# Patient Record
Sex: Male | Born: 1960 | Race: White | Hispanic: No | Marital: Single
Health system: Southern US, Academic
[De-identification: ages and names within clinical notes are randomized; demographics above are authoritative.]

## PROBLEM LIST (undated history)

## (undated) ENCOUNTER — Encounter
Attending: Student in an Organized Health Care Education/Training Program | Primary: Student in an Organized Health Care Education/Training Program

## (undated) ENCOUNTER — Ambulatory Visit: Payer: PRIVATE HEALTH INSURANCE | Attending: Adult Health | Primary: Adult Health

## (undated) ENCOUNTER — Ambulatory Visit

## (undated) ENCOUNTER — Encounter: Attending: Hematology & Oncology | Primary: Hematology & Oncology

## (undated) ENCOUNTER — Encounter

## (undated) ENCOUNTER — Encounter: Attending: Radiation Oncology | Primary: Radiation Oncology

## (undated) ENCOUNTER — Telehealth: Attending: Hematology & Oncology | Primary: Hematology & Oncology

## (undated) ENCOUNTER — Telehealth

## (undated) ENCOUNTER — Ambulatory Visit
Payer: Medicaid (Managed Care) | Attending: Student in an Organized Health Care Education/Training Program | Primary: Student in an Organized Health Care Education/Training Program

## (undated) ENCOUNTER — Encounter: Attending: Internal Medicine | Primary: Internal Medicine

## (undated) ENCOUNTER — Ambulatory Visit: Payer: PRIVATE HEALTH INSURANCE

## (undated) ENCOUNTER — Ambulatory Visit: Payer: Medicaid (Managed Care) | Attending: Hematology & Oncology | Primary: Hematology & Oncology

## (undated) ENCOUNTER — Telehealth
Attending: Student in an Organized Health Care Education/Training Program | Primary: Student in an Organized Health Care Education/Training Program

## (undated) ENCOUNTER — Encounter
Attending: Pharmacist Clinician (PhC)/ Clinical Pharmacy Specialist | Primary: Pharmacist Clinician (PhC)/ Clinical Pharmacy Specialist

## (undated) ENCOUNTER — Ambulatory Visit: Attending: Urology | Primary: Urology

## (undated) ENCOUNTER — Telehealth: Attending: Urology | Primary: Urology

## (undated) ENCOUNTER — Telehealth: Attending: Adult Health | Primary: Adult Health

## (undated) ENCOUNTER — Ambulatory Visit: Payer: PRIVATE HEALTH INSURANCE | Attending: Hematology & Oncology | Primary: Hematology & Oncology

## (undated) ENCOUNTER — Ambulatory Visit: Payer: MEDICAID

## (undated) ENCOUNTER — Ambulatory Visit: Payer: Medicaid (Managed Care)

## (undated) ENCOUNTER — Ambulatory Visit
Payer: PRIVATE HEALTH INSURANCE | Attending: Student in an Organized Health Care Education/Training Program | Primary: Student in an Organized Health Care Education/Training Program

## (undated) DIAGNOSIS — Z87442 Personal history of urinary calculi: Secondary | ICD-10-CM

## (undated) DIAGNOSIS — I10 Essential (primary) hypertension: Secondary | ICD-10-CM

## (undated) DIAGNOSIS — Z8673 Personal history of transient ischemic attack (TIA), and cerebral infarction without residual deficits: Secondary | ICD-10-CM

## (undated) DIAGNOSIS — Z85828 Personal history of other malignant neoplasm of skin: Secondary | ICD-10-CM

## (undated) DIAGNOSIS — G936 Cerebral edema: Secondary | ICD-10-CM

## (undated) DIAGNOSIS — I6521 Occlusion and stenosis of right carotid artery: Secondary | ICD-10-CM

---
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
## 2003-08-22 ENCOUNTER — Encounter: Payer: Self-pay | Admitting: Family Medicine

## 2003-08-22 ENCOUNTER — Encounter: Payer: Self-pay | Admitting: Emergency Medicine

## 2003-08-22 ENCOUNTER — Emergency Department (HOSPITAL_COMMUNITY): Admission: EM | Admit: 2003-08-22 | Discharge: 2003-08-22 | Payer: Self-pay | Admitting: Emergency Medicine

## 2009-04-07 ENCOUNTER — Emergency Department: Payer: Self-pay | Admitting: Emergency Medicine

## 2015-11-13 DIAGNOSIS — F17200 Nicotine dependence, unspecified, uncomplicated: Secondary | ICD-10-CM | POA: Insufficient documentation

## 2016-01-26 DIAGNOSIS — I6521 Occlusion and stenosis of right carotid artery: Secondary | ICD-10-CM | POA: Insufficient documentation

## 2016-01-27 DIAGNOSIS — Z720 Tobacco use: Secondary | ICD-10-CM | POA: Insufficient documentation

## 2016-01-27 DIAGNOSIS — Z85828 Personal history of other malignant neoplasm of skin: Secondary | ICD-10-CM | POA: Insufficient documentation

## 2016-01-27 DIAGNOSIS — G936 Cerebral edema: Secondary | ICD-10-CM | POA: Insufficient documentation

## 2016-01-27 DIAGNOSIS — Z8679 Personal history of other diseases of the circulatory system: Secondary | ICD-10-CM | POA: Insufficient documentation

## 2016-01-29 DIAGNOSIS — I6521 Occlusion and stenosis of right carotid artery: Secondary | ICD-10-CM

## 2016-01-29 DIAGNOSIS — Z8673 Personal history of transient ischemic attack (TIA), and cerebral infarction without residual deficits: Secondary | ICD-10-CM

## 2016-01-29 HISTORY — DX: Occlusion and stenosis of right carotid artery: I65.21

## 2016-01-29 HISTORY — DX: Personal history of transient ischemic attack (TIA), and cerebral infarction without residual deficits: Z86.73

## 2016-01-31 DIAGNOSIS — Z87442 Personal history of urinary calculi: Secondary | ICD-10-CM | POA: Insufficient documentation

## 2016-02-05 ENCOUNTER — Emergency Department
Admission: EM | Admit: 2016-02-05 | Discharge: 2016-02-06 | Disposition: A | Discharge status: Home / Self Care | Payer: Medicaid Other | Attending: Emergency Medicine | Admitting: Emergency Medicine

## 2016-02-05 DIAGNOSIS — F4324 Adjustment disorder with disturbance of conduct: Secondary | ICD-10-CM | POA: Diagnosis not present

## 2016-02-05 DIAGNOSIS — F1110 Opioid abuse, uncomplicated: Secondary | ICD-10-CM | POA: Diagnosis not present

## 2016-02-05 DIAGNOSIS — Z7982 Long term (current) use of aspirin: Secondary | ICD-10-CM | POA: Diagnosis not present

## 2016-02-05 DIAGNOSIS — Z79899 Other long term (current) drug therapy: Secondary | ICD-10-CM | POA: Insufficient documentation

## 2016-02-05 DIAGNOSIS — G8929 Other chronic pain: Secondary | ICD-10-CM | POA: Diagnosis not present

## 2016-02-05 DIAGNOSIS — R4689 Other symptoms and signs involving appearance and behavior: Secondary | ICD-10-CM

## 2016-02-05 DIAGNOSIS — I10 Essential (primary) hypertension: Secondary | ICD-10-CM | POA: Diagnosis not present

## 2016-02-05 DIAGNOSIS — R109 Unspecified abdominal pain: Secondary | ICD-10-CM

## 2016-02-05 DIAGNOSIS — Z7902 Long term (current) use of antithrombotics/antiplatelets: Secondary | ICD-10-CM | POA: Insufficient documentation

## 2016-02-05 DIAGNOSIS — R4182 Altered mental status, unspecified: Secondary | ICD-10-CM | POA: Diagnosis present

## 2016-02-05 DIAGNOSIS — R456 Violent behavior: Secondary | ICD-10-CM

## 2016-02-05 DIAGNOSIS — Z046 Encounter for general psychiatric examination, requested by authority: Secondary | ICD-10-CM

## 2016-02-05 DIAGNOSIS — Z87891 Personal history of nicotine dependence: Secondary | ICD-10-CM | POA: Insufficient documentation

## 2016-02-05 DIAGNOSIS — K5900 Constipation, unspecified: Secondary | ICD-10-CM | POA: Insufficient documentation

## 2016-02-05 DIAGNOSIS — F1210 Cannabis abuse, uncomplicated: Secondary | ICD-10-CM

## 2016-02-05 DIAGNOSIS — F911 Conduct disorder, childhood-onset type: Secondary | ICD-10-CM | POA: Insufficient documentation

## 2016-02-05 DIAGNOSIS — M109 Gout, unspecified: Secondary | ICD-10-CM

## 2016-02-05 HISTORY — DX: Occlusion and stenosis of right carotid artery: I65.21

## 2016-02-05 HISTORY — DX: Cerebral edema: G93.6

## 2016-02-05 HISTORY — DX: Personal history of urinary calculi: Z87.442

## 2016-02-05 HISTORY — DX: Essential (primary) hypertension: I10

## 2016-02-05 HISTORY — DX: Personal history of other malignant neoplasm of skin: Z85.828

## 2016-02-05 HISTORY — DX: Personal history of transient ischemic attack (TIA), and cerebral infarction without residual deficits: Z86.73

## 2016-02-05 NOTE — ED Notes (Signed)
Pt brought in under IVC for aggressive behavior denies any SI or HI pt also has a lot of medical complaints was discharged from [facility] 2 days ago due to CVA.

## 2016-02-06 ENCOUNTER — Emergency Department: Payer: Medicaid Other

## 2016-02-06 ENCOUNTER — Encounter: Payer: Self-pay | Admitting: Emergency Medicine

## 2016-02-06 DIAGNOSIS — C4490 Unspecified malignant neoplasm of skin, unspecified: Secondary | ICD-10-CM | POA: Insufficient documentation

## 2016-02-06 DIAGNOSIS — F419 Anxiety disorder, unspecified: Secondary | ICD-10-CM | POA: Insufficient documentation

## 2016-02-06 DIAGNOSIS — I63231 Cerebral infarction due to unspecified occlusion or stenosis of right carotid arteries: Secondary | ICD-10-CM | POA: Insufficient documentation

## 2016-02-06 DIAGNOSIS — R456 Violent behavior: Secondary | ICD-10-CM

## 2016-02-06 DIAGNOSIS — R109 Unspecified abdominal pain: Secondary | ICD-10-CM

## 2016-02-06 DIAGNOSIS — F1210 Cannabis abuse, uncomplicated: Secondary | ICD-10-CM

## 2016-02-06 DIAGNOSIS — F4324 Adjustment disorder with disturbance of conduct: Secondary | ICD-10-CM

## 2016-02-06 DIAGNOSIS — M109 Gout, unspecified: Secondary | ICD-10-CM

## 2016-02-06 LAB — URINE DRUG SCREEN, QUALITATIVE (ARMC ONLY)
Amphetamines, Ur Screen: NOT DETECTED
Barbiturates, Ur Screen: NOT DETECTED
Benzodiazepine, Ur Scrn: NOT DETECTED
COCAINE METABOLITE, UR ~~LOC~~: NOT DETECTED
Cannabinoid 50 Ng, Ur ~~LOC~~: POSITIVE — AB
MDMA (ECSTASY) UR SCREEN: NOT DETECTED
METHADONE SCREEN, URINE: NOT DETECTED
OPIATE, UR SCREEN: POSITIVE — AB
Phencyclidine (PCP) Ur S: NOT DETECTED
TRICYCLIC, UR SCREEN: NOT DETECTED

## 2016-02-06 LAB — CBC WITH DIFFERENTIAL/PLATELET
BASOS PCT: 0 %
Basophils Absolute: 0.1 10*3/uL (ref 0–0.1)
EOS ABS: 0.1 10*3/uL (ref 0–0.7)
EOS PCT: 1 %
HCT: 35.4 % (ref 35.0–47.0)
Hemoglobin: 12.2 g/dL (ref 12.0–16.0)
Lymphocytes Relative: 10 %
Lymphs Abs: 1.8 10*3/uL (ref 1.0–3.6)
MCH: 30.5 pg (ref 26.0–34.0)
MCHC: 34.5 g/dL (ref 32.0–36.0)
MCV: 88.4 fL (ref 80.0–100.0)
MONO ABS: 1.5 10*3/uL — AB (ref 0.2–0.9)
MONOS PCT: 9 %
NEUTROS PCT: 80 %
Neutro Abs: 14.2 10*3/uL — ABNORMAL HIGH (ref 1.4–6.5)
PLATELETS: 537 10*3/uL — AB (ref 150–440)
RBC: 4.01 MIL/uL (ref 3.80–5.20)
RDW: 13.3 % (ref 11.5–14.5)
WBC: 17.6 10*3/uL — ABNORMAL HIGH (ref 3.6–11.0)

## 2016-02-06 LAB — URINALYSIS COMPLETE WITH MICROSCOPIC (ARMC ONLY)
Bacteria, UA: NONE SEEN
Bilirubin Urine: NEGATIVE
GLUCOSE, UA: NEGATIVE mg/dL
Hgb urine dipstick: NEGATIVE
KETONES UR: NEGATIVE mg/dL
Leukocytes, UA: NEGATIVE
NITRITE: NEGATIVE
Protein, ur: NEGATIVE mg/dL
SPECIFIC GRAVITY, URINE: 1.01 (ref 1.005–1.030)
pH: 8 (ref 5.0–8.0)

## 2016-02-06 LAB — COMPREHENSIVE METABOLIC PANEL
ALK PHOS: 69 U/L (ref 38–126)
ALT: 38 U/L (ref 14–54)
AST: 30 U/L (ref 15–41)
Albumin: 3.5 g/dL (ref 3.5–5.0)
Anion gap: 10 (ref 5–15)
BUN: 16 mg/dL (ref 6–20)
CALCIUM: 9.7 mg/dL (ref 8.9–10.3)
CO2: 28 mmol/L (ref 22–32)
CREATININE: 0.65 mg/dL (ref 0.44–1.00)
Chloride: 93 mmol/L — ABNORMAL LOW (ref 101–111)
Glucose, Bld: 121 mg/dL — ABNORMAL HIGH (ref 65–99)
Potassium: 4.2 mmol/L (ref 3.5–5.1)
Sodium: 131 mmol/L — ABNORMAL LOW (ref 135–145)
Total Bilirubin: 0.6 mg/dL (ref 0.3–1.2)
Total Protein: 7.4 g/dL (ref 6.5–8.1)

## 2016-02-06 LAB — SALICYLATE LEVEL

## 2016-02-06 LAB — ACETAMINOPHEN LEVEL: Acetaminophen (Tylenol), Serum: <10 ug/mL — ABNORMAL LOW (ref 10–30)

## 2016-02-06 MED ORDER — COLCHICINE 0.6 MG PO TABS
0.6000 mg | ORAL_TABLET | Freq: Every day | ORAL | Status: DC | PRN
  Administered 2016-02-06: 0.6 mg via ORAL
  Filled 2016-02-06: qty 1

## 2016-02-06 MED ORDER — COLCHICINE 0.6 MG PO TABS
1.2000 mg | ORAL_TABLET | Freq: Every day | ORAL | Status: DC | PRN
  Administered 2016-02-06: 1.2 mg via ORAL
  Filled 2016-02-06: qty 2

## 2016-02-06 MED ORDER — TAMSULOSIN HCL 0.4 MG PO CAPS
0.4000 mg | ORAL_CAPSULE | Freq: Every day | ORAL | Status: DC
  Administered 2016-02-06: 0.4 mg via ORAL
  Filled 2016-02-06: qty 1

## 2016-02-06 MED ORDER — ACETAMINOPHEN 500 MG PO TABS
1000.0000 mg | ORAL_TABLET | Freq: Four times a day (QID) | ORAL | Status: DC | PRN
  Administered 2016-02-06: 1000 mg via ORAL
  Filled 2016-02-06: qty 2

## 2016-02-06 MED ORDER — CLOPIDOGREL BISULFATE 75 MG PO TABS
75.0000 mg | ORAL_TABLET | Freq: Every day | ORAL | Status: DC
  Administered 2016-02-06: 75 mg via ORAL
  Filled 2016-02-06: qty 1

## 2016-02-06 MED ORDER — COLCHICINE 0.6 MG PO TABS: 0.6000 mg | ORAL_TABLET | ORAL | Status: DC

## 2016-02-06 MED ORDER — DOCUSATE SODIUM 100 MG PO CAPS
100.0000 mg | ORAL_CAPSULE | Freq: Two times a day (BID) | ORAL | Status: DC
  Administered 2016-02-06: 100 mg via ORAL
  Filled 2016-02-06: qty 1

## 2016-02-06 MED ORDER — ATORVASTATIN CALCIUM 10 MG PO TABS
5.0000 mg | ORAL_TABLET | Freq: Every day | ORAL | Status: DC
  Administered 2016-02-06: 5 mg via ORAL
  Filled 2016-02-06: qty 0.5

## 2016-02-06 MED ORDER — ASPIRIN EC 81 MG PO TBEC
81.0000 mg | DELAYED_RELEASE_TABLET | Freq: Every day | ORAL | Status: DC
  Administered 2016-02-06: 81 mg via ORAL
  Filled 2016-02-06: qty 1
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
## 2016-02-06 NOTE — ED Notes (Signed)
Report received - I can hear him yelling while in report "I need something for pain - hurry up and get me something now."

## 2016-02-06 NOTE — ED Notes (Signed)
Pt to discharge to home  Discharge instructions reviewed with him and he verbalized agreement and understanding
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
## 2016-02-06 NOTE — ED Notes (Signed)
ED BHU [place] Is the patient under IVC or is there intent for IVC: Yes.   Is the patient medically cleared: Yes.   Is there vacancy in the ED BHU: Yes.   Is the population mix appropriate for patient: Yes.   Is the patient awaiting placement in inpatient or outpatient setting:  Has the patient had a psychiatric consult: Yes.  - consult complete - pt to be discharged   Survey of unit performed for contraband, proper placement and condition of furniture, tampering with fixtures in bathroom, shower, and each patient room: Yes.  ; Findings:  APPEARANCE/BEHAVIOR Calm and cooperative NEURO ASSESSMENT Orientation: oriented x3  Denies pain Hallucinations: No.None noted (Hallucinations) Speech: Normal Gait: normal RESPIRATORY ASSESSMENT Even  Unlabored respirations  CARDIOVASCULAR ASSESSMENT Pulses equal   regular rate  Skin warm and dry   GASTROINTESTINAL ASSESSMENT no GI complaint EXTREMITIES Full ROM  PLAN OF CARE Provide calm/safe environment. Vital signs assessed twice daily. ED BHU Assessment once each 12-hour shift. Collaborate with intake RN daily or as condition indicates. Assure the ED provider has rounded once each shift. Provide and encourage hygiene. Provide redirection as needed. Assess for escalating behavior; address immediately and inform ED provider.  Assess family dynamic and appropriateness for visitation as needed: Yes.  ; If necessary, describe findings:  Educate the patient/family about BHU procedures/visitation: Yes.  ; If necessary, describe findings:

## 2016-02-06 NOTE — ED Notes (Signed)
BEHAVIORAL HEALTH ROUNDING Patient sleeping: Yes.   Patient alert and oriented: eyes closed  Appears asleep Behavior appropriate: Yes.  ; If no, describe:  Nutrition and fluids offered: Yes  Toileting and hygiene offered: sleeping Sitter present: q 15 minute observations and security monitoring Law enforcement present: yes  ODS 
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
## 2016-02-06 NOTE — ED Notes (Signed)

## 2016-02-06 NOTE — ED Notes (Signed)
Am meds administered as ordered  Assessment completed   

## 2016-02-06 NOTE — ED Notes (Signed)
Lunch provided along with an extra drink  Pt observed with no unusual behavior  Appropriate to stimulation  No verbalized needs or concerns at this time  NAD assessed  Continue to monitor 

## 2016-02-06 NOTE — ED Notes (Signed)
meds administered as ordered  - pt states  "Give me another colchicine for my gout - that first one did not help shit."

## 2016-02-06 NOTE — Discharge Instructions (Signed)
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
## 2016-03-16 DIAGNOSIS — Z8673 Personal history of transient ischemic attack (TIA), and cerebral infarction without residual deficits: Secondary | ICD-10-CM | POA: Insufficient documentation

## 2016-03-16 DIAGNOSIS — E785 Hyperlipidemia, unspecified: Secondary | ICD-10-CM | POA: Insufficient documentation

## 2016-04-21 DIAGNOSIS — K6812 Psoas muscle abscess: Secondary | ICD-10-CM | POA: Insufficient documentation

## 2016-04-21 DIAGNOSIS — M4620 Osteomyelitis of vertebra, site unspecified: Secondary | ICD-10-CM | POA: Insufficient documentation

## 2016-05-09 DIAGNOSIS — M5441 Lumbago with sciatica, right side: Secondary | ICD-10-CM | POA: Insufficient documentation

## 2016-05-13 DIAGNOSIS — E43 Unspecified severe protein-calorie malnutrition: Secondary | ICD-10-CM | POA: Insufficient documentation

## 2017-02-26 ENCOUNTER — Ambulatory Visit
Admission: RE | Admit: 2017-02-26 | Discharge: 2017-02-26 | Disposition: A | Discharge status: Home / Self Care | Payer: Medicaid Other | Source: Ambulatory Visit | Attending: Family Medicine | Admitting: Family Medicine

## 2017-02-26 ENCOUNTER — Other Ambulatory Visit: Payer: Self-pay | Admitting: Family Medicine

## 2017-02-26 DIAGNOSIS — M25511 Pain in right shoulder: Principal | ICD-10-CM

## 2017-02-26 DIAGNOSIS — M85811 Other specified disorders of bone density and structure, right shoulder: Secondary | ICD-10-CM | POA: Insufficient documentation

## 2017-02-26 DIAGNOSIS — G8929 Other chronic pain: Secondary | ICD-10-CM | POA: Diagnosis present

## 2017-02-26 DIAGNOSIS — M19011 Primary osteoarthritis, right shoulder: Secondary | ICD-10-CM | POA: Diagnosis not present

## 2017-06-01 ENCOUNTER — Ambulatory Visit
Admission: RE | Admit: 2017-06-01 | Discharge: 2017-06-01 | Disposition: A | Discharge status: Home / Self Care | Payer: MEDICAID

## 2017-06-01 DIAGNOSIS — R1310 Dysphagia, unspecified: Principal | ICD-10-CM

## 2017-07-28 IMAGING — CT CT RENAL STONE PROTOCOL
1 of 2 series · 15 of 32 positions shown, 19 images · non-contrast
Comparison: None.

CLINICAL DATA: Bilateral flank pain for several days. History of
kidney stones.

EXAM:
CT ABDOMEN AND PELVIS WITHOUT CONTRAST
TECHNIQUE: Multidetector CT imaging of the abdomen and pelvis was performed
following the standard protocol without IV contrast.

[Series 2: stone standard full · axial · 0.62mm/px · z∈[-862,-452]mm · 15 of 90 slices shown, 19 images]
[im 4/90  soft-tissue]
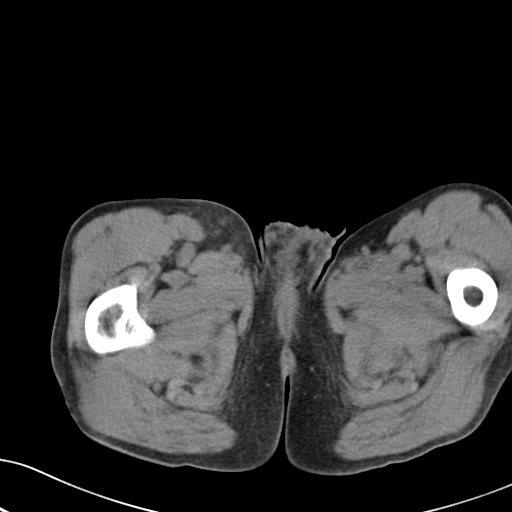
[im 4/90  bone]
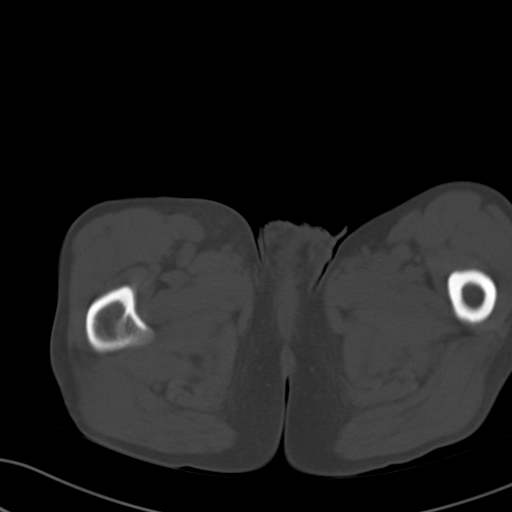
[im 11/90  soft-tissue]
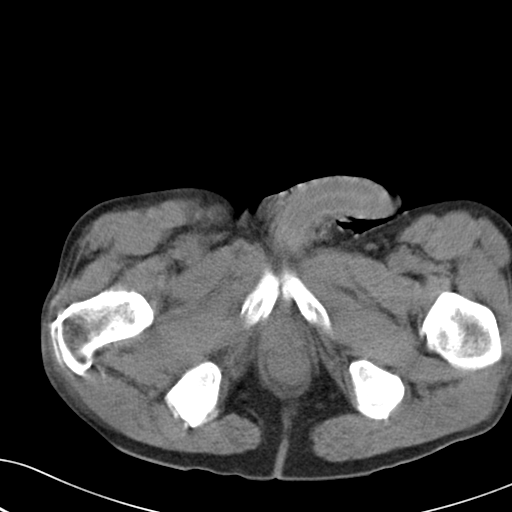
[im 18/90  soft-tissue]
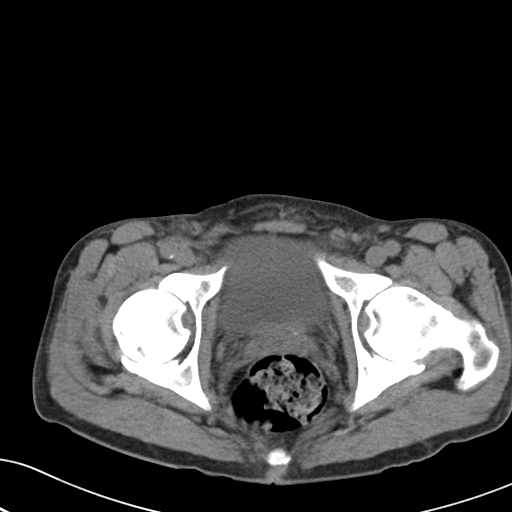
[im 25/90  soft-tissue]
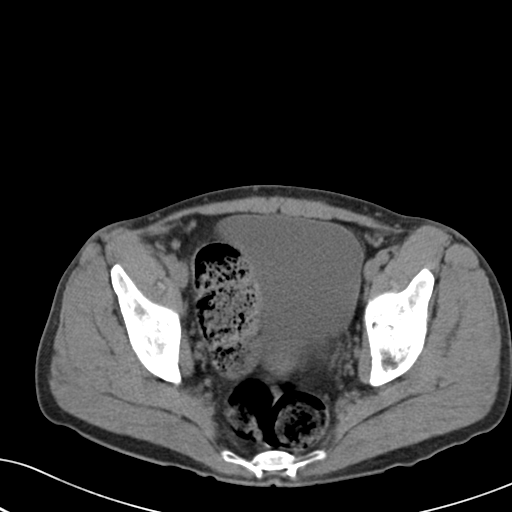
[im 33/90  soft-tissue]
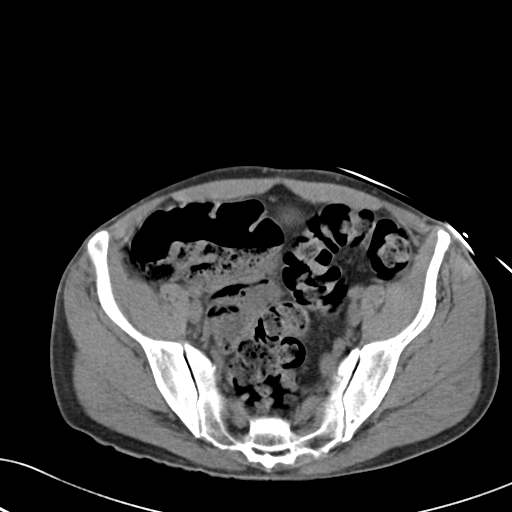
[im 40/90  soft-tissue]
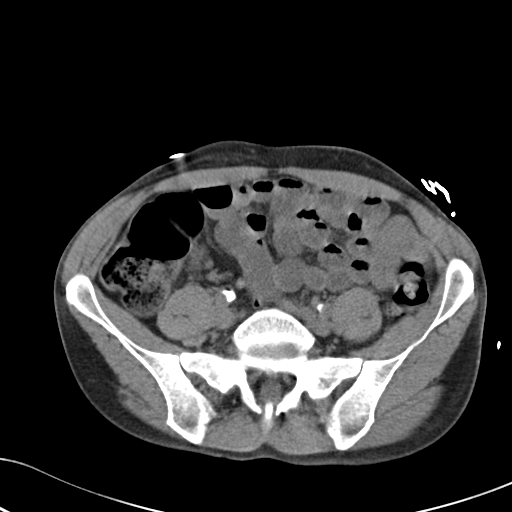
[im 47/90  soft-tissue]
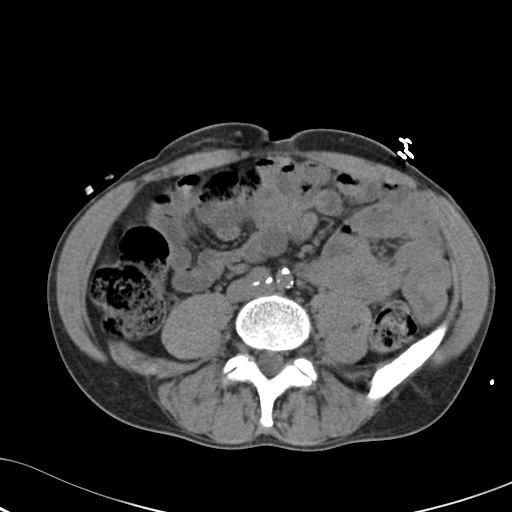
[im 50/90  soft-tissue]
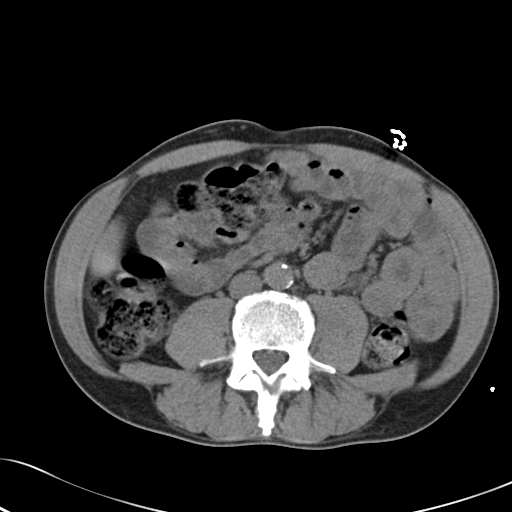
[im 57/90  soft-tissue]
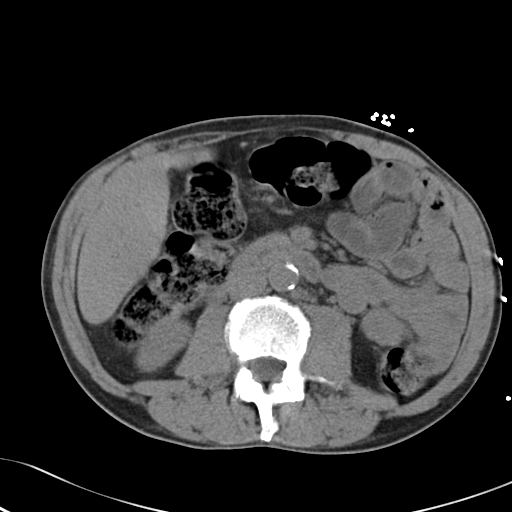
[im 57/90  bone]
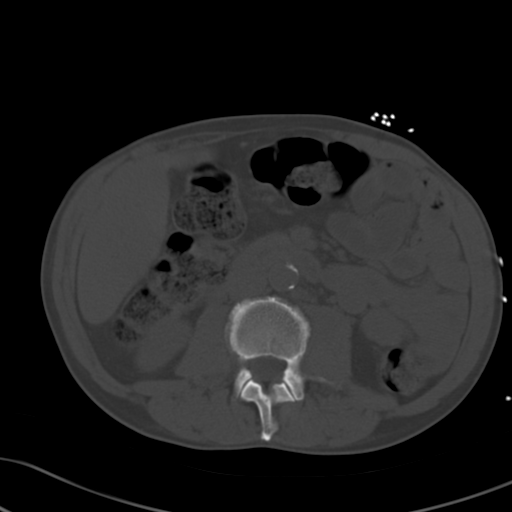
[im 65/90  soft-tissue]
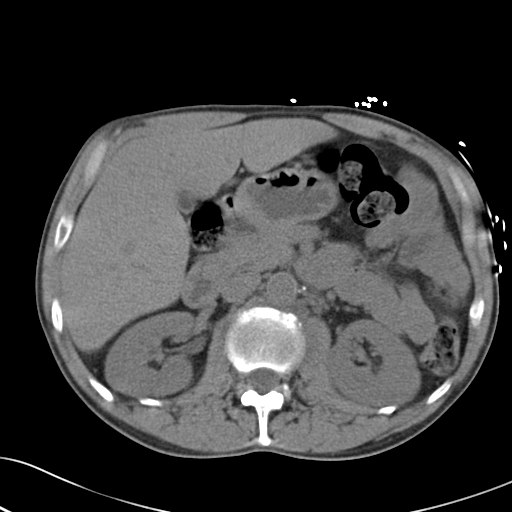
[im 72/90  soft-tissue]
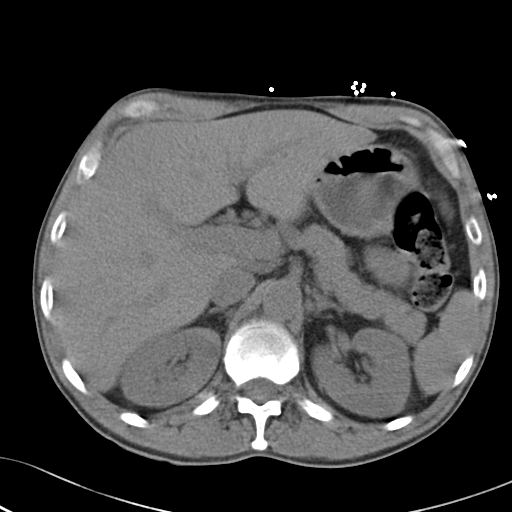
[im 75/90  lung]
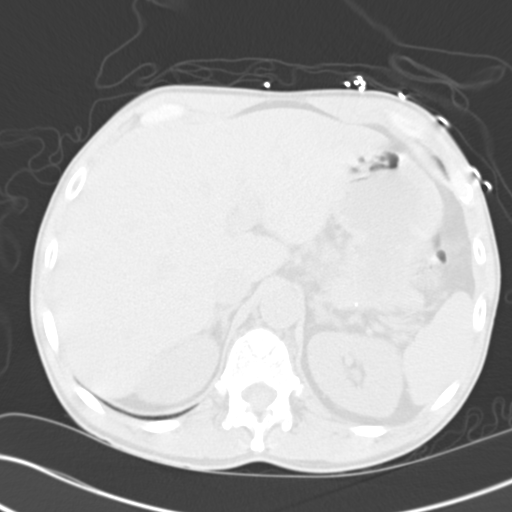
[im 79/90  soft-tissue]
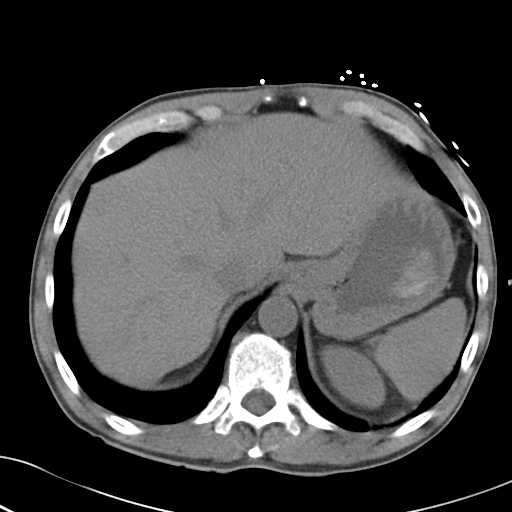
[im 79/90  lung]
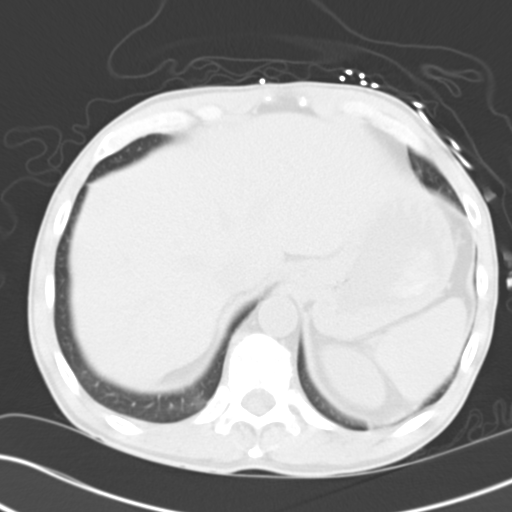
[im 82/90  lung]
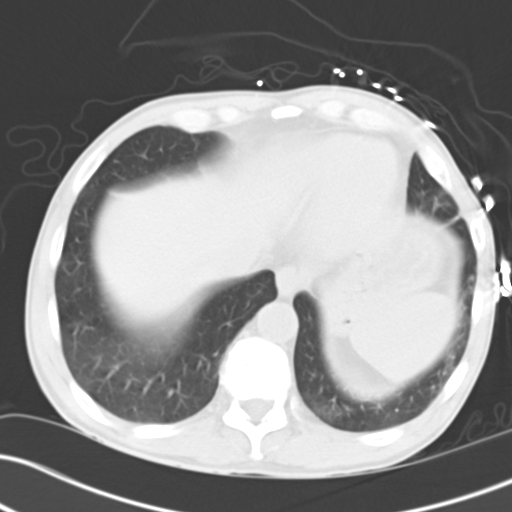
[im 86/90  soft-tissue]
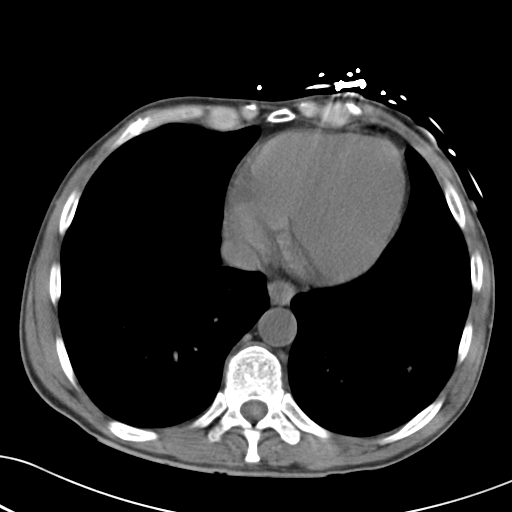
[im 86/90  lung]
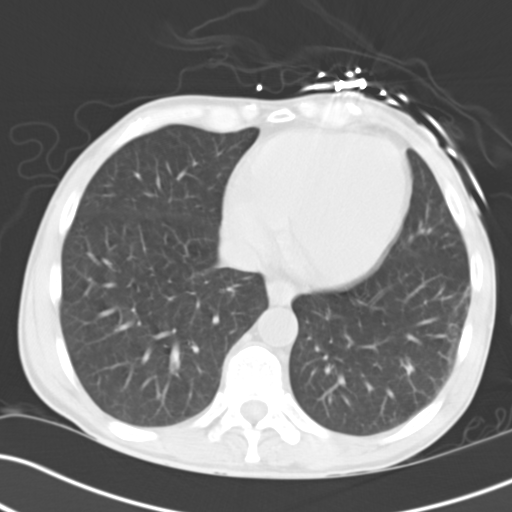

[15 of 32 positions shown; findings below may reference images not displayed]

FINDINGS: Lower chest: Linear subsegmental atelectasis left greater than right
lung base. No pleural effusion.

Liver: No focal lesion allowing for lack contrast.

Hepatobiliary: Decompressed, no calcified stone.

Pancreas: No ductal dilatation or inflammation.

Spleen: Normal.

Adrenal glands: No nodule.

Kidneys: No hydronephrosis or perinephric stranding. There is a
punctate nonobstructing stone in the mid left kidney. No right renal
stones. Both ureters are decompressed without stones along the
course.

Stomach/Bowel: Stomach physiologically distended. There are no
dilated or thickened small bowel loops. Large volume of stool
throughout the entire colon without colonic wall thickening. The
appendix is normal.

Vascular/Lymphatic: No retroperitoneal adenopathy. Abdominal aorta
is normal in caliber. Moderate atherosclerosis of the abdominal
aorta without aneurysm.

Reproductive: Central prostatic calcifications.

Bladder: Physiologically distended. No bladder stone. No bladder
wall thickening.

Other: No free air, free fluid, or intra-abdominal fluid collection.

Musculoskeletal: There are no acute or suspicious osseous
abnormalities. Age related degenerative change in the spine and both
hips, motion artifact through the right proximal femur.
IMPRESSION: 1. Nonobstructing left renal calculus. No hydronephrosis or
obstructive uropathy.
2. Large stool burden throughout the colon, suggesting constipation.

## 2017-09-02 ENCOUNTER — Ambulatory Visit
Admission: RE | Admit: 2017-09-02 | Discharge: 2017-09-02 | Disposition: A | Discharge status: Home / Self Care | Payer: MEDICAID

## 2017-09-02 DIAGNOSIS — M19011 Primary osteoarthritis, right shoulder: Secondary | ICD-10-CM

## 2017-09-02 DIAGNOSIS — M7551 Bursitis of right shoulder: Principal | ICD-10-CM

## 2017-09-02 DIAGNOSIS — G4700 Insomnia, unspecified: Secondary | ICD-10-CM

## 2017-09-02 DIAGNOSIS — I679 Cerebrovascular disease, unspecified: Secondary | ICD-10-CM

## 2017-09-02 DIAGNOSIS — M75101 Unspecified rotator cuff tear or rupture of right shoulder, not specified as traumatic: Secondary | ICD-10-CM

## 2017-09-02 DIAGNOSIS — F419 Anxiety disorder, unspecified: Secondary | ICD-10-CM

## 2017-09-02 DIAGNOSIS — D649 Anemia, unspecified: Secondary | ICD-10-CM

## 2017-09-02 MED ORDER — TRAZODONE 50 MG TABLET
ORAL_TABLET | 3 refills | 0 days | Status: CP
Start: 2017-09-02 — End: 2018-09-22

## 2017-09-02 MED ORDER — ALPRAZOLAM 0.5 MG TABLET
ORAL_TABLET | Freq: Every evening | ORAL | 2 refills | 0 days | Status: CP | PRN
Start: 2017-09-02 — End: 2017-11-12

## 2017-09-02 MED ORDER — MELOXICAM 7.5 MG TABLET
ORAL_TABLET | 3 refills | 0 days | Status: CP
Start: 2017-09-02 — End: 2018-09-22

## 2017-09-02 MED ORDER — ATORVASTATIN 40 MG TABLET
ORAL_TABLET | Freq: Every day | ORAL | 0 refills | 0 days | Status: CP
Start: 2017-09-02 — End: 2018-09-22

## 2017-11-12 MED ORDER — ALPRAZOLAM 0.5 MG TABLET
ORAL_TABLET | Freq: Every evening | ORAL | 2 refills | 0.00000 days | Status: CP | PRN
Start: 2017-11-12 — End: 2018-11-12

## 2017-11-16 ENCOUNTER — Other Ambulatory Visit
Admission: EM | Admit: 2017-11-16 | Discharge: 2017-11-16 | Disposition: A | Discharge status: Home / Self Care | Attending: Family Medicine | Admitting: Family Medicine
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
## 2018-08-18 IMAGING — CR DG SHOULDER 2+V*R*
3 series · 3 of 3 positions shown · non-contrast
Comparison: No recent .

CLINICAL DATA: Chronic pain.  No injury.

EXAM:
RIGHT SHOULDER - 2+ VIEW

[shoulder grashey]
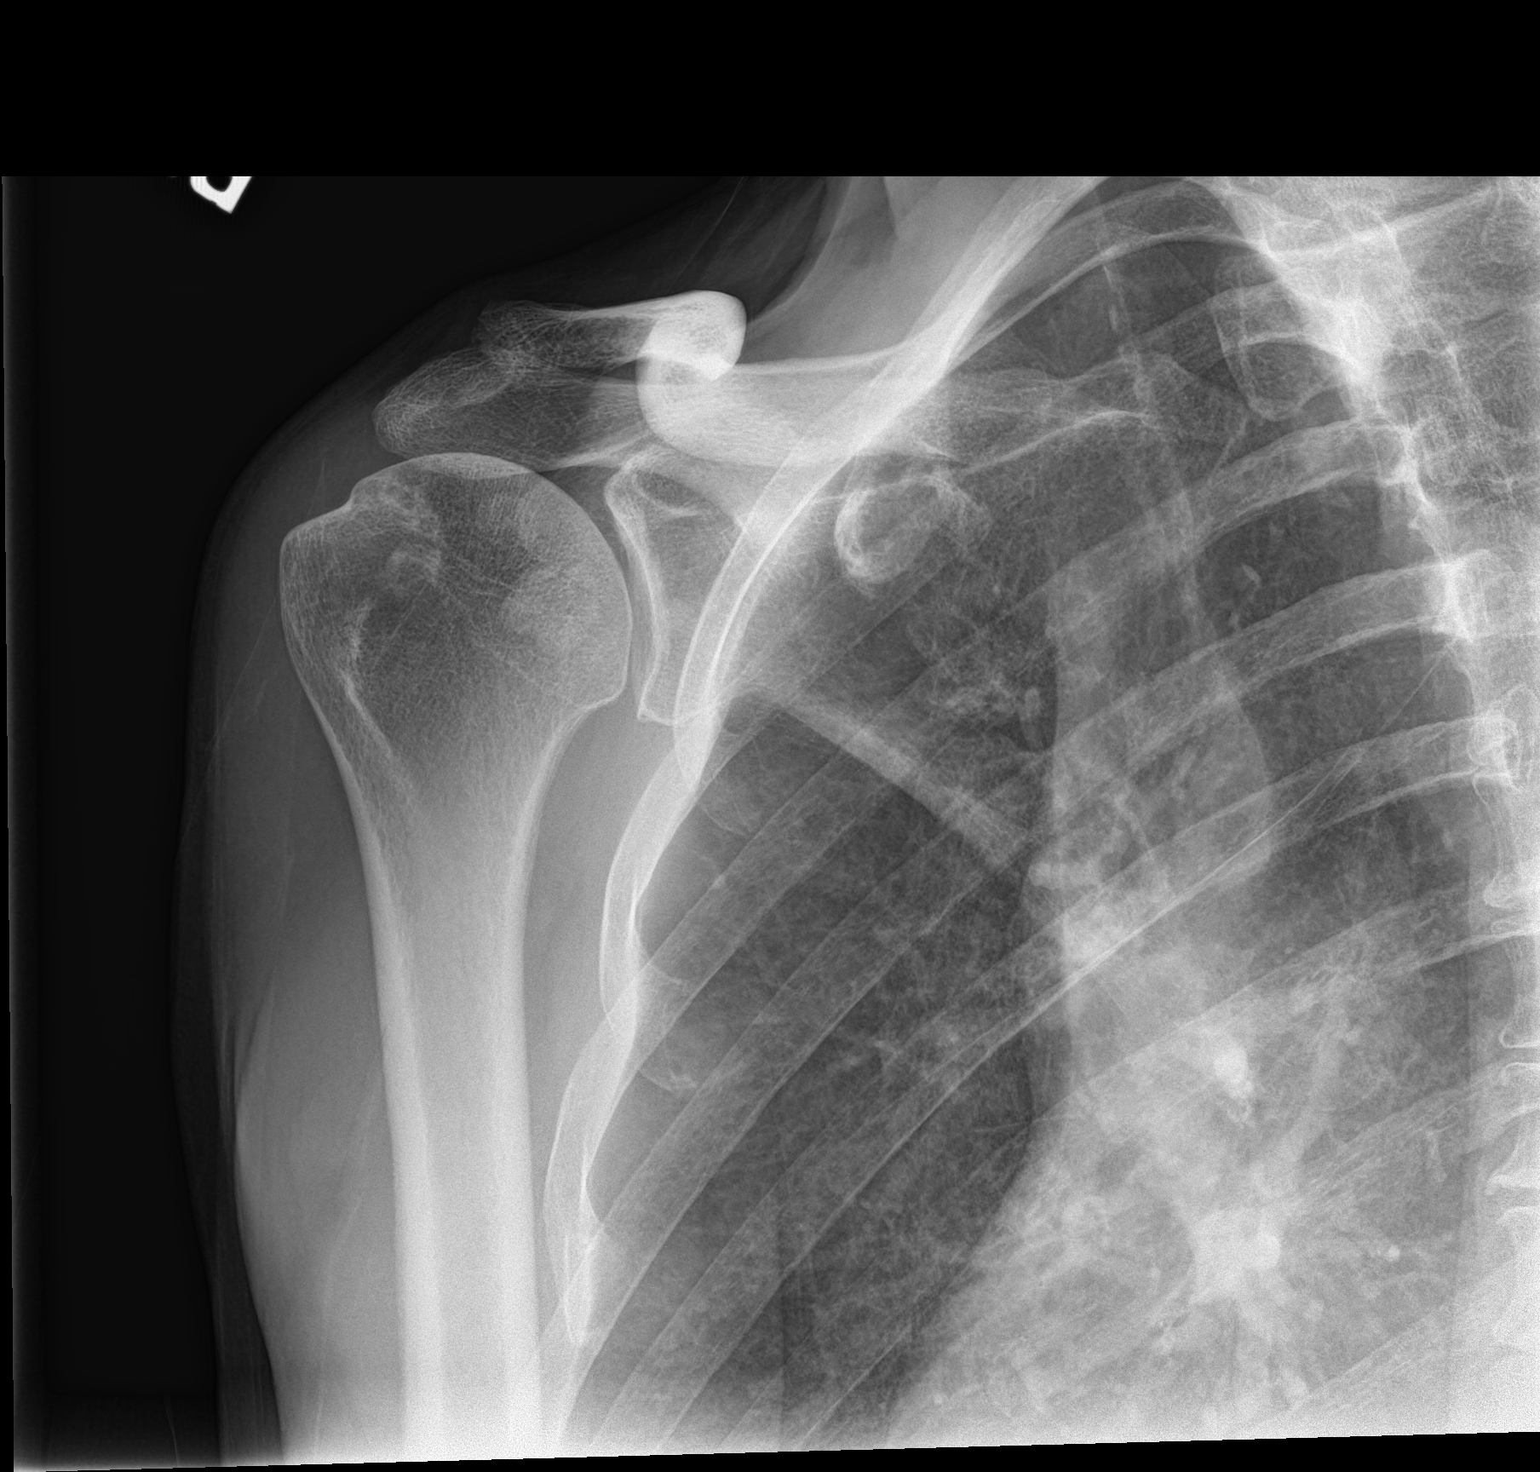

[shoulder y view]
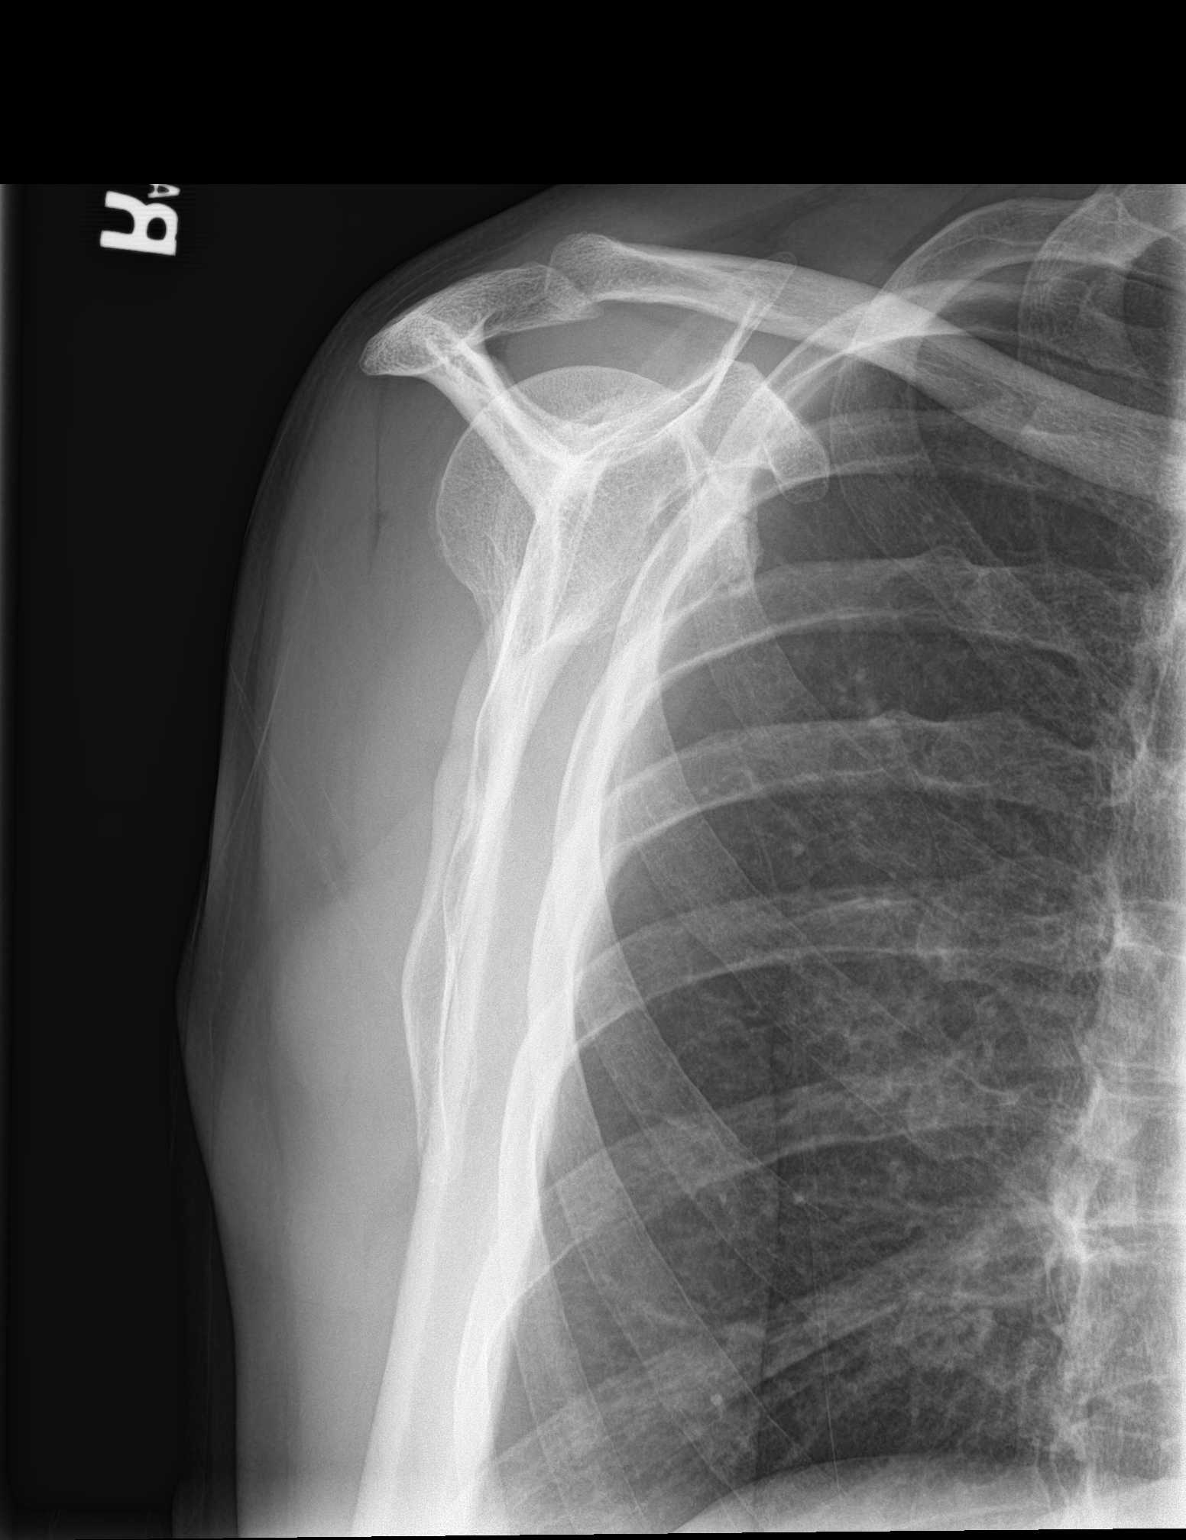

[shoulder axial]
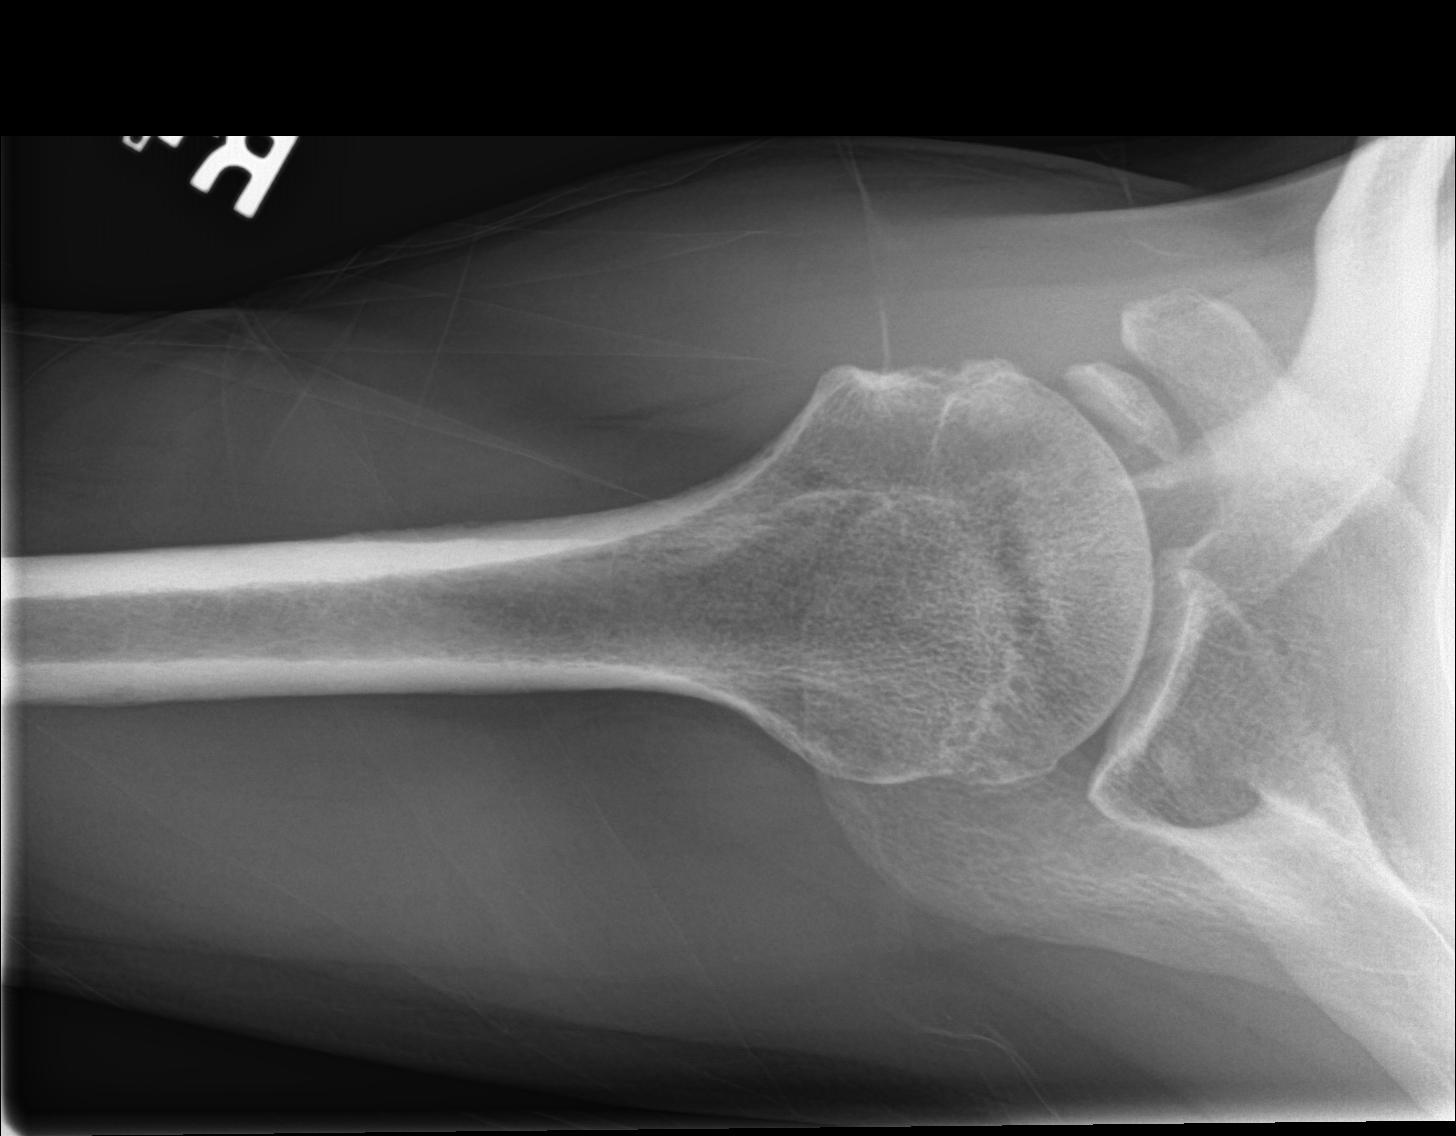

[3 of 3 positions shown; findings below may reference images not displayed]

FINDINGS: No evidence of fracture or dislocation. Prominent density noted
along the anterior aspect of the right shoulder. This could
represent a large loose body. Mild acromioclavicular glenohumeral
degenerative change . Punctate well-circumscribed sclerotic density
in the glenoid most likely tiny bone island. Punctate calcified
nodule right lung consistent with granuloma .
IMPRESSION: 1. Mild acromioclavicular glenohumeral degenerative change.
Prominent density noted along the anterior aspect of the right
shoulder. This could represent a large loose body.

2. No acute abnormality .

## 2018-09-22 ENCOUNTER — Ambulatory Visit: Admit: 2018-09-22 | Discharge: 2018-09-23 | Payer: MEDICAID

## 2018-09-22 DIAGNOSIS — I679 Cerebrovascular disease, unspecified: Secondary | ICD-10-CM

## 2018-09-22 DIAGNOSIS — M7551 Bursitis of right shoulder: Principal | ICD-10-CM

## 2018-09-22 DIAGNOSIS — M542 Cervicalgia: Secondary | ICD-10-CM

## 2018-09-22 DIAGNOSIS — F419 Anxiety disorder, unspecified: Secondary | ICD-10-CM

## 2018-09-22 DIAGNOSIS — G4700 Insomnia, unspecified: Secondary | ICD-10-CM

## 2018-09-22 DIAGNOSIS — M19011 Primary osteoarthritis, right shoulder: Secondary | ICD-10-CM

## 2018-09-22 MED ORDER — TRAZODONE 50 MG TABLET
ORAL_TABLET | 3 refills | 0 days | Status: CP
Start: 2018-09-22 — End: 2019-05-18

## 2018-09-22 MED ORDER — ATORVASTATIN 40 MG TABLET
ORAL_TABLET | Freq: Every day | ORAL | 0 refills | 0.00000 days | Status: CP
Start: 2018-09-22 — End: 2019-05-18

## 2018-09-22 MED ORDER — MELOXICAM 7.5 MG TABLET
ORAL_TABLET | 3 refills | 0 days | Status: CP
Start: 2018-09-22 — End: 2019-05-18

## 2018-09-22 MED ORDER — COLCHICINE 0.6 MG TABLET
ORAL_TABLET | 3 refills | 0 days | Status: CP
Start: 2018-09-22 — End: ?

## 2019-01-20 ENCOUNTER — Ambulatory Visit
Admission: EM | Admit: 2019-01-20 | Discharge: 2019-01-20 | Disposition: A | Discharge status: Home / Self Care | Payer: Medicaid Other | Attending: Family Medicine | Admitting: Family Medicine

## 2019-01-20 ENCOUNTER — Encounter: Payer: Self-pay | Admitting: Emergency Medicine

## 2019-01-20 ENCOUNTER — Other Ambulatory Visit: Payer: Self-pay

## 2019-01-20 DIAGNOSIS — S61217A Laceration without foreign body of left little finger without damage to nail, initial encounter: Secondary | ICD-10-CM

## 2019-01-20 DIAGNOSIS — W268XXA Contact with other sharp object(s), not elsewhere classified, initial encounter: Secondary | ICD-10-CM | POA: Diagnosis not present

## 2019-01-20 MED ORDER — MUPIROCIN 2 % EX OINT: 1.0000 "application " | TOPICAL_OINTMENT | Freq: Three times a day (TID) | CUTANEOUS | 0 refills | Status: AC

## 2019-01-20 NOTE — ED Provider Notes (Addendum)
[facility]    CSN: [id number] Arrival date & time: 01/20/19  1348     History   Chief Complaint Chief Complaint  Patient presents with  . Finger Injury  . Extremity Laceration    left 5th finger    HPI CASSON CATENA is a 58 y.o. male.   HPI  61-year male states that he cut his left volar little finger the proximal fold pushing down trash yesterday around 2 PM.  He states he was cut on the lid of a corn can.  He did not come in to have it repaired. today  he come in to have it wrapped and cleansed.  He refuses having a tetanus shot.            Past Medical History:  Diagnosis Date  . Cytotoxic cerebral edema [facility])    March 2017 after large MCA CVA  . 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 History of kidney stones   . History of skin cancer   . Hypertension   . Right carotid artery occlusion March 2017    Patient Active Problem List   Diagnosis Date Noted  . Adjustment disorder with disturbance of conduct 02/06/2016  . Cannabis use disorder, mild, abuse 02/06/2016  . Gout 02/06/2016  . Acute flank pain 02/06/2016  . Violent behavior 02/06/2016    History reviewed. No pertinent surgical history.     Home Medications    Prior to Admission medications   Medication Sig Start Date End Date Taking? Authorizing Provider  aspirin EC 81 MG tablet Take 81 mg by mouth daily.   Yes [provider]  mupirocin ointment (BACTROBAN) 2 % Apply 1 application topically 3 (three) times daily. 01/20/19   Lorin Picket, PA-C    Family History Family History  Problem Relation Age of Onset  . Healthy Mother     Social History Social History   Tobacco Use  . Smoking status: Current Every Day Smoker    Types: Cigarettes  . Smokeless tobacco: Never Used  Substance Use Topics  . Alcohol use: No  . Drug use: No     Allergies   Patient has no known allergies.   Review of Systems Review of Systems  Constitutional: Positive for  activity change. Negative for appetite change, chills, fatigue and fever.  Skin: Positive for wound.  All other systems reviewed and are negative.    Physical Exam Triage Vital Signs ED Triage Vitals  Enc Vitals Group     BP 01/20/19 1412 123/74     Pulse Rate 01/20/19 1412 73     Resp 01/20/19 1412 16     Temp 01/20/19 1412 97.6 F (36.4 C)     Temp Source 01/20/19 1412 Oral     SpO2 01/20/19 1412 96 %     Weight 01/20/19 1408 150 lb (68 kg)     Height 01/20/19 1408 5\' 6"  (1.676 m)     Head Circumference --      Peak Flow --      Pain Score --      Pain Loc --      Pain Edu? --      Excl. in [place]? --    No data found.  Updated Vital Signs BP 123/74 (BP Location: Left Arm)   Pulse 73   Temp 97.6 F (36.4 C) (Oral)   Resp 16   Ht 5\' 6"  (1.676 m)   Wt 150 lb (68 kg)  SpO2 96%   BMI 24.21 kg/m   Visual Acuity Right Eye Distance:   Left Eye Distance:   Bilateral Distance:    Right Eye Near:   Left Eye Near:    Bilateral Near:     Physical Exam Vitals signs and nursing note reviewed.  Constitutional:      General: He is not in acute distress.    Appearance: Normal appearance. He is not ill-appearing, toxic-appearing or diaphoretic.  HENT:     Head: Normocephalic and atraumatic.     Nose: Nose normal.     Mouth/Throat:     Mouth: Mucous membranes are moist.     Pharynx: Oropharynx is clear.  Eyes:     General:        Right eye: No discharge.        Left eye: No discharge.     Conjunctiva/sclera: Conjunctivae normal.  Neck:     Musculoskeletal: Normal range of motion and neck supple.  Musculoskeletal: Normal range of motion.        General: Tenderness and signs of injury present.     Comments: Examination of the nondominant left hand shows a transverse laceration extending approximately 1 cm over the volar aspect overlying the proximal flexion crease. No Bleeding at the present time.  Has good sensation distally he has strong flexion of the FDS and FDP  against resistance.  Skin:    General: Skin is warm and dry.  Neurological:     General: No focal deficit present.     Mental Status: He is alert and oriented to person, place, and time.  Psychiatric:        Mood and Affect: Mood normal.        Behavior: Behavior normal.        Thought Content: Thought content normal.        Judgment: Judgment normal.      UC Treatments / Results  Labs (all labs ordered are listed, but only abnormal results are displayed) Labs Reviewed - No data to display  EKG None  Radiology No results found.  Procedures Procedures (including critical care time)  Medications Ordered in UC Medications - No data to display  Initial Impression / Assessment and Plan / UC Course  I have reviewed the triage vital signs and the nursing notes.  Pertinent labs & imaging results that were available during my care of the patient were reviewed by me and considered in my medical decision making (see chart for details).   Explained to the patient that has been too much time elapsed to close the wound.  He will have to heal by secondary intention.  Did refuse a tetanus toxoid shot.  Wound was dressed after thorough cleansing.  I will prescribe Bactroban ointment to prevent secondary infection that he will apply 3 times a day until the skin has healed.   Final Clinical Impressions(s) / UC Diagnoses   Final diagnoses:  Laceration of left little finger without foreign body without damage to nail, initial encounter   Discharge Instructions   None    ED Prescriptions    Medication Sig Dispense Auth. Provider   mupirocin ointment (BACTROBAN) 2 % Apply 1 application topically 3 (three) times daily. 22 g Lorin Picket, PA-C     Controlled Substance Prescriptions [place] Controlled Substance Registry consulted? Not Applicable   Lorin Picket, PA-C 01/20/19 1502    Lorin Picket, PA-C 01/20/19 [id number]

## 2019-01-20 NOTE — ED Notes (Signed)
Patient refused tetanus vaccine today.

## 2019-01-20 NOTE — ED Triage Notes (Signed)
Patient states that he got cut by the metal lid of a can while pushing the trash down yesterday around 2pm.  Patient has a laceration to his left 5th finger.

## 2019-05-18 ENCOUNTER — Ambulatory Visit: Admit: 2019-05-18 | Discharge: 2019-05-19 | Payer: MEDICAID

## 2019-05-18 DIAGNOSIS — I679 Cerebrovascular disease, unspecified: Secondary | ICD-10-CM

## 2019-05-18 DIAGNOSIS — Z1159 Encounter for screening for other viral diseases: Secondary | ICD-10-CM

## 2019-05-18 DIAGNOSIS — M542 Cervicalgia: Principal | ICD-10-CM

## 2019-05-18 DIAGNOSIS — Z1211 Encounter for screening for malignant neoplasm of colon: Secondary | ICD-10-CM

## 2019-05-18 DIAGNOSIS — G4700 Insomnia, unspecified: Secondary | ICD-10-CM

## 2019-05-18 DIAGNOSIS — M19011 Primary osteoarthritis, right shoulder: Secondary | ICD-10-CM

## 2019-05-18 MED ORDER — TRAZODONE 50 MG TABLET
ORAL_TABLET | 3 refills | 0 days | Status: CP
Start: 2019-05-18 — End: ?

## 2019-05-18 MED ORDER — MELOXICAM 7.5 MG TABLET
ORAL_TABLET | 3 refills | 0 days | Status: CP
Start: 2019-05-18 — End: 2020-06-06

## 2019-05-18 MED ORDER — ASPIRIN 81 MG TABLET,DELAYED RELEASE
ORAL_TABLET | Freq: Every day | ORAL | 3 refills | 0.00000 days | Status: CP
Start: 2019-05-18 — End: ?

## 2019-05-18 MED ORDER — ATORVASTATIN 40 MG TABLET
ORAL_TABLET | Freq: Every day | ORAL | 0 refills | 0 days | Status: CP
Start: 2019-05-18 — End: 2020-05-17

## 2021-07-08 ENCOUNTER — Ambulatory Visit: Admit: 2021-07-08 | Discharge: 2021-07-09 | Payer: MEDICAID

## 2021-07-08 DIAGNOSIS — F1911 Other psychoactive substance abuse, in remission: Secondary | ICD-10-CM | POA: Insufficient documentation

## 2021-07-08 DIAGNOSIS — L989 Disorder of the skin and subcutaneous tissue, unspecified: Principal | ICD-10-CM

## 2021-07-08 DIAGNOSIS — M542 Cervicalgia: Principal | ICD-10-CM

## 2021-07-08 DIAGNOSIS — F419 Anxiety disorder, unspecified: Principal | ICD-10-CM

## 2021-07-08 DIAGNOSIS — E785 Hyperlipidemia, unspecified: Principal | ICD-10-CM

## 2021-07-08 DIAGNOSIS — I679 Cerebrovascular disease, unspecified: Principal | ICD-10-CM

## 2021-07-08 MED ORDER — SERTRALINE 50 MG TABLET
ORAL_TABLET | Freq: Every day | ORAL | 3 refills | 90 days | Status: CP
Start: 2021-07-08 — End: 2022-07-08

## 2021-07-08 MED ORDER — ATORVASTATIN 40 MG TABLET
ORAL_TABLET | Freq: Every day | ORAL | 3 refills | 90.00000 days | Status: CP
Start: 2021-07-08 — End: 2022-07-08

## 2021-07-17 ENCOUNTER — Ambulatory Visit: Admit: 2021-07-17 | Discharge: 2021-07-18 | Payer: MEDICAID

## 2021-07-17 DIAGNOSIS — L0291 Cutaneous abscess, unspecified: Principal | ICD-10-CM

## 2021-07-17 MED ORDER — SULFAMETHOXAZOLE 800 MG-TRIMETHOPRIM 160 MG TABLET
ORAL_TABLET | Freq: Two times a day (BID) | ORAL | 0 refills | 7 days | Status: CP
Start: 2021-07-17 — End: ?

## 2021-08-05 ENCOUNTER — Ambulatory Visit: Admit: 2021-08-05 | Discharge: 2021-08-06 | Payer: MEDICAID

## 2021-08-05 DIAGNOSIS — E785 Hyperlipidemia, unspecified: Principal | ICD-10-CM

## 2021-08-05 DIAGNOSIS — I63231 Cerebral infarction due to unspecified occlusion or stenosis of right carotid arteries: Principal | ICD-10-CM

## 2021-08-05 DIAGNOSIS — Z7185 Vaccine counseling: Principal | ICD-10-CM

## 2021-08-05 DIAGNOSIS — F17200 Nicotine dependence, unspecified, uncomplicated: Principal | ICD-10-CM

## 2021-08-05 DIAGNOSIS — M542 Cervicalgia: Principal | ICD-10-CM

## 2021-09-01 ENCOUNTER — Ambulatory Visit
Admit: 2021-09-01 | Discharge: 2021-09-02 | Payer: MEDICAID | Attending: Student in an Organized Health Care Education/Training Program | Primary: Student in an Organized Health Care Education/Training Program

## 2021-09-01 DIAGNOSIS — D229 Melanocytic nevi, unspecified: Principal | ICD-10-CM

## 2021-09-01 DIAGNOSIS — L989 Disorder of the skin and subcutaneous tissue, unspecified: Principal | ICD-10-CM

## 2021-09-01 DIAGNOSIS — L821 Other seborrheic keratosis: Principal | ICD-10-CM

## 2021-09-01 DIAGNOSIS — L814 Other melanin hyperpigmentation: Principal | ICD-10-CM

## 2021-09-01 DIAGNOSIS — D492 Neoplasm of unspecified behavior of bone, soft tissue, and skin: Principal | ICD-10-CM

## 2021-09-03 DIAGNOSIS — D492 Neoplasm of unspecified behavior of bone, soft tissue, and skin: Principal | ICD-10-CM

## 2021-10-07 DIAGNOSIS — M542 Cervicalgia: Principal | ICD-10-CM

## 2021-10-09 ENCOUNTER — Ambulatory Visit: Admit: 2021-10-09 | Discharge: 2021-10-10 | Payer: MEDICAID

## 2021-10-09 DIAGNOSIS — C44319 Basal cell carcinoma of skin of other parts of face: Principal | ICD-10-CM

## 2021-10-09 MED ORDER — HYDROCODONE 5 MG-ACETAMINOPHEN 325 MG TABLET
ORAL_TABLET | Freq: Four times a day (QID) | ORAL | 0 refills | 3 days | Status: CP | PRN
Start: 2021-10-09 — End: ?

## 2021-11-03 ENCOUNTER — Ambulatory Visit: Admit: 2021-11-03 | Discharge: 2021-11-04 | Payer: PRIVATE HEALTH INSURANCE

## 2021-11-04 ENCOUNTER — Ambulatory Visit: Admit: 2021-11-04 | Discharge: 2021-11-05 | Payer: PRIVATE HEALTH INSURANCE

## 2021-11-04 DIAGNOSIS — Z2820 Immunization not carried out because of patient decision for unspecified reason: Secondary | ICD-10-CM | POA: Insufficient documentation

## 2021-11-04 DIAGNOSIS — M5030 Other cervical disc degeneration, unspecified cervical region: Principal | ICD-10-CM

## 2021-11-04 DIAGNOSIS — I63231 Cerebral infarction due to unspecified occlusion or stenosis of right carotid arteries: Principal | ICD-10-CM

## 2021-11-04 DIAGNOSIS — F17200 Nicotine dependence, unspecified, uncomplicated: Principal | ICD-10-CM

## 2021-11-04 DIAGNOSIS — Z8679 Personal history of other diseases of the circulatory system: Principal | ICD-10-CM

## 2021-11-04 DIAGNOSIS — E785 Hyperlipidemia, unspecified: Principal | ICD-10-CM

## 2021-11-04 DIAGNOSIS — F419 Anxiety disorder, unspecified: Principal | ICD-10-CM

## 2021-11-04 DIAGNOSIS — Z7185 Vaccine counseling: Principal | ICD-10-CM

## 2021-11-04 MED ORDER — GABAPENTIN 300 MG CAPSULE
ORAL_CAPSULE | Freq: Two times a day (BID) | ORAL | 0 refills | 90 days | Status: CP
Start: 2021-11-04 — End: 2022-11-04

## 2021-11-04 MED ORDER — ATORVASTATIN 10 MG TABLET
ORAL_TABLET | Freq: Every day | ORAL | 3 refills | 90 days | Status: CP
Start: 2021-11-04 — End: 2022-11-04

## 2022-02-03 ENCOUNTER — Ambulatory Visit: Admit: 2022-02-03 | Discharge: 2022-02-04 | Payer: PRIVATE HEALTH INSURANCE

## 2022-02-03 DIAGNOSIS — Z2820 Immunization not carried out because of patient decision for unspecified reason: Principal | ICD-10-CM

## 2022-02-03 DIAGNOSIS — M5030 Other cervical disc degeneration, unspecified cervical region: Principal | ICD-10-CM

## 2022-02-03 DIAGNOSIS — I63231 Cerebral infarction due to unspecified occlusion or stenosis of right carotid arteries: Principal | ICD-10-CM

## 2022-02-03 DIAGNOSIS — E785 Hyperlipidemia, unspecified: Principal | ICD-10-CM

## 2022-02-03 DIAGNOSIS — Z8679 Personal history of other diseases of the circulatory system: Principal | ICD-10-CM

## 2022-02-03 DIAGNOSIS — F17200 Nicotine dependence, unspecified, uncomplicated: Principal | ICD-10-CM

## 2022-02-03 DIAGNOSIS — M79672 Pain in left foot: Principal | ICD-10-CM

## 2022-02-03 MED ORDER — AMLODIPINE 5 MG TABLET
ORAL_TABLET | Freq: Every day | ORAL | 1 refills | 90 days | Status: CP
Start: 2022-02-03 — End: 2023-02-03

## 2022-02-03 MED ORDER — GABAPENTIN 300 MG CAPSULE
ORAL_CAPSULE | Freq: Three times a day (TID) | ORAL | 3 refills | 30 days | Status: CP
Start: 2022-02-03 — End: 2023-02-03

## 2022-03-03 ENCOUNTER — Ambulatory Visit
Admit: 2022-03-03 | Discharge: 2022-03-03 | Disposition: A | Discharge status: Home / Self Care | Payer: PRIVATE HEALTH INSURANCE | Attending: Student in an Organized Health Care Education/Training Program

## 2022-03-03 DIAGNOSIS — R42 Dizziness and giddiness: Principal | ICD-10-CM

## 2022-03-30 ENCOUNTER — Ambulatory Visit
Admission: EM | Admit: 2022-03-30 | Discharge: 2022-03-30 | Disposition: A | Discharge status: Home / Self Care | Payer: Medicaid Other | Attending: Physician Assistant | Admitting: Physician Assistant

## 2022-03-30 DIAGNOSIS — K5900 Constipation, unspecified: Secondary | ICD-10-CM | POA: Diagnosis not present

## 2022-03-30 DIAGNOSIS — R300 Dysuria: Secondary | ICD-10-CM | POA: Insufficient documentation

## 2022-03-30 DIAGNOSIS — N390 Urinary tract infection, site not specified: Secondary | ICD-10-CM | POA: Insufficient documentation

## 2022-03-30 DIAGNOSIS — R1084 Generalized abdominal pain: Secondary | ICD-10-CM | POA: Insufficient documentation

## 2022-03-30 LAB — URINALYSIS, ROUTINE W REFLEX MICROSCOPIC
Bilirubin Urine: NEGATIVE
Glucose, UA: NEGATIVE mg/dL
Ketones, ur: NEGATIVE mg/dL
Leukocytes,Ua: NEGATIVE
Nitrite: NEGATIVE
Protein, ur: 30 mg/dL — AB
Specific Gravity, Urine: 1.025 (ref 1.005–1.030)
pH: 6 (ref 5.0–8.0)

## 2022-03-30 LAB — URINALYSIS, MICROSCOPIC (REFLEX)

## 2022-03-30 MED ORDER — POLYETHYLENE GLYCOL 3350 17 G PO PACK: 17.0000 g | PACK | Freq: Every day | ORAL | 0 refills | Status: AC

## 2022-03-30 MED ORDER — SULFAMETHOXAZOLE-TRIMETHOPRIM 800-160 MG PO TABS: 1.0000 | ORAL_TABLET | Freq: Two times a day (BID) | ORAL | 0 refills | Status: AC

## 2022-03-30 NOTE — ED Triage Notes (Signed)
Patient is here for Urinary retention and difficulty as well as Constipation. No injury to private area or abd. History of kidney stones. No blood seen in Urine. No fever known. No concern for STI.  ?

## 2022-03-30 NOTE — ED Provider Notes (Signed)
[facility]    CSN: [id number] Arrival date & time: 03/30/22  1601      History   Chief Complaint Chief Complaint  Patient presents with   Dysuria    HPI Maurice Fletcher is a 61 y.o. male.   Patient presents today with a several month history of urinary symptoms as well as generalized abdominal pain and constipation.  He reports last bowel movement was earlier today but was difficult to pass and required straining.  He has not tried any over-the-counter medication for symptom management.  He does report generalized abdominal pain which is rated 8 on a 0-10 pain scale, described as aching, no aggravating relieving factors identified.  He also reports dysuria, urinary frequency.  He denies any history of prostatitis and denies any significant rectal pain.  He denies history of BPH and has not seen urologist in the past.  He does have a history of nephrolithiasis.  Denies any recent antibiotics.  Has not tried any over-the-counter medication for symptom management.  He denies any nausea, vomiting, fever.  He has no concern for STIs he is sexually active with male partner and has been in a monogamous relationship for many years.   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   Vertebral osteomyelitis (HCC) 04/21/2016   History of right MCA stroke 03/16/2016   Hyperlipidemia with target LDL less than 70 03/16/2016   Adjustment disorder with disturbance of conduct  02/06/2016   Cannabis use disorder, mild, abuse 02/06/2016   Gout 02/06/2016   Acute flank pain 02/06/2016   Violent behavior 02/06/2016   Cerebrovascular accident (CVA) due to occlusion of right carotid artery (HCC) 02/06/2016   Anxiety 02/06/2016   Skin cancer 02/06/2016   History of kidney stones 01/31/2016   Cytotoxic cerebral edema (HCC) 01/27/2016   History of hypertension 01/27/2016   Personal history of other malignant neoplasm of skin 01/27/2016   Tobacco abuse 01/27/2016   ICAO (internal carotid artery occlusion), right 01/26/2016   Tobacco dependence 11/13/2015    History reviewed. No pertinent surgical history.     Home Medications    Prior to Admission medications   Medication Sig Start Date End Date Taking? Authorizing Provider  amLODipine (NORVASC) 5 MG tablet Take 5 mg by mouth daily. 02/03/22  Yes [provider]  aspirin EC 81 MG tablet Take 81 mg by mouth daily.   Yes [provider]  atorvastatin (LIPITOR) 10 MG tablet Take 1 tablet by mouth daily. 07/13/16 11/04/22 Yes [provider]  colchicine 0.6 MG tablet Take 2 tablets (1.2mg ) by mouth at first sign of gout flare followed by 1 tablet (0.6mg ) after 1 hour. (Max 1.8mg  within 1 hour) 02/03/16  Yes [provider]  oxyCODONE (OXY IR/ROXICODONE) 5 MG immediate release tablet Take by mouth. 10/09/16  Yes [provider]  polyethylene glycol (MIRALAX) 17 g packet Take 17 g by mouth daily.  03/30/22  Yes Damyah Gugel K, PA-C  sulfamethoxazole-trimethoprim (BACTRIM DS) 800-160 MG tablet Take 1 tablet by mouth 2 (two) times daily for 7 days. 03/30/22 04/06/22 Yes Candid Bovey, Noberto Retort, PA-C  ALPRAZolam (XANAX) 0.5 MG tablet Take by mouth.    [provider]  gabapentin (NEURONTIN) 300 MG capsule Take by mouth. 11/04/21   [provider]  mupirocin ointment (BACTROBAN) 2 % Apply 1 application topically 3 (three) times daily. 01/20/19   Lutricia Feil, PA-C  tamsulosin  (FLOMAX) 0.4 MG CAPS capsule Take by mouth.    [provider]    Family History Family History  Problem Relation Age of Onset   Healthy Mother     Social History Social History   Tobacco Use   Smoking status: Every Day    Types: Cigarettes   Smokeless tobacco: Never  Vaping Use   Vaping Use: Never used  Substance Use Topics   Alcohol use: No   Drug use: No     Allergies   Patient has no known allergies.   Review of Systems Review of Systems  Constitutional:  Positive for activity change. Negative for appetite change, fatigue and fever.  Respiratory:  Negative for cough and shortness of breath.   Cardiovascular:  Negative for chest pain.  Gastrointestinal:  Positive for abdominal pain and constipation. Negative for diarrhea, nausea and vomiting.  Genitourinary:  Positive for difficulty urinating, dysuria and frequency. Negative for flank pain, hematuria, penile discharge, penile pain and urgency.    Physical Exam Triage Vital Signs ED Triage Vitals  Enc Vitals Group     BP 03/30/22 1611 (!) 144/90     Pulse Rate 03/30/22 1611 87     Resp 03/30/22 1611 18     Temp 03/30/22 1611 98.3 F (36.8 C)     Temp Source 03/30/22 1611 Oral     SpO2 03/30/22 1611 97 %     Weight 03/30/22 1609 155 lb (70.3 kg)     Height 03/30/22 1609 5\' 6"  (1.676 m)     Head Circumference --      Peak Flow --      Pain Score 03/30/22 1606 10     Pain Loc --      Pain Edu? --      Excl. in GC? --    No data found.  Updated Vital Signs BP (!) 144/90 (BP Location: Left Arm)   Pulse 87   Temp 98.3 F (36.8 C) (Oral)   Resp 18   Ht 5\' 6"  (1.676 m)   Wt 155 lb (70.3 kg)   SpO2 97%   BMI 25.02 kg/m   Visual Acuity Right Eye Distance:   Left Eye Distance:   Bilateral Distance:    Right Eye Near:   Left Eye Near:    Bilateral Near:     Physical Exam Vitals reviewed.  Constitutional:      General: He is awake.     Appearance: Normal appearance. He is  well-developed. He is not ill-appearing.     Comments: Very pleasant male appears stated age in no acute distress sitting comfortably in exam room  HENT:     Head: Normocephalic and atraumatic.     Mouth/Throat:     Pharynx: No oropharyngeal exudate, posterior oropharyngeal erythema or uvula swelling.  Cardiovascular:     Rate and Rhythm: Normal rate and regular rhythm.     Heart sounds: Normal heart sounds, S1 normal and S2 normal. No murmur heard.  Pulmonary:     Effort: Pulmonary effort is normal.     Breath sounds: Normal breath sounds. No stridor. No wheezing, rhonchi or rales.     Comments: Clear to auscultation bilaterally Abdominal:     General: Bowel sounds are normal.     Palpations: Abdomen is soft.     Tenderness: There is generalized abdominal tenderness. There is no right CVA tenderness, left CVA tenderness, guarding or rebound.     Comments: Generalized tenderness palpation throughout abdomen.  No evidence of acute abdomen on physical exam.  Genitourinary:    Comments: Exam deferred Neurological:     Mental Status: He is alert.  Psychiatric:        Behavior: Behavior is cooperative.     UC Treatments / Results  Labs (all labs ordered are listed, but only abnormal results are displayed) Labs Reviewed  URINALYSIS, ROUTINE W REFLEX MICROSCOPIC - Abnormal; Notable for the following components:      Result Value   Hgb urine dipstick MODERATE (*)    Protein, ur 30 (*)    All other components within normal limits  URINALYSIS, MICROSCOPIC (REFLEX) - Abnormal; Notable for the following components:   Bacteria, UA RARE (*)    All other components within normal limits  URINE CULTURE    EKG   Radiology No results found.  Procedures Procedures (including critical care time)  Medications Ordered in UC Medications - No data to display  Initial Impression / Assessment and Plan / UC Course  I have reviewed the triage vital signs and the nursing notes.  Pertinent  labs & imaging results that were available during my care of the patient were reviewed by me and considered in my medical decision making (see chart for details).     Vital signs and physical exam reassuring today; no indication for emergent evaluation or imaging.  Patient is afebrile, nontoxic, nontachycardic.  Suspect that constipation is contributing to symptoms including abdominal pain.  He was started on MiraLAX with instruction to increase fiber in his diet and drink plenty of fluid.  UA did show hemoglobin and bacteria on microscopic analysis so will cover for UTI with Bactrim DS.  Urine culture was obtained and discussed that if we need to change antibiotics we will contact him.  Recommended that he follow-up with his primary care provider later this week for reevaluation.  Discussed that if he has any worsening symptoms including severe abdominal pain, fever, nausea, vomiting, flank pain, hematuria, urinary retention, rectal pain, constipation, obstipation he needs to go to the emergency room immediately.  Strict return precautions given to which he expressed understanding.  Final Clinical Impressions(s) / UC Diagnoses   Final diagnoses:  Generalized abdominal pain  Constipation, unspecified constipation type  Dysuria  Lower urinary tract infectious disease     Discharge Instructions      Your urine does show some evidence of infection.  Please start Bactrim DS twice daily.  We will contact you if we need to change this based on your culture results.  Make sure you are drinking plenty of fluid.  Follow-up with your primary care provider next week for reevaluation.  I believe that constipation is causing your abdominal pain.  Start MiraLAX daily.  Eat plenty of fiber and drink lots of fluids.  If you have any difficulty passing stool, difficulty passing gas, severe abdominal pain, nausea, vomiting you need to go to the emergency room immediately.     ED Prescriptions  Medication Sig Dispense Auth. Provider   sulfamethoxazole-trimethoprim (BACTRIM DS) 800-160 MG tablet Take 1 tablet by mouth 2 (two) times daily for 7 days. 14 tablet Raudel Bazen K, PA-C   polyethylene glycol (MIRALAX) 17 g packet Take 17 g by mouth daily. 14 each Chayim Bialas, Noberto Retort, PA-C      PDMP not reviewed this encounter.   Jeani Hawking, PA-C 03/30/22 1651

## 2022-03-30 NOTE — Discharge Instructions (Signed)
Your urine does show some evidence of infection.  Please start Bactrim DS twice daily.  We will contact you if we need to change this based on your culture results.  Make sure you are drinking plenty of fluid.  Follow-up with your primary care provider next week for reevaluation. ? ?I believe that constipation is causing your abdominal pain.  Start MiraLAX daily.  Eat plenty of fiber and drink lots of fluids.  If you have any difficulty passing stool, difficulty passing gas, severe abdominal pain, nausea, vomiting you need to go to the emergency room immediately. ?

## 2022-04-01 LAB — URINE CULTURE: Culture: NO GROWTH

## 2022-04-15 ENCOUNTER — Ambulatory Visit: Admit: 2022-04-15 | Discharge: 2022-04-16 | Payer: PRIVATE HEALTH INSURANCE

## 2022-04-15 DIAGNOSIS — N401 Enlarged prostate with lower urinary tract symptoms: Principal | ICD-10-CM

## 2022-04-15 DIAGNOSIS — Z1211 Encounter for screening for malignant neoplasm of colon: Principal | ICD-10-CM

## 2022-04-15 DIAGNOSIS — E785 Hyperlipidemia, unspecified: Principal | ICD-10-CM

## 2022-04-15 DIAGNOSIS — F17200 Nicotine dependence, unspecified, uncomplicated: Principal | ICD-10-CM

## 2022-04-15 DIAGNOSIS — K5900 Constipation, unspecified: Principal | ICD-10-CM

## 2022-04-15 DIAGNOSIS — Z8679 Personal history of other diseases of the circulatory system: Principal | ICD-10-CM

## 2022-04-15 MED ORDER — POLYETHYLENE GLYCOL 3350 17 GRAM ORAL POWDER PACKET
PACK | Freq: Two times a day (BID) | ORAL | 2 refills | 50.00000 days | Status: CP
Start: 2022-04-15 — End: ?

## 2022-04-16 DIAGNOSIS — R399 Unspecified symptoms and signs involving the genitourinary system: Principal | ICD-10-CM

## 2022-04-16 DIAGNOSIS — Z87442 Personal history of urinary calculi: Principal | ICD-10-CM

## 2022-04-16 DIAGNOSIS — F17200 Nicotine dependence, unspecified, uncomplicated: Principal | ICD-10-CM

## 2022-04-20 ENCOUNTER — Emergency Department
Admit: 2022-04-20 | Discharge: 2022-04-21 | Disposition: A | Discharge status: Home / Self Care | Payer: PRIVATE HEALTH INSURANCE

## 2022-04-20 ENCOUNTER — Ambulatory Visit
Admit: 2022-04-20 | Discharge: 2022-04-21 | Disposition: A | Discharge status: Home / Self Care | Payer: PRIVATE HEALTH INSURANCE

## 2022-04-21 MED ORDER — OXYBUTYNIN CHLORIDE 5 MG TABLET
ORAL_TABLET | Freq: Two times a day (BID) | ORAL | 0 refills | 30 days | Status: CP
Start: 2022-04-21 — End: 2022-05-21

## 2022-04-28 DIAGNOSIS — Z125 Encounter for screening for malignant neoplasm of prostate: Principal | ICD-10-CM

## 2022-04-28 DIAGNOSIS — R399 Unspecified symptoms and signs involving the genitourinary system: Principal | ICD-10-CM

## 2022-04-29 ENCOUNTER — Other Ambulatory Visit: Admit: 2022-04-29 | Discharge: 2022-04-30 | Payer: PRIVATE HEALTH INSURANCE

## 2022-04-29 DIAGNOSIS — R399 Unspecified symptoms and signs involving the genitourinary system: Principal | ICD-10-CM

## 2022-04-29 DIAGNOSIS — Z125 Encounter for screening for malignant neoplasm of prostate: Principal | ICD-10-CM

## 2022-05-01 ENCOUNTER — Ambulatory Visit: Admit: 2022-05-01 | Discharge: 2022-05-02 | Payer: PRIVATE HEALTH INSURANCE

## 2022-05-01 DIAGNOSIS — R3916 Straining to void: Principal | ICD-10-CM

## 2022-05-01 DIAGNOSIS — N401 Enlarged prostate with lower urinary tract symptoms: Principal | ICD-10-CM

## 2022-05-01 DIAGNOSIS — R310 Gross hematuria: Principal | ICD-10-CM

## 2022-05-01 DIAGNOSIS — N410 Acute prostatitis: Principal | ICD-10-CM

## 2022-05-01 DIAGNOSIS — R319 Hematuria, unspecified: Principal | ICD-10-CM

## 2022-05-01 DIAGNOSIS — R972 Elevated prostate specific antigen [PSA]: Principal | ICD-10-CM

## 2022-05-01 MED ORDER — TAMSULOSIN 0.4 MG CAPSULE
ORAL_CAPSULE | Freq: Every day | ORAL | 6 refills | 30 days | Status: CP
Start: 2022-05-01 — End: 2023-05-01

## 2022-05-01 MED ORDER — DOXYCYCLINE HYCLATE 100 MG CAPSULE
ORAL_CAPSULE | Freq: Two times a day (BID) | ORAL | 0 refills | 28 days | Status: CP
Start: 2022-05-01 — End: ?

## 2022-05-06 ENCOUNTER — Ambulatory Visit: Admit: 2022-05-06 | Discharge: 2022-05-07 | Payer: PRIVATE HEALTH INSURANCE

## 2022-05-06 DIAGNOSIS — I63231 Cerebral infarction due to unspecified occlusion or stenosis of right carotid arteries: Principal | ICD-10-CM

## 2022-05-06 DIAGNOSIS — R399 Unspecified symptoms and signs involving the genitourinary system: Principal | ICD-10-CM

## 2022-05-06 DIAGNOSIS — Z8679 Personal history of other diseases of the circulatory system: Principal | ICD-10-CM

## 2022-05-06 DIAGNOSIS — K5900 Constipation, unspecified: Principal | ICD-10-CM

## 2022-05-06 DIAGNOSIS — F17200 Nicotine dependence, unspecified, uncomplicated: Principal | ICD-10-CM

## 2022-05-06 MED ORDER — CYCLOBENZAPRINE 10 MG TABLET
ORAL_TABLET | Freq: Every evening | ORAL | 0 refills | 30.00000 days | Status: CP | PRN
Start: 2022-05-06 — End: ?

## 2022-05-14 ENCOUNTER — Ambulatory Visit: Admit: 2022-05-14 | Discharge: 2022-05-14 | Payer: PRIVATE HEALTH INSURANCE | Attending: Urology | Primary: Urology

## 2022-05-14 DIAGNOSIS — R319 Hematuria, unspecified: Principal | ICD-10-CM

## 2022-05-20 DIAGNOSIS — R972 Elevated prostate specific antigen [PSA]: Principal | ICD-10-CM

## 2022-05-20 MED ORDER — LEVOFLOXACIN 500 MG TABLET
ORAL_TABLET | Freq: Once | ORAL | 0 refills | 1 days | Status: CP
Start: 2022-05-20 — End: 2022-05-20

## 2022-05-21 ENCOUNTER — Ambulatory Visit: Admit: 2022-05-21 | Discharge: 2022-05-22 | Payer: PRIVATE HEALTH INSURANCE

## 2022-05-21 ENCOUNTER — Encounter
Admit: 2022-05-21 | Discharge: 2022-05-22 | Payer: PRIVATE HEALTH INSURANCE | Attending: Anesthesiology | Primary: Anesthesiology

## 2022-05-21 MED ORDER — SENNOSIDES 8.6 MG TABLET
ORAL_TABLET | Freq: Every day | ORAL | 0 refills | 30 days | Status: CP
Start: 2022-05-21 — End: 2022-06-20

## 2022-05-21 MED ORDER — OXYCODONE 5 MG TABLET
ORAL_TABLET | ORAL | 0 refills | 1 days | Status: CP | PRN
Start: 2022-05-21 — End: ?

## 2022-05-21 MED ORDER — ACETAMINOPHEN 500 MG TABLET
ORAL_TABLET | Freq: Three times a day (TID) | ORAL | 0 refills | 7 days | Status: CP | PRN
Start: 2022-05-21 — End: 2022-05-28

## 2022-05-21 MED ORDER — OXYBUTYNIN CHLORIDE 5 MG TABLET
ORAL_TABLET | Freq: Three times a day (TID) | ORAL | 0 refills | 2 days | Status: CP
Start: 2022-05-21 — End: 2022-05-23

## 2022-05-21 MED ORDER — DOCUSATE SODIUM 100 MG CAPSULE
ORAL_CAPSULE | Freq: Two times a day (BID) | ORAL | 0 refills | 30 days | Status: CP
Start: 2022-05-21 — End: 2022-06-20

## 2022-05-26 ENCOUNTER — Ambulatory Visit: Admit: 2022-05-26 | Discharge: 2022-05-27 | Payer: PRIVATE HEALTH INSURANCE

## 2022-05-26 DIAGNOSIS — C61 Malignant neoplasm of prostate: Principal | ICD-10-CM

## 2022-05-28 DIAGNOSIS — C61 Malignant neoplasm of prostate: Principal | ICD-10-CM

## 2022-05-29 DIAGNOSIS — C61 Malignant neoplasm of prostate: Principal | ICD-10-CM

## 2022-06-01 ENCOUNTER — Ambulatory Visit: Admit: 2022-06-01 | Payer: PRIVATE HEALTH INSURANCE

## 2022-06-10 DIAGNOSIS — C61 Malignant neoplasm of prostate: Principal | ICD-10-CM

## 2022-06-11 ENCOUNTER — Ambulatory Visit: Admit: 2022-06-11 | Discharge: 2022-06-12 | Payer: PRIVATE HEALTH INSURANCE

## 2022-06-18 ENCOUNTER — Ambulatory Visit
Admit: 2022-06-18 | Discharge: 2022-06-18 | Payer: PRIVATE HEALTH INSURANCE | Attending: Internal Medicine | Primary: Internal Medicine

## 2022-06-18 ENCOUNTER — Ambulatory Visit
Admit: 2022-06-18 | Discharge: 2022-06-18 | Payer: PRIVATE HEALTH INSURANCE | Attending: Hematology & Oncology | Primary: Hematology & Oncology

## 2022-06-18 DIAGNOSIS — C61 Malignant neoplasm of prostate: Principal | ICD-10-CM

## 2022-06-18 DIAGNOSIS — R3916 Straining to void: Principal | ICD-10-CM

## 2022-06-18 DIAGNOSIS — I63231 Cerebral infarction due to unspecified occlusion or stenosis of right carotid arteries: Principal | ICD-10-CM

## 2022-06-18 DIAGNOSIS — N401 Enlarged prostate with lower urinary tract symptoms: Principal | ICD-10-CM

## 2022-06-18 DIAGNOSIS — E785 Hyperlipidemia, unspecified: Principal | ICD-10-CM

## 2022-06-18 DIAGNOSIS — Z8679 Personal history of other diseases of the circulatory system: Principal | ICD-10-CM

## 2022-06-18 MED ORDER — OXYCODONE 5 MG TABLET
ORAL_TABLET | ORAL | 0 refills | 4 days | Status: CP | PRN
Start: 2022-06-18 — End: ?

## 2022-06-18 MED ORDER — TAMSULOSIN 0.4 MG CAPSULE
ORAL_CAPSULE | Freq: Every day | ORAL | 11 refills | 30 days | Status: CP
Start: 2022-06-18 — End: ?

## 2022-06-18 MED ORDER — DOCUSATE SODIUM 100 MG CAPSULE
ORAL_CAPSULE | Freq: Two times a day (BID) | ORAL | 11 refills | 30 days | Status: CP
Start: 2022-06-18 — End: ?

## 2022-06-18 MED ORDER — AMLODIPINE 5 MG TABLET
ORAL_TABLET | Freq: Every day | ORAL | 3 refills | 90 days | Status: CP
Start: 2022-06-18 — End: ?

## 2022-06-18 MED ORDER — SENNOSIDES 8.6 MG TABLET
ORAL_TABLET | Freq: Every day | ORAL | 5 refills | 30 days | Status: CP
Start: 2022-06-18 — End: ?

## 2022-06-18 MED ORDER — ATORVASTATIN 10 MG TABLET
ORAL_TABLET | Freq: Every day | ORAL | 3 refills | 90 days | Status: CP
Start: 2022-06-18 — End: ?

## 2022-06-22 ENCOUNTER — Ambulatory Visit: Admit: 2022-06-22 | Discharge: 2022-06-23 | Payer: PRIVATE HEALTH INSURANCE

## 2022-06-22 DIAGNOSIS — C61 Malignant neoplasm of prostate: Principal | ICD-10-CM

## 2022-06-24 DIAGNOSIS — C61 Malignant neoplasm of prostate: Principal | ICD-10-CM

## 2022-06-25 DIAGNOSIS — C61 Malignant neoplasm of prostate: Principal | ICD-10-CM

## 2022-06-26 DIAGNOSIS — C61 Malignant neoplasm of prostate: Principal | ICD-10-CM

## 2022-06-26 MED ORDER — OXYCODONE 5 MG TABLET
ORAL_TABLET | ORAL | 0 refills | 5 days | Status: CP | PRN
Start: 2022-06-26 — End: ?

## 2022-06-30 ENCOUNTER — Ambulatory Visit: Admit: 2022-06-30 | Payer: PRIVATE HEALTH INSURANCE

## 2022-07-06 DIAGNOSIS — C61 Malignant neoplasm of prostate: Principal | ICD-10-CM

## 2022-07-07 ENCOUNTER — Ambulatory Visit
Admit: 2022-07-07 | Discharge: 2022-07-08 | Payer: PRIVATE HEALTH INSURANCE | Attending: Hematology & Oncology | Primary: Hematology & Oncology

## 2022-07-07 ENCOUNTER — Institutional Professional Consult (permissible substitution): Admit: 2022-07-07 | Discharge: 2022-07-08 | Payer: PRIVATE HEALTH INSURANCE

## 2022-07-07 ENCOUNTER — Other Ambulatory Visit: Admit: 2022-07-07 | Discharge: 2022-07-08 | Payer: PRIVATE HEALTH INSURANCE

## 2022-07-07 DIAGNOSIS — C61 Malignant neoplasm of prostate: Principal | ICD-10-CM

## 2022-07-07 MED ORDER — ABIRATERONE 250 MG TABLET
ORAL_TABLET | Freq: Every day | ORAL | 11 refills | 30 days | Status: CP
Start: 2022-07-07 — End: ?
  Filled 2022-07-09: qty 120, 30d supply, fill #0

## 2022-07-07 MED ORDER — PREDNISONE 5 MG TABLET
ORAL_TABLET | Freq: Every day | ORAL | 11 refills | 30 days | Status: CP
Start: 2022-07-07 — End: ?
  Filled 2022-07-09: qty 30, 30d supply, fill #0

## 2022-07-07 NOTE — Unmapped (Signed)
[facility] Visit Note  New Patient    Patient Name: Patrick Montgomery  Patient Age: 61 y.o.  Encounter Date: 07/07/2022  Attending Provider:  Constanza Mincy E. Philomena Course, MD  Referring physician: Deneise Lever, F*    Assessment  Patient Active Problem List   Diagnosis    Tobacco dependence    Cerebrovascular accident (CVA) due to occlusion of right carotid artery (CMS-HCC)    Anxiety    Skin cancer    Gout    History of hypertension    History of kidney stones    Hyperlipidemia with target LDL less than 70    Drug abuse in remission (CMS-HCC)    Vaccine refused by patient    Hematuria    Prostate cancer (CMS-HCC)     Prostate Cancer, very high risk, localized by conventional imaging    Patrick Montgomery is a 60 y.o. male who presents for management of newly diagnosed prostate cancer. He was initially seen in the ER In May 2023 for gross hematuria and  worsening LUTS. A CT scan of the bladder revealed a bladder mass. In June 2023, he was found to have a PSA of 18. He underwent cystoscopy, prostate biopsy, and TURP. Prostate biopsy showed Gleason 4+5=9 (GG 5), 10/12 cores positive for cancer. TURP specimen showed mixed acinar/ductal features, cribriborm morphology. In July 2023, CT and bone scan showed no pelvic or distant metastases.    On 06/18/22, we discussed management of his recent diagnosis of prostate cancer. Given that he has been diagnosed with prostate cancer at a very Amijah Timothy age, I informed the patient that there may be features that indicate a higher risk of hereditary prostate cancer. 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   Based on current imaging, his cancer is confined to the prostate. A PSMA PET scan will be ordered to see if the cancer has metastasized.      Today, on 07/07/2022, pt is now ready to move ahead with treatment. Will proceed with ADT plus abiraterone for two years, per STAMPEDE.  PSMA PET still undergoing authorization.    Plan:  Get PSMA PET to identify whether cancer has metastasized  Start ADT with Degarelix today. Then, in 4 weeks continue ADT with Eligard. Anticipate 3 years of therapy (till 8-07/2025)  -- Follow PSA  Abiraterone and prednisone prescribed  -- Follow labs/BP per our SOC  Follow up with radiation oncology  -- I will contact rad onc about his desire to get RT treatment at [facility]  5. Germline testing to be considered in the future  6. Return in 1 month    I personally spent 40 minutes face-to-face and non-face-to-face in the care of this patient, which includes all pre, intra, and post visit time on the date of service.  All documented time was specific to the E/M visit and does not include any procedures that may have been performed.    Reason for Visit  Follow up of prostate cancer.     History of Present Illness:  Oncology History Overview Note   In 03/2022, seen in ER for gross hematuria, worsening LUTS. CT bladder showing bladder mass.  In 04/2022, PSA 18. Had cysto, prostate biopsy, TURP. Prostate biopsy showed Gleason 4+5=9 (GG 5), 10/12 cores positive for cancer. TURP specimen showed mixed acinar/ductal features, cribriborm morphology.  In 05/2022, CT and bone scan showed no pelvic or distant mets     Prostate cancer (CMS-HCC)  06/17/2022 Initial Diagnosis    Prostate cancer (CMS-HCC)     06/17/2022 -  Cancer Staged    Staging form: Prostate, AJCC 8th Edition  - Clinical: Stage IIIC (cT4, cN0, cM0, PSA: 18, Grade Group: 5) - Signed by Maurie Boettcher, MD on 06/19/2022       07/07/2022 Endocrine/Hormone Therapy    OP DEGARELIX  Plan Provider: Maurie Boettcher, MD     08/06/2022 Endocrine/Hormone Therapy    OP PROSTATE LEUPROLIDE (ELIGARD) 45 MG EVERY 6 MONTHS  Plan Provider: Maurie Boettcher, MD         Interval history  The patient returns to clinic, accompanied by his wife. Pt had decided against surgery and is ready to pursue hormonal therapy/radiation therapy. Pt notes persistent pain in his pelvis area, just below the anus and the pain also radiates down the inner aspect of R leg and thigh.  Pt has taken oxycodone, but this didn't help. Pt also notes constipation.    Allergies:  No Known Allergies    Current Medications:    Current Outpatient Medications:     amLODIPine (NORVASC) 5 MG tablet, Take 1 tablet (5 mg total) by mouth daily., Disp: 90 tablet, Rfl: 3    aspirin (ECOTRIN) 81 MG tablet, Take 1 tablet (81 mg total) by mouth daily., Disp: 90 tablet, Rfl: 3    atorvastatin (LIPITOR) 10 MG tablet, Take 1 tablet (10 mg total) by mouth daily., Disp: 90 tablet, Rfl: 3    cyclobenzaprine (FLEXERIL) 10 MG tablet, Take 1 tablet (10 mg total) by mouth nightly as needed for muscle spasms., Disp: 30 tablet, Rfl: 0    docusate sodium (COLACE) 100 MG capsule, Take 1 capsule (100 mg total) by mouth Two (2) times a day., Disp: 60 capsule, Rfl: 11    oxyCODONE (ROXICODONE) 5 MG immediate release tablet, Take 1 tablet (5 mg total) by mouth every four (4) hours as needed for pain., Disp: 30 tablet, Rfl: 0    polyethylene glycol (MIRALAX) 17 gram packet, Take 17 g by mouth Two (2) times a day., Disp: 100 packet, Rfl: 2    SENNA 8.6 mg tablet, Take 1 tablet by mouth daily., Disp: 30 tablet, Rfl: 5    tamsulosin (FLOMAX) 0.4 mg capsule, Take 1 capsule (0.4 mg total) by mouth daily., Disp: 30 capsule, Rfl: 11    abiraterone (ZYTIGA) 250 mg Tab tablet, Take 4 tablets (1,000 mg total) by mouth daily., Disp: 120 tablet, Rfl: 11    predniSONE (DELTASONE) 5 MG tablet, Take 1 tablet (5 mg total) by mouth daily., Disp: 30 tablet, Rfl: 11    Past Medical History and Social History  Past Medical History:   Diagnosis Date    Anxiety     Arthritis     Basal cell carcinoma     Cancer (CMS-HCC)     skin    Gout     Kidney stones     Squamous cell skin cancer     Stroke (CMS-HCC)       Past Surgical History:   Procedure Laterality Date    PR BIOPSY OF PROSTATE,NEEDLE/PUNCH N/A 05/21/2022    Procedure: IMAGE GUIDED BIOPSY, PROSTATE; NEEDLE OR PUNCH, SINGLE OR MULTIPLE, ANY APPROACH;  Surgeon: Phillips Grout, MD;  Location: [facility] OR [facility];  Service: Urology    PR TRANSURETHRAL ELEC-SURG PROSTATECTOM N/A 05/21/2022    Procedure: TRANSURETHRAL ELECTROSURGICAL RESECT PROSTATE, W/CONTORL POSTOP BLEED, COMPLETE (VASECT, MEATOT, CYSTO ETC);  Surgeon: Phillips Grout,  MD;  Location: [facility] OR [facility];  Service: Urology    SKIN BIOPSY      skin cancer removal           Social History     Occupational History    Not on file   Tobacco Use    Smoking status: Every Day    Smokeless tobacco: Never   Substance and Sexual Activity    Alcohol use: No     Alcohol/week: 0.0 standard drinks of alcohol     Comment: socially     Drug use: Not Currently     Types: Marijuana    Sexual activity: Not on file       Family History  Family History   Problem Relation Age of Onset    Cancer Father         Skin    Cancer Brother         skin     Substance Abuse Disorder Neg Hx      Prostate Cancer Family History Assessment:  History of cancer in children (yes/no; if yes, what type AND age of diagnosis): No  Total number of siblings (including deceased): 2  History of cancer in siblings (yes/no; if yes, provide relation, type of cancer, AND age of diagnosis): Brother superficial basal cell; age unknown  History of cancer in parents (yes/no; if yes, please specify parent, type of cancer, AND age of diagnosis): Father superficial basal cell carcinoma; age unknown  History of cancer in aunts/uncles/grandparents (yes/no; if yes, provide relation, type of cancer, AND age of diagnosis): No      Review of Systems:  A comprehensive review of 10 systems was negative except for pertinent positives noted in HPI.    Physical Exam:    VITAL SIGNS:  BP (S) 160/77  - Pulse (S) (!) 46  - Temp 36.4 ??C (97.6 ??F)  - Resp 16  - Ht 167.6 cm (5' 6)  - Wt 66.8 kg (147 lb 3.2 oz)  - SpO2 100%  - BMI 23.76 kg/m??   ECOG Performance Status: 1  GENERAL: Well-developed, well-nourished patient in no acute distress.  HEAD: Normocephalic and atraumatic.  EYES: Conjunctivae are normal. No scleral icterus.  MOUTH/THROAT: Oropharynx is clear and moist.  No mucosal lesions.  NECK: Supple, no thyromegaly.  LYMPHATICS: No palpable cervical, supraclavicular, or axillary adenopathy.  CARDIOVASCULAR: Normal rate, regular rhythm and normal heart sounds.  Exam reveals no gallop and no friction rub.  No murmur heard.  PULMONARY/CHEST: Effort normal and breath sounds normal. No respiratory distress.  ABDOMINAL:  Soft. There is no distension. There is no tenderness. There is no rebound and no guarding.  MUSCULOSKELETAL: No clubbing, cyanosis, or lower extremity edema.  PSYCHIATRIC: Alert and oriented.  Normal mood and affect.  NEUROLOGIC: No focal motor deficit. Normal gait.  SKIN: Skin is warm, dry, and intact.      Results/Orders:    Lab on 07/07/2022   Component Date Value Ref Range Status    PSA 07/07/2022 13.43 (H)  0.00 - 4.00 ng/mL Final    WBC 07/07/2022 6.1  3.6 - 11.2 10*9/L Final    RBC 07/07/2022 4.59  4.26 - 5.60 10*12/L Final    HGB 07/07/2022 14.2  12.9 - 16.5 g/dL Final    HCT 28/41/3244 41.2  39.0 - 48.0 % Final    MCV 07/07/2022 89.6  77.6 - 95.7 fL Final    MCH 07/07/2022 31.0  25.9 - 32.4 pg Final  MCHC 07/07/2022 34.6  32.0 - 36.0 g/dL Final    RDW 16/08/9603 13.8  12.2 - 15.2 % Final    MPV 07/07/2022 7.5  6.8 - 10.7 fL Final    Platelet 07/07/2022 238  150 - 450 10*9/L Final    Neutrophils % 07/07/2022 62.9  % Final    Lymphocytes % 07/07/2022 30.2  % Final    Monocytes % 07/07/2022 4.9  % Final    Eosinophils % 07/07/2022 1.0  % Final    Basophils % 07/07/2022 1.0  % Final    Absolute Neutrophils 07/07/2022 3.9  1.8 - 7.8 10*9/L Final    Absolute Lymphocytes 07/07/2022 1.9  1.1 - 3.6 10*9/L Final    Absolute Monocytes 07/07/2022 0.3  0.3 - 0.8 10*9/L Final    Absolute Eosinophils 07/07/2022 0.1  0.0 - 0.5 10*9/L Final    Absolute Basophils 07/07/2022 0.1  0.0 - 0.1 10*9/L Final       Lab Results Component Value Date    PSA 13.43 (H) 07/07/2022    PSA 14.24 (H) 06/18/2022    PSA 18.05 (H) 04/29/2022         Orders placed or performed in visit on 07/07/22    PSA (Prostate Specific Antigen)    CBC w/ Differential    CBC w/ Differential       Molecular Pathology  Tumor mutation profiling  N/A  Germline testing  N/A    Pathology:  A: Prostate, right apex, core biopsy  - Prostatic adenocarcinoma, Gleason score 4 + 4 = 8 (Grade group 4) involving 2 of 2 cores, approximately 4 and 1 mm in linear extent, approximately 20% of total core length.      B: Prostate, left apex, core biopsy  - Prostatic adenocarcinoma, Gleason score 4 + 4 = 8 (Grade group 4) involving 2 of 2 cores, approximately 7 and 5 mm in linear extent, approximately 60% of total core length.   - Perineural invasion identified in this case.      C: Prostate, left mid, core biopsy  - Prostatic adenocarcinoma, Gleason score 4 + 5 = 9 (Grade group 5) involving 2 of 2 cores, approximately 6 and 3mm in linear extent, approximately 90% of total core length.      D: Prostate, right mid, core biopsy  - Prostatic adenocarcinoma, Gleason score 4 + 5 = 9 (Grade group 5) involving 2 of 2 cores, approximately 6 and 5 mm in linear extent, approximately 70% of total core length.      E: Prostate, right base, core biopsy  - Benign prostate tissue.      F: Prostate, left base, core biopsy  - Prostatic adenocarcinoma, Gleason score 4 + 4 = 8 (Grade group 4)  involving 2 of 2 cores, approximately 3 and 1 mm in linear extent, approximately 20% of total core length.      G: Bladder neck and bilateral median lobe, transurethral resection  - Involved by prostatic adenocarcinoma with mixed acinar and ductal features, Gleason score 4 + 4 = 8 (Grade group 4)    - Lymphovascular invasion identified     The pattern 4 in this case demonstrates cribriform morphology.      Imaging results:    CT Chest w Contrast 06/11/22:   No intrathoracic metastasis.       CT Abdomen Pelvis W Contrast 06/11/22:     Enlarged irregular prostate correlates with history of biopsy-proven prostate cancer. There is redemonstrated irregularity of the posterior  bladder and increased irregularity of the prostate at the bladder margin compared to prior may be secondary to tumor versus postsurgical/post TURP change. Consider attention on follow-up versus further evaluation with PSMA PET scan.       Questionable sclerotic changes of the right inferior pubic ramus are indeterminate and may represent normal variation although metastatic osseous disease is not excluded. Attention on follow-up bone scan. There is no other evidence of metastatic disease in the abdomen/pelvis.       Additional chronic and incidental findings as described.       Please see same day CT chest report for detailed findings above the diaphragm     NM Bone Scan Whole Body 06/11/22  No findings suggestive of osseous metastatic disease         Answers submitted by the patient for this visit:  Painful Urination Questionnaire (Submitted on 07/07/2022)  Chief Complaint: Dysuria  Chronicity: chronic  Onset: more than 1 month ago  Frequency: every urination  Progression since onset: gradually worsening  Pain quality: burning  Pain - numeric: 10/10  Fever: no fever  Sexually active?: No  History of pyelonephritis?: Yes  chills: No  discharge: No  flank pain: Yes  frequency: Yes  hematuria: Yes  hesitancy: Yes  nausea: No  possible pregnancy: No  sweats: No  urgency: Yes  vomiting: No

## 2022-07-08 LAB — HEPATIC FUNCTION PANEL
ALBUMIN: 3.5 g/dL (ref 3.4–5.0)
ALKALINE PHOSPHATASE: 78 U/L (ref 46–116)
ALT (SGPT): 14 U/L (ref 10–49)
AST (SGOT): 17 U/L (ref <=34)
BILIRUBIN DIRECT: 0.1 mg/dL (ref 0.00–0.30)
BILIRUBIN TOTAL: 0.3 mg/dL (ref 0.3–1.2)
PROTEIN TOTAL: 6 g/dL (ref 5.7–8.2)

## 2022-07-08 LAB — POTASSIUM: POTASSIUM: 4.5 mmol/L (ref 3.5–5.1)

## 2022-07-08 NOTE — Unmapped (Signed)
[facility] Specialty Medication Onboarding    Specialty Medication: Abiraterone  Prior Authorization: Not Required   Financial Assistance: No - copay  <$25  Final Copay/Day Supply: $0 / 30    Insurance Restrictions: None     Notes to Pharmacist: Prednisone $0 for 30 days.    The triage team has completed the benefits investigation and has determined that the patient is able to fill this medication at [facility]. Please contact the patient to complete the onboarding or follow up with the prescribing physician as needed.

## 2022-07-09 NOTE — Unmapped (Signed)
[facility]   Patient Onboarding/Medication Counseling    Patrick Montgomery is a 61 y.o. male with prostate cancer who I am counseling today on initiation of therapy.  I am speaking to the patient's family member, Patrick Montgomery, wife/domestic partner .    Was a Nurse, learning disability used for this call? No    Verified patient's date of birth / HIPAA.    Specialty medication(s) to be sent: Hematology/Oncology: abiraterone 250 mg    Non-specialty medications/supplies to be sent: prednisone 5 mg    Medications not needed at this time: none     Zytiga (Abiraterone)    Medication & Administration     Dosage: Take 4 tablets (1000mg ) by mouth once daily.     Administration:   Take on an empty stomach (at least 1 hour before or 2 hours after food)  Take with prednisone  Take with calcium and vitamin D    Adherence/Missed dose instructions:  If miss a dose, wait until next day to take normal dose    Goals of Therapy     Prevent disease progression    Side Effects & Monitoring Parameters     Common side effects  Flushing/hot flashes  Signs of high blood pressure  (bad headache, dizziness/passing out, or change in eyesight)  Muscle pain  Joint pain or swelling  Heartburn  Cough, stuffy nose, sore throat  Upset stomach, nausea or vomiting  Diarrhea or constipation  Feeling weak or tired  Trouble sleeping    The following side effects should be reported to the provider:  Allergic reaction  Electrolyte problems (mood changes, confusion, muscle pain, abnormal heartbeat, seizures)  Signs of weak adrenal gland (bad upset stomach or throwing up, very bad dizziness or passing out, muscle weakness, feeling very tired, mood changes, not hungry, or weight loss)  Signs of UTI  Signs of high blood sugar (confusion, sleepiness, increased thirst, hunger, and urination, fruity breath)  Feeling weak or tired  Bruising  Bone pain  Swelling, weight gain, or trouble breathing  Liver problems (dark urine, not hungry, upset stomach, light colored stool, yellow skin or eyes    Monitoring Parameters  LFT monitoring every 2 weeks for 3 months and monthly thereafter  Serum potassium levels frequently during treatment  Monitor blood pressure    Contraindications, Warnings, & Precautions     Contraindicated in women who are or may become pregnant  Adrenocortical insufficiency  Hepatotoxicity  Mineralocorticoid excess - monitor for hypertension, hypokalemia, and fluid retention. Take with a corticosteroid.  Cardiovascular disease - control hypertension and correct hypokalemia prior to and during treatment.    Drug/Food Interactions     Medication list reviewed in Epic. The patient was instructed to inform the care team before taking any new medications or supplements.  Abiraterone Acetate may enhance the myopathic (rhabdomyolysis) effect of HMG-CoA Reductase Inhibitors (Statins) - monitor for evidence of muscle toxicities.  2. Abiraterone, CYP2D6 Inhibitors (Moderate) may increase the serum concentration of Tamsulosin - monitor for increased tamsulosin effects  .   List any food/vaccine restrictions here.     Storage, Handling Precautions, & Disposal     Store at room temperature  This drug is considered hazardous and should be handled as little as possible.  If someone else helps with medication administration, they should wear gloves.    Current Medications (including OTC/herbals), Comorbidities and Allergies     Current Outpatient Medications   Medication Sig Dispense Refill    abiraterone (ZYTIGA) 250 mg Tab  tablet Take 4 tablets (1,000 mg total) by mouth daily. 120 tablet 11    amLODIPine (NORVASC) 5 MG tablet Take 1 tablet (5 mg total) by mouth daily. 90 tablet 3    aspirin (ECOTRIN) 81 MG tablet Take 1 tablet (81 mg total) by mouth daily. 90 tablet 3    atorvastatin (LIPITOR) 10 MG tablet Take 1 tablet (10 mg total) by mouth daily. 90 tablet 3    cyclobenzaprine (FLEXERIL) 10 MG tablet Take 1 tablet (10 mg total) by mouth nightly as needed for muscle spasms. 30 tablet 0    docusate sodium (COLACE) 100 MG capsule Take 1 capsule (100 mg total) by mouth Two (2) times a day. 60 capsule 11    oxyCODONE (ROXICODONE) 5 MG immediate release tablet Take 1 tablet (5 mg total) by mouth every four (4) hours as needed for pain. 30 tablet 0    polyethylene glycol (MIRALAX) 17 gram packet Take 17 g by mouth Two (2) times a day. 100 packet 2    predniSONE (DELTASONE) 5 MG tablet Take 1 tablet (5 mg total) by mouth daily. 30 tablet 11    SENNA 8.6 mg tablet Take 1 tablet by mouth daily. 30 tablet 5    tamsulosin (FLOMAX) 0.4 mg capsule Take 1 capsule (0.4 mg total) by mouth daily. 30 capsule 11     No current facility-administered medications for this visit.       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    Appropriateness of Therapy     Acute infections noted within Epic:  No active infections  Patient reported infection: None    Is medication and dose appropriate based on diagnosis and infection status? Yes    Prescription has been clinically reviewed: Yes    Baseline Quality of Life Assessment      How many days over the past month did your prostate cancer  keep you from your normal activities? For example, brushing your teeth or getting up in the morning. 0    Financial Information     Medication Assistance provided: None Required    Anticipated copay of $0 / 30 days for both prednisone and abiraterone reviewed with patient. Verified delivery address.    Delivery Information     Scheduled delivery date: 07/10/22    Expected start date: ASAP    Medication will be delivered via Next Day Courier to the prescription address in [facility].  This shipment will not require a signature.      Explained the services we provide at [facility] and that each month we would call to set up refills.  Stressed importance of returning phone calls so that we could ensure they receive their medications in time each month.  Informed patient that we should be setting up refills 7-10 days prior to when they will run out of medication.  A pharmacist will reach out to  perform a clinical assessment periodically.  Informed patient that a welcome packet, containing information about our pharmacy and other support services, a Notice of Privacy Practices, and a drug information handout will be sent.      The patient or caregiver noted above participated in the development of this care plan and knows that they can request review of or adjustments to the care plan at any time.      Patient or caregiver verbalized understanding of the above information as well as how to contact the pharmacy at [phone] option 4 with any questions/concerns.  The pharmacy is open Monday through Friday 8:30am-4:30pm.  A pharmacist is available 24/7 via pager to answer any clinical questions they may have.    Patient Specific Needs     Does the patient have any physical, cognitive, or cultural barriers? No    Does the patient have adequate living arrangements? (i.e. the ability to store and take their medication appropriately) Yes    Did you identify any home environmental safety or security hazards? No    Patient prefers to have medications discussed with   patient or wife      Is the patient or caregiver able to read and understand education materials at a high school level or above? Yes    Patient's primary language is  English     Is the patient high risk? Yes, patient is taking oral chemotherapy. Appropriateness of therapy as been assessed    SOCIAL DETERMINANTS OF HEALTH     At the [facility], we have learned that life circumstances - like trouble affording food, housing, utilities, or transportation can affect the health of many of our patients.   That is why we wanted to ask: are you currently experiencing any life circumstances that are negatively impacting your health and/or quality of life? Patient declined to answer    Social Determinants of Health     Financial Resource Strain: Low Risk  (05/06/2022)    Overall Financial Resource Strain (CARDIA)     Difficulty of Paying Living Expenses: Not hard at all   Internet Connectivity: No Internet connectivity concern identified (05/06/2022)    Internet Connectivity     Do you have access to internet services: Yes     How do you connect to the internet: Personal Device at home     Is your internet connection strong enough for you to watch video on your device without major problems?: Yes     Do you have enough data to get through the month?: Yes     Does at least one of the devices have a camera that you can use for video chat?: Yes   Food Insecurity: No Food Insecurity (05/06/2022)    Hunger Vital Sign     Worried About Running Out of Food in the Last Year: Never true     Ran Out of Food in the Last Year: Never true   Tobacco Use: High Risk (06/22/2022)    Patient History     Smoking Tobacco Use: Every Day     Smokeless Tobacco Use: Never     Passive Exposure: Not on file   Housing/Utilities: Low Risk  (05/06/2022)    Housing/Utilities     Within the past 12 months, have you ever stayed: outside, in a car, in a tent, in an overnight shelter, or temporarily in someone else's home (i.e. couch-surfing)?: No     Are you worried about losing your housing?: No  Within the past 12 months, have you been unable to get utilities (heat, electricity) when it was really needed?: No   Alcohol Use: Not on file   Transportation Needs: No Transportation Needs (05/06/2022)    PRAPARE - Therapist, art (Medical): No     Lack of Transportation (Non-Medical): No   Substance Use: Not on file   Health Literacy: Not on file   Physical Activity: Not on file   Interpersonal Safety: Not at risk (05/06/2022)    Interpersonal Safety     Unsafe Where You Currently Live: No     Physically Hurt by Anyone: No     Abused by Anyone: No   Stress: Not on file   Intimate Partner Violence: Not At Risk (05/06/2022)    Humiliation, Afraid, Rape, and Kick questionnaire     Fear of Current or Ex-Partner: No     Emotionally Abused: No     Physically Abused: No     Sexually Abused: No   Depression: Not at risk (06/22/2022)    PHQ-2     PHQ-2 Score: 0   Social Connections: Not on file       Would you be willing to receive help with any of the needs that you have identified today? Not applicable       Ashlynn Gunnels A Sh[facility] Specialty Pharmacist

## 2022-07-09 NOTE — Unmapped (Signed)
Called patient to check on his dizziness reported via my chart message. Spouse states he is currently out with his mother but she plans to call me back with an update.

## 2022-07-15 DIAGNOSIS — C61 Malignant neoplasm of prostate: Principal | ICD-10-CM

## 2022-07-15 NOTE — Unmapped (Signed)
Called and spoke with patient and spouse about plan of care.  All questions answered.

## 2022-07-17 NOTE — Unmapped (Signed)
Called to find out the exact date he started Abiraterone so I will know when his labs are due for monitoring.    Need to inform patient on the day of his scan on 07/27/22, he is to have nothing to eat or drink expect water 6 hour prior to scan.    Left message for patient to return call.

## 2022-07-24 ENCOUNTER — Ambulatory Visit: Admit: 2022-07-24 | Discharge: 2022-07-25 | Payer: PRIVATE HEALTH INSURANCE

## 2022-07-24 LAB — POTASSIUM: POTASSIUM: 4.5 mmol/L (ref 3.5–5.1)

## 2022-07-24 LAB — HEPATIC FUNCTION PANEL
ALBUMIN: 3.9 g/dL (ref 3.4–5.0)
ALKALINE PHOSPHATASE: 102 U/L (ref 46–116)
ALT (SGPT): 31 U/L (ref 10–49)
AST (SGOT): 23 U/L (ref <=34)
BILIRUBIN DIRECT: 0.1 mg/dL (ref 0.00–0.30)
BILIRUBIN TOTAL: 0.4 mg/dL (ref 0.3–1.2)
PROTEIN TOTAL: 6.5 g/dL (ref 5.7–8.2)

## 2022-07-24 LAB — PSA: PROSTATE SPECIFIC ANTIGEN: 1.66 ng/mL (ref 0.00–4.00)

## 2022-07-27 ENCOUNTER — Ambulatory Visit: Admit: 2022-07-27 | Discharge: 2022-07-28 | Payer: PRIVATE HEALTH INSURANCE

## 2022-07-27 MED ADMIN — F-18 Piflufolastat (Pylarify): 8.9 | INTRAVENOUS | @ 18:00:00 | Stop: 2022-07-27

## 2022-08-04 MED FILL — ABIRATERONE 250 MG TABLET: ORAL | 30 days supply | Qty: 120 | Fill #1

## 2022-08-04 MED FILL — PREDNISONE 5 MG TABLET: ORAL | 30 days supply | Qty: 30 | Fill #1

## 2022-08-04 NOTE — Unmapped (Signed)
[facility] Clinical Assessment & Refill Coordination Note    Patrick Montgomery, DOB: [birth year]  Phone: [phone] (home)     All above HIPAA information was verified with patient's family member, domestic partner, Belenda Cruise.     Was a Nurse, learning disability used for this call? No    Specialty Medication(s):   Hematology/Oncology: abiraterone 250 mg     Current Outpatient Medications   Medication Sig Dispense Refill    abiraterone (ZYTIGA) 250 mg tablet Take 4 tablets (1,000 mg total) by mouth daily. 120 tablet 11    amLODIPine (NORVASC) 5 MG tablet Take 1 tablet (5 mg total) by mouth daily. 90 tablet 3    aspirin (ECOTRIN) 81 MG tablet Take 1 tablet (81 mg total) by mouth daily. 90 tablet 3    atorvastatin (LIPITOR) 10 MG tablet Take 1 tablet (10 mg total) by mouth daily. 90 tablet 3    cyclobenzaprine (FLEXERIL) 10 MG tablet Take 1 tablet (10 mg total) by mouth nightly as needed for muscle spasms. 30 tablet 0    docusate sodium (COLACE) 100 MG capsule Take 1 capsule (100 mg total) by mouth Two (2) times a day. 60 capsule 11    oxyCODONE (ROXICODONE) 5 MG immediate release tablet Take 1 tablet (5 mg total) by mouth every four (4) hours as needed for pain. 30 tablet 0    polyethylene glycol (MIRALAX) 17 gram packet Take 17 g by mouth Two (2) times a day. 100 packet 2    predniSONE (DELTASONE) 5 MG tablet Take 1 tablet (5 mg total) by mouth daily. 30 tablet 11    SENNA 8.6 mg tablet Take 1 tablet by mouth daily. 30 tablet 5    tamsulosin (FLOMAX) 0.4 mg capsule Take 1 capsule (0.4 mg total) by mouth daily. 30 capsule 11     No current facility-administered medications for this visit.        Changes to medications: Ori reports no changes at this time.    No Known Allergies    Changes to allergies: No    SPECIALTY MEDICATION ADHERENCE     abiraterone 250 mg: unsure how many days of medicine on hand - she was at work    Medication Adherence    Patient reported X missed doses in the last month: 0  Specialty Medication: abiraterone 250 mg - 4 tabs (1,000 mg) once daily  Patient is on additional specialty medications: No  Informant: significant other                  Confirmed plan for next specialty medication refill: delivery by pharmacy  Refills needed for supportive medications: not needed          Specialty medication(s) dose(s) confirmed: Regimen is correct and unchanged.     Are there any concerns with adherence? No    Adherence counseling provided? Not needed    CLINICAL MANAGEMENT AND INTERVENTION      Clinical Benefit Assessment:    Do you feel the medicine is effective or helping your condition? Yes    Clinical Benefit counseling provided? Not needed    Adverse Effects Assessment:    Are you experiencing any side effects? No    Are you experiencing difficulty administering your medicine? No    Quality of Life Assessment:    Quality of Life    Rheumatology  Oncology  1. What impact has your specialty medication had on the reduction of your daily pain or discomfort level?:  None  2. On a scale of 1-10, how would you rate your ability to manage side effects associated with your specialty medication? (1=no issues, 10 = unable to take medication due to side effects): 2  Dermatology  Cystic Fibrosis          How many days over the past month did your prostate cancer  keep you from your normal activities? For example, brushing your teeth or getting up in the morning. 0    Have you discussed this with your provider? Not needed    Acute Infection Status:    Acute infections noted within Epic:  No active infections  Patient reported infection: None    Therapy Appropriateness:    Is therapy appropriate and patient progressing towards therapeutic goals? Yes, therapy is appropriate and should be continued    DISEASE/MEDICATION-SPECIFIC INFORMATION      N/A    PATIENT SPECIFIC NEEDS     Does the patient have any physical, cognitive, or cultural barriers? No    Is the patient high risk? Yes, patient is taking oral chemotherapy. Appropriateness of therapy as been assessed    Does the patient require a Care Management Plan? No     SOCIAL DETERMINANTS OF HEALTH     At the [facility], we have learned that life circumstances - like trouble affording food, housing, utilities, or transportation can affect the health of many of our patients.   That is why we wanted to ask: are you currently experiencing any life circumstances that are negatively impacting your health and/or quality of life? Patient declined to answer    Social Determinants of Health     Financial Resource Strain: Low Risk  (05/06/2022)    Overall Financial Resource Strain (CARDIA)     Difficulty of Paying Living Expenses: Not hard at all   Internet Connectivity: No Internet connectivity concern identified (05/06/2022)    Internet Connectivity     Do you have access to internet services: Yes     How do you connect to the internet: Personal Device at home     Is your internet connection strong enough for you to watch video on your device without major problems?: Yes     Do you have enough data to get through the month?: Yes     Does at least one of the devices have a camera that you can use for video chat?: Yes   Food Insecurity: No Food Insecurity (05/06/2022)    Hunger Vital Sign     Worried About Running Out of Food in the Last Year: Never true     Ran Out of Food in the Last Year: Never true   Tobacco Use: High Risk (06/22/2022)    Patient History     Smoking Tobacco Use: Every Day     Smokeless Tobacco Use: Never     Passive Exposure: Not on file   Housing/Utilities: Low Risk  (05/06/2022)    Housing/Utilities     Within the past 12 months, have you ever stayed: outside, in a car, in a tent, in an overnight shelter, or temporarily in someone else's home (i.e. couch-surfing)?: No     Are you worried about losing your housing?: No     Within the past 12 months, have you been unable to get utilities (heat, electricity) when it was really needed?: No   Alcohol Use: Not on file   Transportation Needs: No Transportation Needs (05/06/2022)    PRAPARE - Transportation  Lack of Transportation (Medical): No     Lack of Transportation (Non-Medical): No   Substance Use: Not on file   Health Literacy: Not on file   Physical Activity: Not on file   Interpersonal Safety: Not at risk (05/06/2022)    Interpersonal Safety     Unsafe Where You Currently Live: No     Physically Hurt by Anyone: No     Abused by Anyone: No   Stress: Not on file   Intimate Partner Violence: Not At Risk (05/06/2022)    Humiliation, Afraid, Rape, and Kick questionnaire     Fear of Current or Ex-Partner: No     Emotionally Abused: No     Physically Abused: No     Sexually Abused: No   Depression: Not at risk (06/22/2022)    PHQ-2     PHQ-2 Score: 0   Social Connections: Not on file     Would you be willing to receive help with any of the needs that you have identified today? Not applicable     SHIPPING     Specialty Medication(s) to be Shipped:   Hematology/Oncology: abiraterone 250 mg    Other medication(s) to be shipped:  prednisone     Changes to insurance: No    Delivery Scheduled: Yes, Expected medication delivery date: 08/05/22.     Medication will be delivered via Next Day Courier to the confirmed prescription address in [facility].    The patient will receive a drug information handout for each medication shipped and additional FDA Medication Guides as required.  Verified that patient has previously received a Conservation officer, historic buildings and a Surveyor, mining.    The patient or caregiver noted above participated in the development of this care plan and knows that they can request review of or adjustments to the care plan at any time.      All of the patient's questions and concerns have been addressed.    Breck Coons [facility] Specialty Pharmacist

## 2022-08-07 ENCOUNTER — Ambulatory Visit: Admit: 2022-08-07 | Discharge: 2022-08-08 | Payer: PRIVATE HEALTH INSURANCE

## 2022-08-07 LAB — PSA: PROSTATE SPECIFIC ANTIGEN: 0.27 ng/mL (ref 0.00–4.00)

## 2022-08-07 LAB — HEPATIC FUNCTION PANEL
ALBUMIN: 3.5 g/dL (ref 3.4–5.0)
ALKALINE PHOSPHATASE: 96 U/L (ref 46–116)
ALT (SGPT): 19 U/L (ref 10–49)
AST (SGOT): 17 U/L (ref <=34)
BILIRUBIN DIRECT: 0.2 mg/dL (ref 0.00–0.30)
BILIRUBIN TOTAL: 0.4 mg/dL (ref 0.3–1.2)
PROTEIN TOTAL: 6.2 g/dL (ref 5.7–8.2)

## 2022-08-07 LAB — POTASSIUM: POTASSIUM: 3.9 mmol/L (ref 3.5–5.1)

## 2022-08-08 NOTE — Unmapped (Signed)
 [facility] Medical Oncology Visit Note  New Patient    Patient Name: Patrick Montgomery  Patient Age: 61 y.o.  Encounter Date: 08/11/2022  Attending Provider:  Heron Pitcock E. 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    Patrick Montgomery is a 61 y.o. male who presents for management of newly diagnosed prostate cancer. He was initially seen in the ER In May 2023 for gross hematuria and  worsening LUTS. A CT scan of the bladder revealed a bladder mass. In June 2023, he was found to have a PSA of 18. He underwent cystoscopy, prostate biopsy, and TURP. Prostate biopsy showed Gleason 4+5=9 (GG 5), 10/12 cores positive for cancer. TURP specimen showed mixed acinar/ductal features, cribriborm morphology. In July 2023, CT and bone scan showed no pelvic or distant metastases.    On 06/18/22, we discussed management of his recent diagnosis of prostate cancer. Given that he has been diagnosed with prostate cancer at a very [place] age, I informed the patient that there may be features that indicate a higher risk of hereditary prostate cancer. 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   Based on current imaging, his cancer is confined to the prostate. A PSMA PET scan will be ordered to see if the cancer has metastasized.     On 07/07/2022, pt is now ready to move ahead with treatment. Will proceed with ADT plus abiraterone  for two years, per [facility].  PSMA PET still undergoing authorization.    Today, on 08/11/2022, most recent PSA on 08/01/2022 is 0.27. PSMA PET scan from 07/27/2022 showed avidity involving the right inferior pubic ramus which is suspicious for single site of osseous metastatic disease. Will proceed with Eligard  shot today and proceed with radiation with Dr. Pearlstein. May consider radiating single site of metastatic disease at the right inferior pubic ramus     Plan:  Continue ADT with Eligard . Anticipate 3 years of therapy (till 8-07/2025)  -- Eligard  shot today 08/11/2022, next due 02/09/2023  -- Follow PSA  Continue Abiraterone  and prednisone    -- Follow labs/BP per our [facility]  -- Continue labs every 2 weeks for the first 3 months until early Dec 2023  Follow up with radiation oncology as scheduled for 09/09/2022 with Dr. Keane at [facility]   -- May consider additional radiation to right inferior pubic ramus site of metastatic disease   5. Somatic tumor testing and germline testing to be discussed in the future.  6. Return in 4 weeks for APP visit. Return with me in 8 weeks    I personally spent 40 minutes face-to-face and non-face-to-face in the care of this patient, which includes all pre, intra, and post visit time on the date of service.  All documented time was specific to the E/M visit and does not include any procedures that may have been performed.  Reason for Visit  Follow up of prostate cancer.     History of Present Illness:  Oncology History Overview Note   In 03/2022, seen in ER for gross hematuria, worsening LUTS. CT bladder showing bladder mass.  In 04/2022, PSA 18. Had cysto, prostate biopsy, TURP. Prostate biopsy showed Gleason 4+5=9 (GG 5), 10/12 cores positive for cancer. TURP specimen showed mixed acinar/ductal features, cribriborm morphology.  In 05/2022, CT and bone scan showed no pelvic or distant mets  In 06/2022, ADT started with Degarelix, continued with Eligard .  In 06/2022, PSMA PET showed avidity in R inferior pubic ramus, suspicious for single bone met.  In 07/2022, abiraterone /prednisone  started.     Prostate cancer (CMS-HCC)   06/17/2022 Initial Diagnosis    Prostate cancer (CMS-HCC)     06/17/2022 -  Cancer Staged    Staging form: Prostate, AJCC 8th Edition  - Clinical: Stage IIIC (cT4, cN0, cM0, PSA: 18, Grade Group: 5) - Signed by Neysa FORBES Kras, MD on 06/19/2022       07/07/2022 - 07/07/2022 Endocrine/Hormone Therapy    OP DEGARELIX  Plan Provider: Neysa FORBES Kras, MD     08/11/2022 Endocrine/Hormone Therapy    OP PROSTATE LEUPROLIDE  (ELIGARD ) 45 MG EVERY 6 MONTHS  Plan Provider: Neysa FORBES Kras, MD         Interval history  The patient returns to clinic, unaccompanied. He received his Abiraterone  about 2-3 weeks ago and started this along with the prednisone . Energy level and appetite have been good and he feels everything is stable. Reports a constant pain in the right perineum/thigh, that is stable since first noticing it prior to his last appt. No bladder or bowel dysfunction. He is having daily/nightly hot flashes he feels are stable.     Allergies:  No Known Allergies    Current Medications:    Current Outpatient Medications:     abiraterone  (ZYTIGA ) 250 mg tablet, Take 4 tablets (1,000 mg total) by mouth daily., Disp: 120 tablet, Rfl: 11    amLODIPine  (NORVASC ) 5 MG tablet, Take 1 tablet (5 mg total) by mouth daily., Disp: 90 tablet, Rfl: 3    aspirin  (ECOTRIN) 81 MG tablet, Take 1 tablet (81 mg total) by mouth daily., Disp: 90 tablet, Rfl: 3    atorvastatin  (LIPITOR) 10 MG tablet, Take 1 tablet (10 mg total) by mouth daily., Disp: 90 tablet, Rfl: 3    cyclobenzaprine  (FLEXERIL ) 10 MG tablet, Take 1 tablet (10 mg total) by mouth nightly as needed for muscle spasms., Disp: 30 tablet, Rfl: 0    docusate sodium  (COLACE) 100 MG capsule, Take 1 capsule (100 mg total) by mouth Two (2) times a day., Disp: 60 capsule, Rfl: 11    oxyCODONE  (ROXICODONE ) 5 MG immediate release tablet, Take 1 tablet (5 mg total) by mouth every four (4) hours as needed for pain., Disp: 30 tablet, Rfl: 0 polyethylene glycol (MIRALAX ) 17 gram packet, Take 17 g by mouth Two (2) times a day., Disp: 100 packet, Rfl: 2    predniSONE  (DELTASONE ) 5 MG tablet, Take 1 tablet (5 mg total) by mouth daily., Disp: 30 tablet, Rfl: 11    SENNA 8.6 mg tablet, Take 1 tablet by mouth daily., Disp: 30 tablet, Rfl: 5    tamsulosin  (FLOMAX ) 0.4 mg capsule, Take 1 capsule (0.4 mg total) by mouth daily., Disp: 30 capsule, Rfl: 11    Past Medical History and Social History  Past Medical History:   Diagnosis Date    Anxiety  Arthritis     Basal cell carcinoma     Cancer (CMS-HCC)     skin    Gout     Kidney stones     Squamous cell skin cancer     Stroke (CMS-HCC)       Past Surgical History:   Procedure Laterality Date    PR BIOPSY OF PROSTATE,NEEDLE/PUNCH N/A 05/21/2022    Procedure: IMAGE GUIDED BIOPSY, PROSTATE; NEEDLE OR PUNCH, SINGLE OR MULTIPLE, ANY APPROACH;  Surgeon: Alm Bettyann Louder, MD;  Location: [facility] OR [facility];  Service: Urology    PR TRANSURETHRAL ELEC-SURG PROSTATECTOM N/A 05/21/2022    Procedure: TRANSURETHRAL ELECTROSURGICAL RESECT PROSTATE, W/CONTORL POSTOP BLEED, COMPLETE (VASECT, MEATOT, CYSTO ETC);  Surgeon: Alm Bettyann Louder, MD;  Location: [facility] OR [facility];  Service: Urology    SKIN BIOPSY      skin cancer removal           Social History     Occupational History    Not on file   Tobacco Use    Smoking status: Every Day    Smokeless tobacco: Never   Substance and Sexual Activity    Alcohol use: No     Alcohol/week: 0.0 standard drinks of alcohol     Comment: socially     Drug use: Not Currently     Types: Marijuana    Sexual activity: Not on file       Family History  Family History   Problem Relation Age of Onset    Cancer Father         Skin    Cancer Brother         skin     Substance Abuse Disorder Neg Hx      Prostate Cancer Family History Assessment:  History of cancer in children (yes/no; if yes, what type AND age of diagnosis): No  Total number of siblings (including deceased): 2  History of cancer in siblings (yes/no; if yes, provide relation, type of cancer, AND age of diagnosis): Brother superficial basal cell; age unknown  History of cancer in parents (yes/no; if yes, please specify parent, type of cancer, AND age of diagnosis): Father superficial basal cell carcinoma; age unknown  History of cancer in aunts/uncles/grandparents (yes/no; if yes, provide relation, type of cancer, AND age of diagnosis): No      Review of Systems:  A comprehensive review of 10 systems was negative except for pertinent positives noted in HPI.    Physical Exam:    VITAL SIGNS:  BP 141/82  - Pulse 64  - Temp 36.8 ??C (98.2 ??F) (Temporal)  - Resp 14  - Ht 167.6 cm (5' 6)  - Wt 65.7 kg (144 lb 12.8 oz)  - SpO2 98%  - BMI 23.37 kg/m??   ECOG Performance Status: 1  GENERAL: Well-developed, well-nourished patient in no acute distress.  HEAD: Normocephalic and atraumatic.  EYES: Conjunctivae are normal. No scleral icterus.  MOUTH/THROAT: Oropharynx is clear and moist.  No mucosal lesions.  NECK: Supple, no thyromegaly.  LYMPHATICS: No palpable cervical, supraclavicular, or axillary adenopathy.  CARDIOVASCULAR: Normal rate, regular rhythm and normal heart sounds.  Exam reveals no gallop and no friction rub.  No murmur heard.  PULMONARY/CHEST: Effort normal and breath sounds normal. No respiratory distress.  ABDOMINAL:  Soft. There is no distension. There is no tenderness. There is no rebound and no guarding.  MUSCULOSKELETAL: No clubbing, cyanosis, or lower extremity edema.  PSYCHIATRIC: Alert and oriented.  Normal mood  and affect.  NEUROLOGIC: No focal motor deficit. Normal gait.  SKIN: Skin is warm, dry, and intact.      Results/Orders:    Appointment on 08/07/2022   Component Date Value Ref Range Status    PSA 08/07/2022 0.27  0.00 - 4.00 ng/mL Final    Potassium 08/07/2022 3.9  3.5 - 5.1 mmol/L Final    Albumin 08/07/2022 3.5  3.4 - 5.0 g/dL Final    Total Protein 08/07/2022 6.2  5.7 - 8.2 g/dL Final    Total Bilirubin 08/07/2022 0.4  0.3 - 1.2 mg/dL Final    Bilirubin, Direct 08/07/2022 0.20  0.00 - 0.30 mg/dL Final    AST 90/91/7976 17  <=34 U/L Final    ALT 08/07/2022 19  10 - 49 U/L Final    Alkaline Phosphatase 08/07/2022 96  46 - 116 U/L Final       Lab Results   Component Value Date    PSA 0.27 08/07/2022    PSA 1.66 07/24/2022    PSA 13.43 (H) 07/07/2022    PSA 14.24 (H) 06/18/2022    PSA 18.05 (H) 04/29/2022         Orders placed or performed in visit on 08/11/22    Clinic Appointment Request Physician, Lab    Clinic Appointment Request Physician    Clinic Appointment Request APP    LAB Appointment Request *Canceled*    LAB Appointment Request       Molecular Pathology  Tumor mutation profiling  N/A  Germline testing  N/A    Pathology:  A: Prostate, right apex, core biopsy  - Prostatic adenocarcinoma, Gleason score 4 + 4 = 8 (Grade group 4) involving 2 of 2 cores, approximately 4 and 1 mm in linear extent, approximately 20% of total core length.      B: Prostate, left apex, core biopsy  - Prostatic adenocarcinoma, Gleason score 4 + 4 = 8 (Grade group 4) involving 2 of 2 cores, approximately 7 and 5 mm in linear extent, approximately 60% of total core length.   - Perineural invasion identified in this case.      C: Prostate, left mid, core biopsy  - Prostatic adenocarcinoma, Gleason score 4 + 5 = 9 (Grade group 5) involving 2 of 2 cores, approximately 6 and 3mm in linear extent, approximately 90% of total core length.      D: Prostate, right mid, core biopsy  - Prostatic adenocarcinoma, Gleason score 4 + 5 = 9 (Grade group 5) involving 2 of 2 cores, approximately 6 and 5 mm in linear extent, approximately 70% of total core length.      E: Prostate, right base, core biopsy  - Benign prostate tissue.      F: Prostate, left base, core biopsy  - Prostatic adenocarcinoma, Gleason score 4 + 4 = 8 (Grade group 4)  involving 2 of 2 cores, approximately 3 and 1 mm in linear extent, approximately 20% of total core length.      G: Bladder neck and bilateral median lobe, transurethral resection  - Involved by prostatic adenocarcinoma with mixed acinar and ductal features, Gleason score 4 + 4 = 8 (Grade group 4)    - Lymphovascular invasion identified     The pattern 4 in this case demonstrates cribriform morphology.      Imaging results:    CT Chest w Contrast 06/11/22:   No intrathoracic metastasis.       CT Abdomen Pelvis W Contrast 06/11/22:  Enlarged irregular prostate correlates with history of biopsy-proven prostate cancer. There is redemonstrated irregularity of the posterior bladder and increased irregularity of the prostate at the bladder margin compared to prior may be secondary to tumor versus postsurgical/post TURP change. Consider attention on follow-up versus further evaluation with PSMA PET scan.       Questionable sclerotic changes of the right inferior pubic ramus are indeterminate and may represent normal variation although metastatic osseous disease is not excluded. Attention on follow-up bone scan. There is no other evidence of metastatic disease in the abdomen/pelvis.       Additional chronic and incidental findings as described.       Please see same day CT chest report for detailed findings above the diaphragm     NM Bone Scan Whole Body 06/11/22  No findings suggestive of osseous metastatic disease     PET CT 07/27/2022:   Impression   1.Ill-defined tracer avidity associated with the prostate, left greater than right, likely representing biopsy-proven prostate carcinoma.   2.Moderate avidity involving the right inferior pubic ramus which is suspicious for single site of osseous metastatic disease. Otherwise, no definite evidence of additional metastatic disease. More specifically, no avid adenopathy. Subcentimeter pulmonary nodules which are below the resolution of PET/CT and require continued attention on follow-up.         I attest that I, Elnor Spitz, personally documented this note while acting as scribe for Neysa FORBES Kras, MD.      Elnor Spitz, Scribe.  08/14/22  ______________________________________________      Documentation assistance provided by Medical Scribe, Elnor Spitz, who was present during the entirety of the visit. I reviewed the note below and validated all of the information provided to ensure accuracy and completeness.     Neysa FORBES Kras, MD    ------------------------------------------------------------      ------------------------------------------------------------

## 2022-08-11 ENCOUNTER — Institutional Professional Consult (permissible substitution): Admit: 2022-08-11 | Discharge: 2022-08-11 | Payer: PRIVATE HEALTH INSURANCE

## 2022-08-11 ENCOUNTER — Ambulatory Visit: Admit: 2022-08-11 | Discharge: 2022-08-11 | Payer: PRIVATE HEALTH INSURANCE

## 2022-08-11 ENCOUNTER — Ambulatory Visit
Admit: 2022-08-11 | Discharge: 2022-08-11 | Payer: PRIVATE HEALTH INSURANCE | Attending: Hematology & Oncology | Primary: Hematology & Oncology

## 2022-08-11 DIAGNOSIS — C61 Malignant neoplasm of prostate: Principal | ICD-10-CM

## 2022-08-11 MED ADMIN — leuprolide acetate (6 month) (ELIGARD) injection 45 mg: 45 mg | SUBCUTANEOUS | @ 13:00:00 | Stop: 2022-08-11

## 2022-08-11 NOTE — Unmapped (Addendum)
Lab Results   Component Value Date    PSA 0.27 08/07/2022    PSA 1.66 07/24/2022    PSA 13.43 (H) 07/07/2022    PSA 14.24 (H) 06/18/2022    PSA 18.05 (H) 04/29/2022     Please call [phone] to reach my nurse navigator Konrad Penta for any issues.    For emergencies on Nights, Weekends and Holidays  Call [phone] for help.      Griffin Basil, MD, PhD  Associate Professor of Medicine  Division of Hematology-Oncology    [facility]  Genitourinary Oncology Clinic  Nurse Navigator: Konrad Penta  Fax: [phone]

## 2022-08-11 NOTE — Unmapped (Signed)
Pt tolerated first Eligard 45mg  injection administered in L abdomen without difficulty. Verbal education provided, all questions answered. Band aid and gauze applied. Pt left [facility] ambulatory,steady gait, NAD, no questions, complaints, nor concerns voiced at d/c.

## 2022-08-21 ENCOUNTER — Ambulatory Visit: Admit: 2022-08-21 | Discharge: 2022-08-22 | Payer: PRIVATE HEALTH INSURANCE

## 2022-08-21 LAB — HEPATIC FUNCTION PANEL
ALBUMIN: 3.9 g/dL (ref 3.4–5.0)
ALKALINE PHOSPHATASE: 95 U/L (ref 46–116)
ALT (SGPT): 36 U/L (ref 10–49)
AST (SGOT): 20 U/L (ref <=34)
BILIRUBIN DIRECT: 0.2 mg/dL (ref 0.00–0.30)
BILIRUBIN TOTAL: 0.6 mg/dL (ref 0.3–1.2)
PROTEIN TOTAL: 6.4 g/dL (ref 5.7–8.2)

## 2022-08-21 LAB — POTASSIUM: POTASSIUM: 4.2 mmol/L (ref 3.5–5.1)

## 2022-08-21 LAB — PSA: PROSTATE SPECIFIC ANTIGEN: 0.14 ng/mL (ref 0.00–4.00)

## 2022-08-21 NOTE — Unmapped (Signed)
Complex Case Management  SUMMARY NOTE    Attempted to contact pt today at Cell number to introduce Complex Case Management services. Left message to return call.; 1st attempt    Discuss at next visit: Introduction to Complex Case Management      Aland Chestnutt - High Risk Care Coordinator   [facility]  [address], [address]  [place], [place] [address]  P: [phone] F: [phone]  [email or web address]

## 2022-08-26 NOTE — Unmapped (Signed)
Complex Case Management  SUMMARY NOTE    Attempted to contact pt today at Cell number to introduce Complex Case Management services. Left message to return call.; 2nd attempt, letter sent.    Discuss at next visit: No answer, letter sent      Laloni Rowton - High Risk Care Coordinator   [facility]  [address], [address]  [place], [place] [address]  P: [phone] F: [phone]  [email or web address]

## 2022-08-31 ENCOUNTER — Ambulatory Visit: Admit: 2022-08-31 | Discharge: 2022-09-10 | Payer: PRIVATE HEALTH INSURANCE

## 2022-08-31 ENCOUNTER — Ambulatory Visit
Admit: 2022-08-31 | Discharge: 2022-09-29 | Payer: PRIVATE HEALTH INSURANCE | Attending: Radiation Oncology | Primary: Radiation Oncology

## 2022-08-31 ENCOUNTER — Encounter: Admit: 2022-08-31 | Payer: PRIVATE HEALTH INSURANCE | Attending: Radiation Oncology | Primary: Radiation Oncology

## 2022-08-31 ENCOUNTER — Ambulatory Visit: Admit: 2022-08-31 | Payer: PRIVATE HEALTH INSURANCE | Attending: Internal Medicine | Primary: Internal Medicine

## 2022-08-31 ENCOUNTER — Ambulatory Visit: Admit: 2022-09-09 | Payer: PRIVATE HEALTH INSURANCE | Attending: Internal Medicine | Primary: Internal Medicine

## 2022-08-31 ENCOUNTER — Ambulatory Visit: Admit: 2022-08-31 | Payer: PRIVATE HEALTH INSURANCE | Attending: Radiation Oncology | Primary: Radiation Oncology

## 2022-09-03 MED FILL — ABIRATERONE 250 MG TABLET: ORAL | 30 days supply | Qty: 120 | Fill #2

## 2022-09-03 MED FILL — PREDNISONE 5 MG TABLET: ORAL | 30 days supply | Qty: 30 | Fill #2

## 2022-09-03 NOTE — Unmapped (Signed)
[facility] Refill Coordination Note    Specialty Medication(s) to be Shipped:   Hematology/Oncology: abiraterone 250 mg and Transplant: Prednisone 5mg     Other medication(s) to be shipped: No additional medications requested for fill at this time     Patrick Montgomery, DOB: 13-Mar-1961  Phone: [phone] (home)       All above HIPAA information was verified with patient's family member, Spouse.     Was a Nurse, learning disability used for this call? No    Completed refill call assessment today to schedule patient's medication shipment from the [facility] [phone]).  All relevant notes have been reviewed.     Specialty medication(s) and dose(s) confirmed: Regimen is correct and unchanged.   Changes to medications: Veral reports no changes at this time.  Changes to insurance: No  New side effects reported not previously addressed with a pharmacist or physician: None reported  Questions for the pharmacist: No    Confirmed patient received a Conservation officer, historic buildings and a Surveyor, mining with first shipment. The patient will receive a drug information handout for each medication shipped and additional FDA Medication Guides as required.       DISEASE/MEDICATION-SPECIFIC INFORMATION        N/A    SPECIALTY MEDICATION ADHERENCE     Medication Adherence    Patient reported X missed doses in the last month: 0  Specialty Medication: Abiraterone 250 mg  Patient is on additional specialty medications: Yes  Additional Specialty Medications: Prednisone 5mg   Patient Reported Additional Medication X Missed Doses in the Last Month: 0  Patient is on more than two specialty medications: No  Informant: spouse                       Were doses missed due to medication being on hold? No    Abiraterone 250 mg: 3 days of medicine on hand   Prednisone 5 mg: 3 days of medicine on hand       REFERRAL TO PHARMACIST     Referral to the pharmacist: Not needed      [facility]     Shipping address confirmed in Epic. Delivery Scheduled: Yes, Expected medication delivery date: 09/04/22.     Medication will be delivered via Next Day Courier to the prescription address in Epic [place].    [name] M Elisabeth Cara   [facility] Specialty Technician

## 2022-09-07 ENCOUNTER — Ambulatory Visit
Admit: 2022-09-07 | Discharge: 2022-09-08 | Payer: PRIVATE HEALTH INSURANCE | Attending: Nurse Practitioner | Primary: Nurse Practitioner

## 2022-09-07 ENCOUNTER — Other Ambulatory Visit: Admit: 2022-09-07 | Discharge: 2022-09-08 | Payer: PRIVATE HEALTH INSURANCE

## 2022-09-07 DIAGNOSIS — C61 Malignant neoplasm of prostate: Principal | ICD-10-CM

## 2022-09-07 LAB — HEPATIC FUNCTION PANEL
ALBUMIN: 3.6 g/dL (ref 3.4–5.0)
ALKALINE PHOSPHATASE: 78 U/L (ref 46–116)
ALT (SGPT): 25 U/L (ref 10–49)
AST (SGOT): 20 U/L (ref <=34)
BILIRUBIN DIRECT: 0.2 mg/dL (ref 0.00–0.30)
BILIRUBIN TOTAL: 0.5 mg/dL (ref 0.3–1.2)
PROTEIN TOTAL: 6.2 g/dL (ref 5.7–8.2)

## 2022-09-07 LAB — POTASSIUM: POTASSIUM: 4.2 mmol/L (ref 3.5–5.1)

## 2022-09-07 LAB — PSA: PROSTATE SPECIFIC ANTIGEN: 0.09 ng/mL (ref 0.00–4.00)

## 2022-09-07 NOTE — Unmapped (Signed)
[facility] Visit Note  New Patient    Patient Name: Patrick Montgomery  Patient Age: 61 y.o.  Encounter Date: 09/07/2022  Attending Provider:  Young E. 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    Patrick Montgomery is a 61 y.o. male who presents for management of newly diagnosed prostate cancer. He was initially seen in the ER In May 2023 for gross hematuria and  worsening LUTS. A CT scan of the bladder revealed a bladder mass. In June 2023, he was found to have a PSA of 18. He underwent cystoscopy, prostate biopsy, and TURP. Prostate biopsy showed Gleason 4+5=9 (GG 5), 10/12 cores positive for cancer. TURP specimen showed mixed acinar/ductal features, cribriborm morphology. In July 2023, CT and bone scan showed no pelvic or distant metastases.    On 06/18/22, we discussed management of his recent diagnosis of prostate cancer. Given that he has been diagnosed with prostate cancer at a very Patrick age, I informed the patient that there may be features that indicate a higher risk of hereditary prostate cancer. 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   Based on current imaging, his cancer is confined to the prostate. A PSMA PET scan will be ordered to see if the cancer has metastasized.     On 07/07/2022, pt is now ready to move ahead with treatment. Will proceed with ADT plus abiraterone for two years, per [facility].  PSMA PET still undergoing authorization.    on 08/11/2022, most recent PSA on 08/01/2022 is 0.27. PSMA PET scan from 07/27/2022 showed avidity involving the right inferior pubic ramus which is suspicious for single site of osseous metastatic disease. Will proceed with Eligard shot and proceed with radiation with Patrick Montgomery. May consider radiating single site of metastatic disease at the right inferior pubic ramus     Plan:  Continue ADT with Eligard. Anticipate 3 years of therapy (till 8-07/2025)  -- Eligard shot 08/11/2022, next due 02/09/2023  -- Follow PSA  Continue Abiraterone and prednisone   -- Follow labs/BP per our [facility]; will start a home log and let me know if abnormal readings  -- Continue labs every 2 weeks for the first 3 months until early Dec 2023  Follow up with radiation oncology as scheduled for 09/09/2022 with Patrick Montgomery at [facility]   -- May consider additional radiation to right inferior pubic ramus site of metastatic disease   5. Somatic tumor testing and germline testing to be discussed in the future.  6. RTC 4 weeks for labs, clinic visit; will get labs at [facility] in 2 weeks, standing orders in p lace    Will let me know if bowel habits change    I personally spent 40 minutes face-to-face and non-face-to-face in the care of this patient, which includes all pre, intra, and post visit time on  the date of service.  All documented time was specific to the E/M visit and does not include any procedures that may have been performed.    Reason for Visit  Follow up of prostate cancer.     History of Present Illness:  Oncology History Overview Note   In 03/2022, seen in ER for gross hematuria, worsening LUTS. CT bladder showing bladder mass.  In 04/2022, PSA 18. Had cysto, prostate biopsy, TURP. Prostate biopsy showed Gleason 4+5=9 (GG 5), 10/12 cores positive for cancer. TURP specimen showed mixed acinar/ductal features, cribriborm morphology.  In 05/2022, CT and bone scan showed no pelvic or distant mets  In 06/2022, ADT started with Degarelix, continued with Eligard.  In 06/2022, PSMA PET showed avidity in R inferior pubic ramus, suspicious for single bone met.  In 07/2022, abiraterone/prednisone started.     Prostate cancer (CMS-HCC)   06/17/2022 Initial Diagnosis    Prostate cancer (CMS-HCC)     06/17/2022 -  Cancer Staged    Staging form: Prostate, AJCC 8th Edition  - Clinical: Stage IIIC (cT4, cN0, cM0, PSA: 18, Grade Group: 5) - Signed by Patrick Boettcher, MD on 06/19/2022       07/07/2022 - 07/07/2022 Endocrine/Hormone Therapy    OP DEGARELIX  Plan Provider: Maurie Boettcher, MD     08/11/2022 Endocrine/Hormone Therapy    OP PROSTATE LEUPROLIDE (ELIGARD) 45 MG EVERY 6 MONTHS  Plan Provider: Maurie Boettcher, MD         Interval history  The patient returns to clinic, unaccompanied. He received his Abiraterone about 2-3 weeks ago and started this along with the prednisone. Energy level and appetite have been good and he feels everything is stable. Reports a constant pain in the right perineum/thigh, that is stable since first noticing it prior to his last appt. No bladder or bowel dysfunction. He is having daily/nightly hot flashes he feels are stable.       Interval hx 09/07/2022    He has been taking his abi/pred for about a month, remembers to take it every day  He is having bowel movements 3-4 times per day. They are solid, without abd pain/cramping or rectal bleeding  + hot flashes  Sleeping good. Appetite good, weight stable  Has been checking BP a few days/week but doesn't remember what the readings are  Gets labs at [facility]      Allergies:  No Known Allergies    Current Medications:    Current Outpatient Medications:     abiraterone (ZYTIGA) 250 mg tablet, Take 4 tablets (1,000 mg total) by mouth daily., Disp: 120 tablet, Rfl: 11    amLODIPine (NORVASC) 5 MG tablet, Take 1 tablet (5 mg total) by mouth daily., Disp: 90 tablet, Rfl: 3    aspirin (ECOTRIN) 81 MG tablet, Take 1 tablet (81 mg total) by mouth daily., Disp: 90 tablet, Rfl: 3    atorvastatin (LIPITOR) 10 MG tablet, Take 1 tablet (10 mg total) by mouth daily., Disp: 90 tablet, Rfl: 3    cyclobenzaprine (FLEXERIL) 10 MG tablet, Take 1 tablet (10 mg total) by mouth nightly as needed for muscle spasms., Disp: 30 tablet, Rfl: 0    docusate sodium (COLACE) 100 MG capsule, Take 1 capsule (100 mg total) by mouth Two (2) times a day., Disp: 60 capsule, Rfl: 11    oxyCODONE (ROXICODONE) 5 MG immediate release tablet, Take 1 tablet (5 mg total) by mouth every four (4) hours as needed for pain., Disp: 30 tablet,  Rfl: 0    polyethylene glycol (MIRALAX) 17 gram packet, Take 17 g by mouth Two (2) times a day., Disp: 100 packet, Rfl: 2    predniSONE (DELTASONE) 5 MG tablet, Take 1 tablet (5 mg total) by mouth daily., Disp: 30 tablet, Rfl: 11    SENNA 8.6 mg tablet, Take 1 tablet by mouth daily., Disp: 30 tablet, Rfl: 5    tamsulosin (FLOMAX) 0.4 mg capsule, Take 1 capsule (0.4 mg total) by mouth daily., Disp: 30 capsule, Rfl: 11    Past Medical History and Social History  Past Medical History:   Diagnosis Date    Anxiety     Arthritis     Basal cell carcinoma     Cancer (CMS-HCC)     skin    Gout     Kidney stones     Squamous cell skin cancer     Stroke (CMS-HCC)       Past Surgical History:   Procedure Laterality Date    PR BIOPSY OF PROSTATE,NEEDLE/PUNCH N/A 05/21/2022    Procedure: IMAGE GUIDED BIOPSY, PROSTATE; NEEDLE OR PUNCH, SINGLE OR MULTIPLE, ANY APPROACH;  Surgeon: Phillips Grout, MD;  Location: [facility];  Service: Urology    PR TRANSURETHRAL ELEC-SURG PROSTATECTOM N/A 05/21/2022    Procedure: TRANSURETHRAL ELECTROSURGICAL RESECT PROSTATE, W/CONTORL POSTOP BLEED, COMPLETE (VASECT, MEATOT, CYSTO ETC);  Surgeon: Phillips Grout, MD;  Location: [facility] OR [facility];  Service: Urology    SKIN BIOPSY      skin cancer removal           Social History     Occupational History    Not on file   Tobacco Use    Smoking status: Every Day    Smokeless tobacco: Never   Substance and Sexual Activity    Alcohol use: No Alcohol/week: 0.0 standard drinks of alcohol     Comment: socially     Drug use: Not Currently     Types: Marijuana    Sexual activity: Not on file       Family History  Family History   Problem Relation Age of Onset    Cancer Father         Skin    Cancer Brother         skin     Substance Abuse Disorder Neg Hx      Prostate Cancer Family History Assessment:  History of cancer in children (yes/no; if yes, what type AND age of diagnosis): No  Total number of siblings (including deceased): 2  History of cancer in siblings (yes/no; if yes, provide relation, type of cancer, AND age of diagnosis): Brother superficial basal cell; age unknown  History of cancer in parents (yes/no; if yes, please specify parent, type of cancer, AND age of diagnosis): Father superficial basal cell carcinoma; age unknown  History of cancer in aunts/uncles/grandparents (yes/no; if yes, provide relation, type of cancer, AND age of diagnosis): No      Review of Systems:  A comprehensive review of 10 systems was negative except for pertinent positives noted in HPI.    Physical Exam:    VITAL SIGNS:  BP 157/81  - Pulse 58  - Temp 36.7 ??C (98 ??F) (Temporal)  - Resp 16  - Ht 167.6 cm (5' 6)  - Wt 68.1 kg (150 lb 3.2 oz)  - SpO2 98%  - BMI 24.24 kg/m??   ECOG Performance Status: 1  GENERAL: Well-developed, well-nourished patient in no acute  distress.  HEAD: Normocephalic and atraumatic.  EYES: Conjunctivae are normal. No scleral icterus.  MOUTH/THROAT: Oropharynx is clear and moist.  No mucosal lesions.  NECK: Supple, no thyromegaly.  LYMPHATICS: No palpable cervical, supraclavicular, or axillary adenopathy.  CARDIOVASCULAR: Normal rate, regular rhythm and normal heart sounds.  Exam reveals no gallop and no friction rub.  No murmur heard.  PULMONARY/CHEST: Effort normal and breath sounds normal. No respiratory distress.  ABDOMINAL:  Soft. There is no distension. There is no tenderness. There is no rebound and no guarding.  MUSCULOSKELETAL: No clubbing, cyanosis, or lower extremity edema.  PSYCHIATRIC: Alert and oriented.  Normal mood and affect.  NEUROLOGIC: No focal motor deficit. Normal gait.  SKIN: Skin is warm, dry, and intact.      Results/Orders:    No visits with results within 2 Week(s) from this visit.   Latest known visit with results is:   Appointment on 08/21/2022   Component Date Value Ref Range Status    PSA 08/21/2022 0.14  0.00 - 4.00 ng/mL Final    Potassium 08/21/2022 4.2  3.5 - 5.1 mmol/L Final    Albumin 08/21/2022 3.9  3.4 - 5.0 g/dL Final    Total Protein 08/21/2022 6.4  5.7 - 8.2 g/dL Final    Total Bilirubin 08/21/2022 0.6  0.3 - 1.2 mg/dL Final    Bilirubin, Direct 08/21/2022 0.20  0.00 - 0.30 mg/dL Final    AST 45/40/9811 20  <=34 U/L Final    ALT 08/21/2022 36  10 - 49 U/L Final    Alkaline Phosphatase 08/21/2022 95  46 - 116 U/L Final       Lab Results   Component Value Date    PSA 0.14 08/21/2022    PSA 0.27 08/07/2022    PSA 1.66 07/24/2022    PSA 13.43 (H) 07/07/2022    PSA 14.24 (H) 06/18/2022    PSA 18.05 (H) 04/29/2022         Orders placed or performed in visit on 09/07/22    Clinic Appointment Request APP       Molecular Pathology  Tumor mutation profiling  N/A  Germline testing  N/A    Pathology:  A: Prostate, right apex, core biopsy  - Prostatic adenocarcinoma, Gleason score 4 + 4 = 8 (Grade group 4) involving 2 of 2 cores, approximately 4 and 1 mm in linear extent, approximately 20% of total core length.      B: Prostate, left apex, core biopsy  - Prostatic adenocarcinoma, Gleason score 4 + 4 = 8 (Grade group 4) involving 2 of 2 cores, approximately 7 and 5 mm in linear extent, approximately 60% of total core length.   - Perineural invasion identified in this case.      C: Prostate, left mid, core biopsy  - Prostatic adenocarcinoma, Gleason score 4 + 5 = 9 (Grade group 5) involving 2 of 2 cores, approximately 6 and 3mm in linear extent, approximately 90% of total core length.      D: Prostate, right mid, core biopsy  - Prostatic adenocarcinoma, Gleason score 4 + 5 = 9 (Grade group 5) involving 2 of 2 cores, approximately 6 and 5 mm in linear extent, approximately 70% of total core length.      E: Prostate, right base, core biopsy  - Benign prostate tissue.      F: Prostate, left base, core biopsy  - Prostatic adenocarcinoma, Gleason score 4 + 4 = 8 (Grade group 4)  involving 2 of  2 cores, approximately 3 and 1 mm in linear extent, approximately 20% of total core length.      G: Bladder neck and bilateral median lobe, transurethral resection  - Involved by prostatic adenocarcinoma with mixed acinar and ductal features, Gleason score 4 + 4 = 8 (Grade group 4)    - Lymphovascular invasion identified     The pattern 4 in this case demonstrates cribriform morphology.      Imaging results:    CT Chest w Contrast 06/11/22:   No intrathoracic metastasis.       CT Abdomen Pelvis W Contrast 06/11/22:     Enlarged irregular prostate correlates with history of biopsy-proven prostate cancer. There is redemonstrated irregularity of the posterior bladder and increased irregularity of the prostate at the bladder margin compared to prior may be secondary to tumor versus postsurgical/post TURP change. Consider attention on follow-up versus further evaluation with PSMA PET scan.       Questionable sclerotic changes of the right inferior pubic ramus are indeterminate and may represent normal variation although metastatic osseous disease is not excluded. Attention on follow-up bone scan. There is no other evidence of metastatic disease in the abdomen/pelvis.       Additional chronic and incidental findings as described.       Please see same day CT chest report for detailed findings above the diaphragm     NM Bone Scan Whole Body 06/11/22  No findings suggestive of osseous metastatic disease     PET CT 07/27/2022:   Impression   1.Ill-defined tracer avidity associated with the prostate, left greater than right, likely representing biopsy-proven prostate carcinoma.   2.Moderate avidity involving the right inferior pubic ramus which is suspicious for single site of osseous metastatic disease. Otherwise, no definite evidence of additional metastatic disease. More specifically, no avid adenopathy. Subcentimeter pulmonary nodules which are below the resolution of PET/CT and require continued attention on follow-up.

## 2022-09-07 NOTE — Unmapped (Signed)
Labs drawn via butterfly & sent for analysis.  To next appt.  Care provided by [facility].

## 2022-09-07 NOTE — Unmapped (Addendum)
Continue zytiga/prednisone  Every 2 weeks: blood work in [place]  In 4 weeks: Labs, visit with [name]  Check your blood pressure at home every day  I want to know if top number is > 160 and/or bottom number > 90; pls bring the record with you  continue excellent self care (no tobacco, minimal alcohol, balanced diet, regular exercise, adequate sleep, follow up with health care providers, etc).  If bowel habits change please reach out to me      Albertina Parr, NP-C, OCN  Adult Nurse Practitioner  Urology and Medical Oncology    Notice: Many test results will be automatically released into MyChart. This may happen before your provider has a chance to review them. Your results will either be reviewed with you at your scheduled appointment or your provider or someone from their office will reach out to you to discuss the results. Thank you for your understanding    If you have been prescribed a medication today, always read the package insert that comes with the medication.    In the event of an emergency, always call 911    Urology:  Main clinic & [place]: [phone]  Fax: [phone]    [facility]:  Phone: [phone]  Fax: [phone]    After hours/nights/weekends:  Hospital Operator: [phone]    [email or web address]  [email or web address]

## 2022-09-09 ENCOUNTER — Ambulatory Visit: Admit: 2022-09-09 | Payer: PRIVATE HEALTH INSURANCE

## 2022-09-09 DIAGNOSIS — C61 Malignant neoplasm of prostate: Principal | ICD-10-CM

## 2022-09-09 MED ORDER — OXYCODONE 5 MG TABLET
ORAL_TABLET | Freq: Four times a day (QID) | ORAL | 0 refills | 8 days | Status: CP | PRN
Start: 2022-09-09 — End: ?

## 2022-09-09 NOTE — Unmapped (Signed)
3Ps covered. Discussed and gave pt information packet regarding radiation - Dr. Tedra Senegal credentials/contact information, general radiation information and prostate radiation information (10 mintues).     Consent witnessed.

## 2022-09-15 DIAGNOSIS — C61 Malignant neoplasm of prostate: Principal | ICD-10-CM

## 2022-09-15 MED ORDER — OXYCODONE 5 MG TABLET
ORAL_TABLET | Freq: Four times a day (QID) | ORAL | 0 refills | 8 days | PRN
Start: 2022-09-15 — End: ?

## 2022-09-15 NOTE — Unmapped (Signed)
Oxycodone refill request sent to Dr. Carles Collet

## 2022-09-16 DIAGNOSIS — C61 Malignant neoplasm of prostate: Principal | ICD-10-CM

## 2022-09-16 MED ORDER — OXYCODONE 5 MG TABLET
ORAL_TABLET | Freq: Four times a day (QID) | ORAL | 0 refills | 8 days | PRN
Start: 2022-09-16 — End: ?

## 2022-09-17 DIAGNOSIS — C61 Malignant neoplasm of prostate: Principal | ICD-10-CM

## 2022-09-17 MED ORDER — OXYCODONE 5 MG TABLET
ORAL_TABLET | Freq: Four times a day (QID) | ORAL | 0 refills | 7 days | Status: CP | PRN
Start: 2022-09-17 — End: ?

## 2022-09-18 NOTE — Unmapped (Signed)
RADIATION ONCOLOGY INITIAL CONSULTATION NOTE    Encounter Date: 09/09/2022  Patient Name: Patrick Montgomery  Medical Record Number: [id number]    Referring Physician: Mcarthur Rossetti, MD  [address] [facility] [place],  [place] [address]  .  Primary Care Provider: Deneise Lever, FNP    ASSESSMENT:  [phone] with high risk prostate cancer (PSA 18, Gleason 4+5 in 10/12 cores) with extension into the bladder neck and a solitary PSMA PET-avid lesion in the R pubic ramus concerning for oligometastatic disease    RECOMMENDATIONS:  We reviewed the natural history of oligometastatic prostate cancer.  We reviewed the data supporting prostate-directed radiation in the setting of oliogmetastatic disease (STAMPEDE, sugroup analysis of HORRAD) and for met-directed radiation (SABR-COMET, ORIOLE) and given his limited disease and otherwise good health it is reasonable to proceed with an aggressive treatment approach.  We discussed the multidisciplinary recommendation for definitive RT targeting the prostate/pelvic LN and the adjacent bone metastasis along with long-term ADT/abiraterone (managed by med onc).  We reviewed the logistics of radiation including daily treatment M-F for 5 1/2 weeks.  Potential side effects of radiation treatment can include urinary symptoms (increased frequency and urgency, dysuria, and obstructive symptoms), GI symptoms (loose stools and blood in stool), and sexual dysfunction. However, urinary incontinence is relatively uncommon after radiation treatment.  In his case we also discussed that he had extension into the bladder and that he is also at risk for necrosis/perforation/fistula involving the bladder that may require surgical intervention.    He has already been on ADT/abi for several weeks and we will move ahead with CT simulation after our visit today to begin the process of radiation planning.  Given the close proximity of the prostate and the lesion in the pubic ramus we can treat  Both lesions simultaneously rather than with separate plans.    INFORMED CONSENT:    We discussed the risks, benefits, side effects, and alternative treatments. The possibility of severe/permenant radiation damage to normal tissues within the radiated area were discussed.  Signed, witnessed consent was obtained.    REASON FOR CONSULTATION:   Patrick Montgomery is a 61 y.o. male who is seen in consultation at the request of Mcarthur Rossetti, * for an opinion regarding the use of radiation therapy in the treatment of his Prostate Cancer.    DIAGNOSIS:  1. Prostate cancer (CMS-HCC)       Cancer Staging   Prostate cancer (CMS-HCC)  Staging form: Prostate, AJCC 8th Edition  - Clinical: Stage IIIC (cT4, cN0, cM0, PSA: 18, Grade Group: 5) - Signed by Maurie Boettcher, MD on 06/19/2022      HISTORY OF PRESENT ILLNESS:  Information pertinent to today's evaluation are the following:    Mr. Reinbold initially presented for evaluation of gross hematuria and severe obstructive urinary symptoms    04/21/2022- CT A/P shows enlarged prostate with enhancement extending into the bladder at the bladder neck    04/29/2022- PSA 18.05    05/14/2022- urine cytology negative for malignancy    05/21/2022- TRUS/prostate biopsy (Prostate measures 50cc) and cystoscopy (Dr. Laural Benes)- surgical findings included prostate with tumor extending into the L bladder neck.  Resection of tumor in the bladder neck and prostateic urethra were resected.  There was likely prostatic tumor protruding through the resection bed.  Bleeding was difficult to control  A: Prostate, right apex, core biopsy  - Prostatic adenocarcinoma, Gleason score 4 + 4 = 8 (Grade group  4) involving 2 of 2 cores, approximately 4 and 1 mm in linear extent, approximately 20% of total core length.      B: Prostate, left apex, core biopsy  - Prostatic adenocarcinoma, Gleason score 4 + 4 = 8 (Grade group 4) involving 2 of 2 cores, approximately 7 and 5 mm in linear extent, approximately 60% of total core length.   - Perineural invasion identified in this case.      C: Prostate, left mid, core biopsy  - Prostatic adenocarcinoma, Gleason score 4 + 5 = 9 (Grade group 5) involving 2 of 2 cores, approximately 6 and 3mm in linear extent, approximately 90% of total core length.      D: Prostate, right mid, core biopsy  - Prostatic adenocarcinoma, Gleason score 4 + 5 = 9 (Grade group 5) involving 2 of 2 cores, approximately 6 and 5 mm in linear extent, approximately 70% of total core length.      E: Prostate, right base, core biopsy  - Benign prostate tissue.      F: Prostate, left base, core biopsy  - Prostatic adenocarcinoma, Gleason score 4 + 4 = 8 (Grade group 4)  involving 2 of 2 cores, approximately 3 and 1 mm in linear extent, approximately 20% of total core length.      G: Bladder neck and bilateral median lobe, transurethral resection  - Involved by prostatic adenocarcinoma with mixed acinar and ductal features, Gleason score 4 + 4 = 8 (Grade group 4)    - Lymphovascular invasion identified     The pattern 4 in this case demonstrates cribriform morphology.    06/11/2022- CT C/A/P shows a sclerotic lesion in the R inferior pubic ramus that is indeterminate.  No other evidence of metastatic disease    05/28/2022- NM Bone scan shows no evidence of metastatic disease    07/07/2022- PSA 13.43    07/07/2022- Initiates ADT with degarelix (to be followed by Eligard) with abiraterone (Dr. Philomena Course)    07/27/2022- PSMA PET  1.Ill-defined tracer avidity associated with the prostate, left greater than right, likely representing biopsy-proven prostate carcinoma.  2.Moderate avidity involving the right inferior pubic ramus which is suspicious for single site of osseous metastatic disease. Otherwise, no definite evidence of additional metastatic disease. More specifically, no avid adenopathy. Subcentimeter pulmonary nodules which are below the resolution of PET/CT and require continued attention on follow-up.    08/07/2022- PSA 0.27    08/21/2022- PSA 0.14    09/07/2022- PSA 0.09    I have reviewed old/outside medical records (please see above for summary).    REVIEW OF SYSTEMS:    A comprehensive review of 10 systems was negative except for pertinent positives noted in HPI.    Past Medical History:   Diagnosis Date   ??? Anxiety    ??? Arthritis    ??? Basal cell carcinoma    ??? Cancer (CMS-HCC)     skin   ??? Gout    ??? Kidney stones    ??? Squamous cell skin cancer    ??? Stroke (CMS-HCC)       Prior Radiation Therapy:no  Pacemaker: no  Pregnancy status: No; male patient  Collagen Vascular Disease:no    Past Surgical History:   Procedure Laterality Date   ??? PR BIOPSY OF PROSTATE,NEEDLE/PUNCH N/A 05/21/2022    Procedure: IMAGE GUIDED BIOPSY, PROSTATE; NEEDLE OR PUNCH, SINGLE OR MULTIPLE, ANY APPROACH;  Surgeon: Phillips Grout, MD;  [facility];  Service: Urology   ???  PR TRANSURETHRAL ELEC-SURG PROSTATECTOM N/A 05/21/2022    Procedure: TRANSURETHRAL ELECTROSURGICAL RESECT PROSTATE, W/CONTORL POSTOP BLEED, COMPLETE (VASECT, MEATOT, CYSTO ETC);  Surgeon: Phillips Grout, MD;  Location: [facility];  Service: Urology   ??? SKIN BIOPSY     ??? skin cancer removal           Family History   Problem Relation Age of Onset   ??? Cancer Father         Skin   ??? Cancer Brother         skin    ??? Substance Abuse Disorder Neg Hx         Social History     Occupational History   ??? Not on file   Tobacco Use   ??? Smoking status: Every Day     Packs/day: 1.50     Years: 45.00     Additional pack years: 0.00     Total pack years: 67.50     Types: Cigarettes   ??? Smokeless tobacco: Never   Vaping Use   ??? Vaping Use: Never used   Substance and Sexual Activity   ??? Alcohol use: No     Alcohol/week: 0.0 standard drinks of alcohol     Comment: socially    ??? Drug use: Not Currently     Types: Marijuana   ??? Sexual activity: Not Currently       ALLERGIES/MEDICATIONS:  Reviewed in EPIC    PHYSICAL EXAM:     Vital Signs for this encounter:  BSA: 1.77 meters squared  BP 146/80  - Pulse 65  - Temp 36.7 ??C (98 ??F) (Temporal)  - Resp 17  - Wt 67.4 kg (148 lb 11.2 oz)  - SpO2 97%  - BMI 24.00 kg/m??   Karnofsky/Lansky Performance Status: 80, Normal activity with effort; some signs or symptoms of disease (ECOG equivalent 1)  General:   No acute distress, alert and oriented X 4   Psychiatric:  Normal mood and affect.  Converses clearly and emotionally appropriate.   HEENT:  EOMI, MMM  Cardio: RRR, no m/r/g  Respiratory: LCTAB, no w/r/r  Abdomen: Soft, non-tender, non-distended. Normal active bowel sounds  Neuro: CN III-XII intact grossly, AAOx3  Extremities:  No clubbing cyanosis, or edema      RADIOLOGY:  Imaging was personally reviewed as detailed in the history (see above).     PATHOLOGY:   Pathology was personally reviewed as detailed in the HPI.       Labs:  Lab Results   Component Value Date    AST 20 09/07/2022    ALT 25 09/07/2022         Electronically signed by:  Rayetta Humphrey, MD  Assistant Professor  [facility] Dept of Radiation Oncology  09/18/2022

## 2022-09-21 ENCOUNTER — Ambulatory Visit: Admit: 2022-09-21 | Discharge: 2022-09-22 | Payer: PRIVATE HEALTH INSURANCE

## 2022-09-21 LAB — HEPATIC FUNCTION PANEL
ALBUMIN: 3.9 g/dL (ref 3.4–5.0)
ALKALINE PHOSPHATASE: 86 U/L (ref 46–116)
ALT (SGPT): 30 U/L (ref 10–49)
AST (SGOT): 19 U/L (ref <=34)
BILIRUBIN DIRECT: 0.1 mg/dL (ref 0.00–0.30)
BILIRUBIN TOTAL: 0.3 mg/dL (ref 0.3–1.2)
PROTEIN TOTAL: 6.6 g/dL (ref 5.7–8.2)

## 2022-09-21 LAB — POTASSIUM: POTASSIUM: 4.2 mmol/L (ref 3.5–5.1)

## 2022-09-21 LAB — PSA: PROSTATE SPECIFIC ANTIGEN: 0.04 ng/mL (ref 0.00–4.00)

## 2022-09-23 NOTE — Unmapped (Signed)
[facility] Refill Coordination Note    Patrick Montgomery, Patrick Montgomery: [birth year]  Phone: [phone] (home)       All above HIPAA information was verified with patient.         09/22/2022     1:31 PM   Specialty Rx Medication Refill Questionnaire   Which Medications would you like refilled and shipped? Amlodipine (out) abiraterone (two weeks) Prednisone (two weeks)   Please list all current allergies: None   Have you missed any doses in the last 30 days? No   Have you had any changes to your medication(s) since your last refill? No   How many days remaining of each medication do you have at home? About two weeks   Have you experienced any side effects in the last 30 days? No   Please enter the full address (street address, city, state, zip code) where you would like your medication(s) to be delivered to. [address] [place] [address]   Please specify on which day you would like your medication(s) to arrive. Note: if you need your medication(s) within 3 days, please call the pharmacy to schedule your order at [phone]  09/25/2022   Has your insurance changed since your last refill? No   Would you like a pharmacist to call you to discuss your medication(s)? No   Do you require a signature for your package? (Note: if we are billing Medicare Part B or your order contains a controlled substance, we will require a signature) No         Completed refill call assessment today to schedule patient's medication shipment from the [facility] [phone]).  All relevant notes have been reviewed.       Confirmed patient received a Conservation officer, historic buildings and a Surveyor, mining with first shipment. The patient will receive a drug information handout for each medication shipped and additional FDA Medication Guides as required.         REFERRAL TO PHARMACIST     Referral to the pharmacist: Not needed      [facility]     Shipping address confirmed in Epic.     Delivery Scheduled: Yes, Expected medication delivery date: 10/01/22.     Medication will be delivered via Next Day Courier to the prescription address in Epic [place].    [name] M Elisabeth Cara   [facility] Specialty Technician

## 2022-09-28 DIAGNOSIS — C61 Malignant neoplasm of prostate: Principal | ICD-10-CM

## 2022-09-28 MED ORDER — OXYCODONE 5 MG TABLET
ORAL_TABLET | Freq: Four times a day (QID) | ORAL | 0 refills | 7 days | Status: CP | PRN
Start: 2022-09-28 — End: ?

## 2022-09-30 ENCOUNTER — Encounter: Admit: 2022-09-30 | Payer: PRIVATE HEALTH INSURANCE | Attending: Radiation Oncology | Primary: Radiation Oncology

## 2022-09-30 ENCOUNTER — Encounter
Admit: 2022-09-30 | Discharge: 2022-10-29 | Payer: PRIVATE HEALTH INSURANCE | Attending: Radiation Oncology | Primary: Radiation Oncology

## 2022-09-30 ENCOUNTER — Ambulatory Visit: Admit: 2022-09-30 | Payer: PRIVATE HEALTH INSURANCE

## 2022-09-30 MED ORDER — ONDANSETRON 4 MG DISINTEGRATING TABLET
ORAL_TABLET | Freq: Three times a day (TID) | ORAL | 1 refills | 10 days | Status: CP | PRN
Start: 2022-09-30 — End: 2022-10-20

## 2022-09-30 MED FILL — PREDNISONE 5 MG TABLET: ORAL | 30 days supply | Qty: 30 | Fill #3

## 2022-09-30 MED FILL — ABIRATERONE 250 MG TABLET: ORAL | 30 days supply | Qty: 120 | Fill #3

## 2022-09-30 NOTE — Unmapped (Signed)
SiteLast TxDose  ON:[id number]: 09/30/2022: 750/7,000 cGy    No diarrhea. Occasional urgency, abdominal cramping, pain and bleeding. No mucus past. Occasional urgency but unable to have a BM.    Urinary flow fairly easy. Urinates x2-3 nightly and x5-8 daily. No pain or burning. Occasional urgency. No leakage.

## 2022-09-30 NOTE — Unmapped (Signed)
RADIATION TREATMENT MANAGEMENT NOTE     Encounter Date: 09/30/2022  Patient Name: Patrick Montgomery  Medical Record Number: [id number]    DIAGNOSIS:  61yo with high risk prostate cancer (PSA 18, Gleason 4+5 in 10/12 cores) with extension into the bladder neck and a solitary PSMA PET-avid lesion in the R pubic ramus concerning for oligometastatic disease     ASSESSMENT: 750cGy of planned 7000cGy  Karnofsky/Lansky Performance Status: 80, Normal activity with effort; some signs or symptoms of disease (ECOG equivalent 1)  Chemotherapy/Systemic therapy: ADT, abiraterone  Clinical Trial:   no    RECOMMENDATIONS:  1. Plan for Therapy: Continue treatment as planned  2. GU:  Continue flomax once daily  3. GI:  No change in bowel habits  4. Mild nausea- unclear if this is from RT or unrelated cause- prn zofran prescribed today.  5. Pain:  Likely related to the metastasis in the R pubic ramus- hopefully this will improve as we proceed through RT.  PRN oxycodone until that time (refill provided earlier this week)  6. Hot flashes:  Very bothersome- he'll discuss with med onc  7. Follow-up:  For status check next week    SUBJECTIVE: Doing OK today.  Pain is same as before and is managed with prn oxycodone.  He has had a little nausea and had an episode of emesis yesterday.  Feels fine today.  Otherwise no GI issues.  Urination is at baseline- stream is OK, nocturia ~2x.    Baseline Symptoms:  General: Good baseline energy, was working prior to diagnosis. Lives in [place].   Urinary: Severe obstructive symptoms, hematuria at least intermittently, has constipation at baseline  GI: Denies loose stools, rectal pain, or bleeding.   Colonoscopy: Patient states he is current on screening with PCP, lab order for cologuard visibie in care everywhere but results not visible.   Inflammatory bowel disease: Denies     PHYSICAL EXAM:  Vital Signs for this encounter:  Wt 66.4 kg (146 lb 4.8 oz)  - BMI 23.61 kg/m??   Last weight:    Wt Readings from Last 4 Encounters:   09/30/22 66.4 kg (146 lb 4.8 oz)   09/09/22 67.4 kg (148 lb 11.2 oz)   09/07/22 68.1 kg (150 lb 3.2 oz)   08/11/22 65.7 kg (144 lb 12.8 oz)     General:  Alert and Orientated X 3.  No acute distress.    Skin: Not examined    I have reviewed the patient's dose delivery, dosimetry, lab tests, patient treatment set-up, port films, treatment parameters and x-rays.    Rayetta Humphrey, MD  Assistant Professor  [facility] Dept of Radiation Oncology  09/30/2022

## 2022-10-05 ENCOUNTER — Ambulatory Visit: Admit: 2022-10-05 | Discharge: 2022-10-06 | Payer: PRIVATE HEALTH INSURANCE

## 2022-10-05 ENCOUNTER — Ambulatory Visit
Admit: 2022-10-05 | Discharge: 2022-10-06 | Payer: PRIVATE HEALTH INSURANCE | Attending: Nurse Practitioner | Primary: Nurse Practitioner

## 2022-10-05 LAB — HEPATIC FUNCTION PANEL
ALBUMIN: 3.8 g/dL (ref 3.4–5.0)
ALKALINE PHOSPHATASE: 78 U/L (ref 46–116)
ALT (SGPT): 41 U/L (ref 10–49)
AST (SGOT): 28 U/L (ref <=34)
BILIRUBIN DIRECT: 0.2 mg/dL (ref 0.00–0.30)
BILIRUBIN TOTAL: 0.5 mg/dL (ref 0.3–1.2)
PROTEIN TOTAL: 6.5 g/dL (ref 5.7–8.2)

## 2022-10-05 LAB — POTASSIUM: POTASSIUM: 3.8 mmol/L (ref 3.5–5.1)

## 2022-10-05 LAB — PSA: PROSTATE SPECIFIC ANTIGEN: 0.09 ng/mL (ref 0.00–4.00)

## 2022-10-06 DIAGNOSIS — C61 Malignant neoplasm of prostate: Principal | ICD-10-CM

## 2022-10-06 MED ORDER — OXYCODONE 5 MG TABLET
ORAL_TABLET | Freq: Four times a day (QID) | ORAL | 0 refills | 7 days | Status: CP | PRN
Start: 2022-10-06 — End: ?

## 2022-10-07 MED ORDER — MORPHINE ER 15 MG TABLET,EXTENDED RELEASE
ORAL_TABLET | Freq: Two times a day (BID) | ORAL | 0 refills | 15 days | Status: CP
Start: 2022-10-07 — End: ?

## 2022-10-07 NOTE — Unmapped (Signed)
SiteLast TxDose  GN:[id number]: 10/07/2022: 2,000/7,000 cGy    Denies any diarrhea. Does report occasional pain/bleeding with BM's, but not often.     Pain at R hip area, 6/10 today. Patient is taking home oxycodone for it, which helps.     Urination: No burning/pain. More frequent than normal. Steady stream.     Appetite: Reports good appetite. Denies any nausea today, but reports mild episode yesterday.     Hot Flashes: Reports as 'miserable'. Patient states he wakes up during the night soak and wet from sweats.

## 2022-10-07 NOTE — Unmapped (Signed)
RADIATION TREATMENT MANAGEMENT NOTE     Encounter Date: 10/07/2022  Patient Name: Patrick Montgomery  Medical Record Number: [id number]    DIAGNOSIS:  61yo with high risk prostate cancer (PSA 18, Gleason 4+5 in 10/12 cores) with extension into the bladder neck and a solitary PSMA PET-avid lesion in the R pubic ramus concerning for oligometastatic disease     ASSESSMENT: 2000cGy of planned 7000cGy  Karnofsky/Lansky Performance Status: 80, Normal activity with effort; some signs or symptoms of disease (ECOG equivalent 1)  Chemotherapy/Systemic therapy: ADT, abiraterone  Clinical Trial:   no    RECOMMENDATIONS:  Plan for Therapy: Continue treatment as planned  GU:  Continue flomax once daily  GI:  No change in bowel habits- discussed stool softener and prn laxative given pain med usage  Mild nausea- unclear if this is from RT or unrelated cause- prn zofran prescribed today.  Pain:  Likely related to the metastasis in the R pubic ramus- hopefully this will improve as we proceed through RT.  Will start MS contin and has prn oxycodone as well  Hot flashes:  Very bothersome- he'll discuss with med onc  Follow-up:  For status check next week    SUBJECTIVE: Doing alright today without any major changes from last week.  Pain remains his biggest issue- managed with 10mg  oxycodone ~3 times per day (estimates he takes 5-6 5mg  tablets daily).  Otherwise urination is similar to last week- flow is good on flomax.  Nocturia 2-3x, rare urgency.  No GI issues- specifically no constipation, diarrhea.    Baseline Symptoms:  General: Good baseline energy, was working prior to diagnosis. Lives in [place].   Urinary: Severe obstructive symptoms, hematuria at least intermittently, has constipation at baseline  GI: Denies loose stools, rectal pain, or bleeding.   Colonoscopy: Patient states he is current on screening with PCP, lab order for cologuard visibie in care everywhere but results not visible.   Inflammatory bowel disease: Denies PHYSICAL EXAM:  Vital Signs for this encounter:  Wt 68.4 kg (150 lb 12.8 oz)  - BMI 24.34 kg/m??   Last weight:    Wt Readings from Last 4 Encounters:   10/07/22 68.4 kg (150 lb 12.8 oz)   09/30/22 66.4 kg (146 lb 4.8 oz)   09/09/22 67.4 kg (148 lb 11.2 oz)   09/07/22 68.1 kg (150 lb 3.2 oz)     General:  Alert and Orientated X 3.  No acute distress.    Skin: Not examined    I have reviewed the patient's dose delivery, dosimetry, lab tests, patient treatment set-up, port films, treatment parameters and x-rays.    Rayetta Humphrey, MD  Assistant Professor  [facility] Dept of Radiation Oncology  10/07/2022

## 2022-10-14 DIAGNOSIS — C61 Malignant neoplasm of prostate: Principal | ICD-10-CM

## 2022-10-14 MED ORDER — OXYCODONE 5 MG TABLET
ORAL_TABLET | Freq: Four times a day (QID) | ORAL | 0 refills | 8 days | Status: CP | PRN
Start: 2022-10-14 — End: ?

## 2022-10-14 MED ORDER — ONDANSETRON 4 MG DISINTEGRATING TABLET
ORAL_TABLET | Freq: Three times a day (TID) | ORAL | 1 refills | 10 days | Status: CP | PRN
Start: 2022-10-14 — End: 2022-11-03

## 2022-10-14 NOTE — Unmapped (Signed)
SiteLast TxDose  ZO:[id number]: 10/14/2022: 3,250/7,000 cGy    No diarrhea. Fairly frequent urgency to move bowels - feels like I have to poop all the time. Fairly frequent abdominal tenderness and pain. Occasional bleeding. Fairly frequent abdominal cramping. No mucus past. Fairly frequent urgency to have BM but unable to.    Urinary flow fairly easy. Urinates x2-3 nightly and x9-12 daily. Occasional pain and burning. Fairly frequent urgency. No leakage. Pt reports he is out of the Flomax.    Pt continues to have issues with nausea - taking Zofran as needed and helps with the nausea. Pt requested a refill.

## 2022-10-14 NOTE — Unmapped (Signed)
RADIATION TREATMENT MANAGEMENT NOTE     Encounter Date: 10/14/2022  Patient Name: Patrick Montgomery  Medical Record Number: [id number]    DIAGNOSIS:  61yo with high risk prostate cancer (PSA 18, Gleason 4+5 in 10/12 cores) with extension into the bladder neck and a solitary PSMA PET-avid lesion in the R pubic ramus concerning for oligometastatic disease     ASSESSMENT: 3250cGy of planned 7000cGy  Karnofsky/Lansky Performance Status: 80, Normal activity with effort; some signs or symptoms of disease (ECOG equivalent 1)  Chemotherapy/Systemic therapy: ADT, abiraterone  Clinical Trial:   no    RECOMMENDATIONS:  1. Plan for Therapy: Continue treatment as planned  2. GU:  He has stopped flomax- encouraged him to restart this given his urinary issues  3. GI:  More issues with constipation this week- reminded him to take stool softener and prn laxative given pain med usage  4. Mild nausea- unclear if this is from RT or unrelated cause- prn zofran prescribed today.  5. Pain:  Likely related to the metastasis in the R pubic ramus- hopefully this will improve as we proceed through RT.  Continue MS contin 15mg  BID and oyxcodone prn for breakthrough pain- it is unclear to me if he is taking MS contin was prescribed- he will bring in his medications tomorrow to review.  6. Hot flashes:  Very bothersome- he'll discuss with med onc  7. Follow-up:  For status check next week    SUBJECTIVE: Doing OK today.  Pain is still his biggest issue- last week we started MS contin but he's unclear if he is taking this twice daily but notes he has a lot of medications now and may be confusing his meds.  He is still taking 5-6 oxycodone tablets daily and pain is managed with this regimen.  Urination flow is a little weaker, Nocturia 2-3x, rare urgency.  Thinks he is not taking flomax anymore.  He is having small frequent hard bowel movements although on occasion is having larger bowel movements.  Not taking stool softener or prn laxative.    Baseline Symptoms:  General: Good baseline energy, was working prior to diagnosis. Lives in [place].   Urinary: Severe obstructive symptoms, hematuria at least intermittently, has constipation at baseline  GI: Denies loose stools, rectal pain, or bleeding.   Colonoscopy: Patient states he is current on screening with PCP, lab order for cologuard visibie in care everywhere but results not visible.   Inflammatory bowel disease: Denies     PHYSICAL EXAM:  Vital Signs for this encounter:  There were no vitals taken for this visit.  Last weight:    Wt Readings from Last 4 Encounters:   10/07/22 68.4 kg (150 lb 12.8 oz)   09/30/22 66.4 kg (146 lb 4.8 oz)   09/09/22 67.4 kg (148 lb 11.2 oz)   09/07/22 68.1 kg (150 lb 3.2 oz)     General:  Alert and Orientated X 3.  No acute distress.    Skin: Not examined    I have reviewed the patient's dose delivery, dosimetry, lab tests, patient treatment set-up, port films, treatment parameters and x-rays.    Rayetta Humphrey, MD  Assistant Professor  [facility] Dept of Radiation Oncology  10/14/2022

## 2022-10-14 NOTE — Unmapped (Addendum)
For your pain control you have 2 medications:  MS Contin and oxycodone.  Please take them as follows    MS Contin:  Take twice daily as scheduled (Morning dose and evening dose) with 12 hours in between    Oxycodone:  Take 1 tablet every 4-6 hours as needed if pain is not controlled with MS contin    For your bowels:  Please start taking a stool softener such as colace at least once daily.  You can also take a laxative such as miralax if you are having more issues with constipation

## 2022-10-16 ENCOUNTER — Ambulatory Visit: Admit: 2022-10-16 | Discharge: 2022-10-17 | Payer: PRIVATE HEALTH INSURANCE

## 2022-10-16 LAB — POTASSIUM: POTASSIUM: 4 mmol/L (ref 3.5–5.1)

## 2022-10-16 LAB — HEPATIC FUNCTION PANEL
ALBUMIN: 3.5 g/dL (ref 3.4–5.0)
ALKALINE PHOSPHATASE: 68 U/L (ref 46–116)
ALT (SGPT): 43 U/L (ref 10–49)
AST (SGOT): 33 U/L (ref <=34)
BILIRUBIN DIRECT: 0.2 mg/dL (ref 0.00–0.30)
BILIRUBIN TOTAL: 0.5 mg/dL (ref 0.3–1.2)
PROTEIN TOTAL: 6 g/dL (ref 5.7–8.2)

## 2022-10-16 LAB — PSA: PROSTATE SPECIFIC ANTIGEN: <0.04 ng/mL (ref 0.00–4.00)

## 2022-10-21 DIAGNOSIS — C61 Malignant neoplasm of prostate: Principal | ICD-10-CM

## 2022-10-21 MED ORDER — OXYCODONE 5 MG TABLET
ORAL_TABLET | Freq: Four times a day (QID) | ORAL | 0 refills | 8 days | Status: CP | PRN
Start: 2022-10-21 — End: ?

## 2022-10-21 MED ORDER — MORPHINE ER 15 MG TABLET,EXTENDED RELEASE
ORAL_TABLET | Freq: Two times a day (BID) | ORAL | 0 refills | 15 days | Status: CP
Start: 2022-10-21 — End: ?

## 2022-10-21 NOTE — Unmapped (Signed)
SiteLast TxDose  VW:[id number]: 10/21/2022: 4,500/7,000 cGy    No diarrhea. Fairly frequent urgency to move bowels -  still feeling like I have to poop all the time. Fairly frequent abdominal tenderness and pain. Occasional bleeding - every once in awhile, about the same as last week. Fairly frequent abdominal cramping. No mucus past. Fairly frequent urgency to have BM but unable to.    Urinary flow fairly easy. Urinates more than x3 nightly and x5-8 daily. Fairly frequent pain and burning. Fairly frequent urgency. No leakage. Pt still out of the Flomax - going to pick-up from pharmacy today.    Nausea about the same - taking Zofran as needed and helps with the nausea.

## 2022-10-21 NOTE — Unmapped (Signed)
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 Plan for Therapy: Continue treatment as planned  2. GU:  He has stopped flomax- he has not restarted yet but will plan to pick up today  3. GI:  More issues with constipation this week- reminded him to take stool softener and prn laxative given pain med usage  4. Mild nausea- unclear if this is from RT or unrelated cause- prn zofran prescribed previously  5. Pain:  Likely related to the metastasis in the R pubic ramus- hopefully this will improve as we proceed through RT.  Continue MS contin 15mg  BID and oyxcodone prn for breakthrough pain- refill provided today  6. Hot flashes:  Very bothersome- he'll discuss with med onc  7. Follow-up:  For status check next week    SUBJECTIVE: Doing OK today without any major changes from last week except for pain a little worse as he has run out of his pain medication.  Pain is a 7/10 today and overall unchanged from prior- MS contin/oxycodone was helpipng manage the pain.  Urination is OK- flow is alright but having more issues with urgency, dysuria, nocturia (3-4x).  He did not restart flomax like we discussed last week.  Bowels are OK- some frequency but not loose.    Baseline Symptoms:  General: Good baseline energy, was working prior to diagnosis. Lives in [place].   Urinary: Severe obstructive symptoms, hematuria at least intermittently, has constipation at baseline  GI: Denies loose stools, rectal pain, or bleeding.   Colonoscopy: Patient states he is current on screening with PCP, lab order for cologuard visibie in care everywhere but results not visible.   Inflammatory bowel disease: Denies     PHYSICAL EXAM:  Vital Signs for this encounter:  Wt 67.9 kg (149 lb 9.6 oz)  - BMI 24.15 kg/m??   Last weight:    Wt Readings from Last 4 Encounters:   10/21/22 67.9 kg (149 lb 9.6 oz)   10/14/22 67.8 kg (149 lb 8 oz)   10/07/22 68.4 kg (150 lb 12.8 oz)   09/30/22 66.4 kg (146 lb 4.8 oz)     General:  Alert and Orientated X 3.  No acute distress.    Skin: Not examined    I have reviewed the patient's dose delivery, dosimetry, lab tests, patient treatment set-up, port films, treatment parameters and x-rays.    Rayetta Humphrey, MD  Assistant Professor  [facility] Dept of Radiation Oncology  10/21/2022

## 2022-10-28 DIAGNOSIS — C61 Malignant neoplasm of prostate: Principal | ICD-10-CM

## 2022-10-28 DIAGNOSIS — K5900 Constipation, unspecified: Principal | ICD-10-CM

## 2022-10-28 MED ORDER — DOCUSATE SODIUM 100 MG CAPSULE
ORAL_CAPSULE | Freq: Two times a day (BID) | ORAL | 11 refills | 30 days | Status: CP
Start: 2022-10-28 — End: ?

## 2022-10-28 MED ORDER — OXYBUTYNIN CHLORIDE 5 MG TABLET
ORAL_TABLET | Freq: Three times a day (TID) | ORAL | 11 refills | 30 days | Status: CP
Start: 2022-10-28 — End: 2023-10-28

## 2022-10-28 MED ORDER — OXYCODONE 5 MG TABLET
ORAL_TABLET | Freq: Four times a day (QID) | ORAL | 0 refills | 8 days | Status: CP | PRN
Start: 2022-10-28 — End: ?

## 2022-10-28 NOTE — Unmapped (Signed)
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 Plan for Therapy: Continue treatment as planned  2. GU:  Restarted flomax with improvement in flow.  Having more urgency- will do trial of ditropan  3. GI:  Continues to have issues with constipation- reminded him to take stool softener and prn laxative given pain med usage again  4. Mild nausea- unclear if this is from RT or unrelated cause (I.e. constipation)- prn zofran prescribed previously  5. Pain:  Likely related to the metastasis in the R pubic ramus- hopefully this will improve as we proceed through RT.  Continue MS contin 15mg  BID and oyxcodone prn for breakthrough pain- refill for oxycodone provided today  6. Hot flashes:  Very bothersome- he'll discuss with med onc  7. Follow-up:  For status check next week    SUBJECTIVE:  Feeling well today overall without any major changes from before except for increase in nausea.  He had a lot of issues with emesis on last Friday but no issues since then.  He does feel like he's constipated- having bowel movements daily but they are hard.  He has not started a stool softener and has not taken miralax since our discussion last week.  Zofran does help.  Urination is fairly stable- restarted flomax which helped with stream strength and frequency.  Still having lots of urgency- interested in trial of ditropan.  Still having pain in the area of his sit bone- taking MS contin and oxycodone for this.  Pain unchanged from last week.    Baseline Symptoms:  General: Good baseline energy, was working prior to diagnosis. Lives in [place].   Urinary: Severe obstructive symptoms, hematuria at least intermittently, has constipation at baseline  GI: Denies loose stools, rectal pain, or bleeding.   Colonoscopy: Patient states he is current on screening with PCP, lab order for cologuard visibie in care everywhere but results not visible.   Inflammatory bowel disease: Denies     PHYSICAL EXAM:  Vital Signs for this encounter:  There were no vitals taken for this visit.  Last weight:    Wt Readings from Last 4 Encounters:   10/21/22 67.9 kg (149 lb 9.6 oz)   10/14/22 67.8 kg (149 lb 8 oz)   10/07/22 68.4 kg (150 lb 12.8 oz)   09/30/22 66.4 kg (146 lb 4.8 oz)     General:  Alert and Orientated X 3.  No acute distress.    Skin: Not examined    I have reviewed the patient's dose delivery, dosimetry, lab tests, patient treatment set-up, port films, treatment parameters and x-rays.    Rayetta Humphrey, MD  Assistant Professor  [facility] Dept of Radiation Oncology  10/28/2022

## 2022-10-28 NOTE — Unmapped (Signed)
SiteLast TxDose  ZO:XWRUEAVWU: 10/28/2022: 5,250/7,000 cGy    No diarrhea. Frequent urgency to move bowels. Fairly frequent abdominal tenderness and pain. Occasional bleeding - every once in awhile, no worse. Fairly frequent abdominal cramping. No mucus past. Fairly frequent urgency to have BM but unable to.    Urinary flow fairly easy. Urinates more than x3 nightly and x9-12 daily. Fairly frequent pain and burning. Fairly frequent urgency. Occasional leakage. Pt is taking Flomax now.    Pt was nauseated all day Friday, but it's about the same - taking Zofran as needed and is helping with the nausea.

## 2022-10-30 ENCOUNTER — Ambulatory Visit: Admit: 2022-10-30 | Payer: PRIVATE HEALTH INSURANCE

## 2022-10-30 ENCOUNTER — Encounter: Admit: 2022-10-30 | Payer: PRIVATE HEALTH INSURANCE | Attending: Radiation Oncology | Primary: Radiation Oncology

## 2022-10-30 ENCOUNTER — Ambulatory Visit
Admit: 2022-10-30 | Discharge: 2022-11-29 | Payer: PRIVATE HEALTH INSURANCE | Attending: Radiation Oncology | Primary: Radiation Oncology

## 2022-10-30 LAB — HEPATIC FUNCTION PANEL
ALBUMIN: 3.4 g/dL (ref 3.4–5.0)
ALKALINE PHOSPHATASE: 73 U/L (ref 46–116)
ALT (SGPT): 17 U/L (ref 10–49)
AST (SGOT): 15 U/L (ref <=34)
BILIRUBIN DIRECT: 0.2 mg/dL (ref 0.00–0.30)
BILIRUBIN TOTAL: 0.4 mg/dL (ref 0.3–1.2)
PROTEIN TOTAL: 5.9 g/dL (ref 5.7–8.2)

## 2022-10-30 LAB — POTASSIUM: POTASSIUM: 4 mmol/L (ref 3.5–5.1)

## 2022-10-30 LAB — PSA: PROSTATE SPECIFIC ANTIGEN: <0.04 ng/mL (ref 0.00–4.00)

## 2022-11-03 DIAGNOSIS — I63231 Cerebral infarction due to unspecified occlusion or stenosis of right carotid arteries: Principal | ICD-10-CM

## 2022-11-03 DIAGNOSIS — E785 Hyperlipidemia, unspecified: Principal | ICD-10-CM

## 2022-11-03 DIAGNOSIS — C61 Malignant neoplasm of prostate: Principal | ICD-10-CM

## 2022-11-03 DIAGNOSIS — Z8679 Personal history of other diseases of the circulatory system: Principal | ICD-10-CM

## 2022-11-03 MED ORDER — ATORVASTATIN 10 MG TABLET
ORAL_TABLET | Freq: Every day | ORAL | 3 refills | 90.00000 days | Status: CP
Start: 2022-11-03 — End: 2022-11-03

## 2022-11-03 MED ORDER — AMLODIPINE 5 MG TABLET
ORAL_TABLET | Freq: Every day | ORAL | 3 refills | 90.00000 days | Status: CP
Start: 2022-11-03 — End: 2022-11-03
  Filled 2022-12-18: qty 90, 90d supply, fill #0

## 2022-11-03 MED ORDER — ONDANSETRON 4 MG DISINTEGRATING TABLET
ORAL_TABLET | Freq: Three times a day (TID) | ORAL | 1 refills | 10 days | Status: CP | PRN
Start: 2022-11-03 — End: 2022-11-23
  Filled 2022-11-05: qty 30, 10d supply, fill #0

## 2022-11-04 DIAGNOSIS — C61 Malignant neoplasm of prostate: Principal | ICD-10-CM

## 2022-11-04 MED ORDER — PREDNISONE 5 MG TABLET
ORAL_TABLET | Freq: Every day | ORAL | 0 refills | 14 days | Status: CP
Start: 2022-11-04 — End: ?
  Filled 2022-11-16: qty 30, 30d supply, fill #4

## 2022-11-04 MED ORDER — OXYCODONE 5 MG TABLET
ORAL_TABLET | Freq: Four times a day (QID) | ORAL | 0 refills | 8 days | Status: CP | PRN
Start: 2022-11-04 — End: ?

## 2022-11-04 MED ORDER — MORPHINE ER 15 MG TABLET,EXTENDED RELEASE
ORAL_TABLET | Freq: Two times a day (BID) | ORAL | 0 refills | 15 days | Status: CP
Start: 2022-11-04 — End: ?

## 2022-11-04 NOTE — Unmapped (Signed)
SiteLast TxDose  GN:FAOZHYQMV: 11/04/2022: 6,500/7,000 cGy    No diarrhea. Frequent urgency to move bowels. Fairly frequent abdominal tenderness and pain. Occasional bleeding - every once in awhile, no worse. Fairly frequent abdominal cramping. No mucus past. Very frequent urgency to have BM but unable to.    Urinary flow very slow and has to strain. Urinates more than x3 nightly and x9-12 daily. Fairly frequent pain and burning. Fairly frequent urgency. Occasional leakage. Pt is taking Flomax now. Pt has run out of his Predisone.    Nausea has been better - taking Zofran as needed. Pt not eating as well - weight down about 6lbs since last week.

## 2022-11-05 MED FILL — ABIRATERONE 250 MG TABLET: ORAL | 30 days supply | Qty: 120 | Fill #4

## 2022-11-05 NOTE — Unmapped (Signed)
The [facility] has made a third and final attempt to reach this patient to refill the following medication:Abiraterone and Prednisone.      We have left voicemails on the following phone numbers: [phone], have been unable to leave messages on the following phone numbers: [phone], have sent a MyChart message, have sent a text message to the following phone numbers: [phone], and have sent a Mychart questionnaire..    Dates contacted: 11/17,12/1,6  Last scheduled delivery: 09/30/22    The patient may be at risk of non-compliance with this medication. The patient should call the [facility] at [phone]  Option 4, then Option 1 (oncology) to refill medication.    [name] Hulda Humphrey   [facility] Specialty Technician

## 2022-11-05 NOTE — Unmapped (Signed)
[facility] Refill Coordination Note    Specialty Medication(s) to be Shipped:   Hematology/Oncology: Zytiga 250 mg    Other medication(s) to be shipped:  prednisone 5mg , ondansetron 4mg  odt, amlodipine 5mg , atorvastatin 10mg      Patrick Montgomery, DOB: [birth year]  Phone: [phone] (home)       All above HIPAA information was verified with patient.     Was a Nurse, learning disability used for this call? No    Completed refill call assessment today to schedule patient's medication shipment from the [facility] [phone]).  All relevant notes have been reviewed.     Specialty medication(s) and dose(s) confirmed: Regimen is correct and unchanged.   Changes to medications: Ashante reports no changes at this time.  Changes to insurance: No  New side effects reported not previously addressed with a pharmacist or physician: None reported  Questions for the pharmacist: No    Confirmed patient received a Conservation officer, historic buildings and a Surveyor, mining with first shipment. The patient will receive a drug information handout for each medication shipped and additional FDA Medication Guides as required.       DISEASE/MEDICATION-SPECIFIC INFORMATION        N/A    SPECIALTY MEDICATION ADHERENCE     Medication Adherence    Patient reported X missed doses in the last month: 3  Specialty Medication: abiraterone 250 mg tablet (ZYTIGA)  Patient is on additional specialty medications: No  Patient is on more than two specialty medications: No  Any gaps in refill history greater than 2 weeks in the last 3 months: no  Demonstrates understanding of importance of adherence: yes  Informant: patient  Reliability of informant: reliable  Provider-estimated medication adherence level: good  Patient is at risk for Non-Adherence: No  Reasons for non-adherence: no problems identified                                Were doses missed due to medication being on hold? No    abiraterone 250 mg tablet (ZYTIGA)  : 0 days of medicine on hand pt only took 1 tablet today      REFERRAL TO PHARMACIST     Referral to the pharmacist: Not needed      [facility]     Shipping address confirmed in Epic.     Delivery Scheduled: Yes, Expected medication delivery date: 11/05/22.     Medication will be delivered via Same Day Courier to the prescription address in Epic [facility].    Kainoah Bartosiewicz' W Wilhemena Durie [facility] Specialty Technician

## 2022-11-10 ENCOUNTER — Ambulatory Visit: Admit: 2022-11-10 | Discharge: 2022-11-11 | Payer: PRIVATE HEALTH INSURANCE

## 2022-11-10 NOTE — Unmapped (Signed)
Patient left before being seen.

## 2022-11-10 NOTE — Unmapped (Signed)
SiteLast TxDose  ZO:XWRUEAVWU: 11/10/2022: 7,000/7,000 cGy    No diarrhea. Fairly frequent urgency to move bowels. Very frequent abdominal tenderness and pain. Occasional bleeding - no worse. Very frequent abdominal cramping. Occasional mucus past. Very frequent urgency to have BM but unable to.    Urinary flow is fairly easy. Urinates more than x3 nightly and x9-12 daily. Fairly frequent pain and burning.  Very frequent urgency. Occasional leakage. Pt is taking Flomax now.     Nausea has been better - waiting on refill, being sent thru [facility]. Pt still not eating as well - weight down 2lbs since last week.    Dr. Carles Collet tied up with another pt - pt scheduled for 2wks FU.

## 2022-11-10 NOTE — Unmapped (Signed)
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 Plan for Therapy: Continue treatment as planned  2. GU:  Restarted flomax with improvement in flow- continue 0.4mg  every day.  Did trial of ditropan without improvement- can discontinue  3. GI:  Continues to have issues with constipation- reminded him to take stool softener and prn laxative given pain med usage again  4. Mild nausea- unclear if this is from RT or unrelated cause (I.e. constipation)- prn zofran prescribed previously  5. Pain:  Had initially thought this was related to osseous met but no improvement to date with RT so suspect there may be additional components to his pain.  Have been managing with MS contin and prn oxycodone but will need to consider alternative options- he'll discuss with his PCP  6. Hot flashes:  Very bothersome- he'll discuss with med onc  7. Follow-up:  For status check next week    SUBJECTIVE:  No major changes this week.  He did not notice much of a difference in urination since startin ditropan- nocturia 3-4x, + urgency, no dysuria.  He is having more issues with abdominal cramping, tenderness.  No diarrhea.  He feels constipated but has not started taking anything to help his bowels move.  His pain in his sit bone is unchanged from last week.    Baseline Symptoms:  General: Good baseline energy, was working prior to diagnosis. Lives in [place].   Urinary: Severe obstructive symptoms, hematuria at least intermittently, has constipation at baseline  GI: Denies loose stools, rectal pain, or bleeding.   Colonoscopy: Patient states he is current on screening with PCP, lab order for cologuard visibie in care everywhere but results not visible.   Inflammatory bowel disease: Denies     PHYSICAL EXAM:  Vital Signs for this encounter:  Wt 64.9 kg (143 lb 1.6 oz)  - BMI 23.10 kg/m??   Last weight:    Wt Readings from Last 4 Encounters:   11/04/22 64.9 kg (143 lb 1.6 oz)   10/21/22 67.9 kg (149 lb 9.6 oz)   10/14/22 67.8 kg (149 lb 8 oz)   10/07/22 68.4 kg (150 lb 12.8 oz)     General:  Alert and Orientated X 3.  No acute distress.    Skin: Not examined    I have reviewed the patient's dose delivery, dosimetry, lab tests, patient treatment set-up, port films, treatment parameters and x-rays.    Rayetta Humphrey, MD  Assistant Professor  [facility] Dept of Radiation Oncology  11/10/2022

## 2022-11-24 DIAGNOSIS — C61 Malignant neoplasm of prostate: Principal | ICD-10-CM

## 2022-11-24 MED ORDER — OXYCODONE 5 MG TABLET
ORAL_TABLET | Freq: Four times a day (QID) | ORAL | 0 refills | 10 days | Status: CP | PRN
Start: 2022-11-24 — End: ?

## 2022-11-24 NOTE — Unmapped (Signed)
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  He was treated with definitive RT (70Gy/35fx) finished 10/2022.    DURATION SINCE COMPLETION OF RADIOTHERAPY:  2 weeks (11/10/2022)    ASSESSMENT:  Disease Status: Treatment incomplete    RECOMMENDATIONS:  1. FOLLOW-UP:  Med onc in 2 weeks and I can see back in 1 month to follow-up on symptoms  2. GU:  Significant LUTS since finishing treatment in the setting of stopping his flomax- strongly encouraged him to restart this- he has refills at his pharmacy already.  3. GI:  Current issues seem more in line with constipation (possibly from narcotic pain medication use) rather than RT-related issues.  Still not taking bowel regimen- strongly encouraged him to take stool softener and prn miralax as long as he is taking pain meds.  4. Pain:  Worse although some of this seems to be driven by dysuria (likely from LUTS)- restart flomax as above.  Some of this may be related to bladder neck invasion of tumor which will helpfully improve as he gets further out from RT.   had initially was thought a component of his pain was related to a met in the R pubic ramus but it's unchanged since starting RT.  Recommended he discuss with his PCP to see if there are alternative sources of pain as well.  Discussed long-term pain management plan with either his PCP or the palliative care team- he'll discuss with his PCP for now and he'll let me know if he wants referral to palliative care team.  In the interim I have refilled his oxycodone prescription.    INTERVAL HISTORY:      He is 2 weeks out from treatment and has a number of issues today.  First, urination has significantly slowed down and he is straining to urinate.  Associated with incomplete emptying, dysuria, urgency.  He stopped taking flomax ~1 week ago (ran out of prescription and did not pick up refill yet) coinciding with these symptoms.  Still having issues with constipation- has to strain to have a bowel movement and BMs are small pebbles- this is slightly better over the past day or 2.  No nausea.  Pain in his low back/sit bone is still present and seems worse than before but notes that the pain with urination seems to compound it.  Still taking oxycodone 5mg  intermittently but about to run out of these.    REVIEW OF SYSTEMS:  A comprehensive review of 10 systems was negative except for pertinent positives noted in HPI.    PAST MEDICAL HISTORY/FAMILY HISTORY/SOCIAL HISTORY:  Reviewed in EPIC    ALLERGIES/MEDICATIONS:  Reviewed in EPIC    PHYSICAL EXAM:  Vital Signs for this encounter:   BP 124/69  - Pulse 63  - Temp 36.3 ??C (97.4 ??F) (Temporal)  - Resp 17  - Wt 64 kg (141 lb 3.2 oz)  - SpO2 98%  - BMI 22.79 kg/m??   Karnofsky/Lansky Performance Status: 80, Normal activity with effort; some signs or symptoms of disease (ECOG equivalent 1)  General:   No acute distress, alert and oriented X 4   Psychiatric:  Normal mood and affect.  Converses clearly  and emotionally appropriate.   HEENT:  EOMI, MMM  Cardio: RRR, no m/r/g  Respiratory: LCTAB, no w/r/r  Abdomen: Soft, non-tender, non-distended. Normal active bowel sounds  Neuro: CN III-XII intact grossly, AAOx3  Extremities:  No clubbing cyanosis, or edema    RADIOLOGY:  None    Labs:    No results found for: WBC, HGB, HCT, PLT, LDH, CREATININE, AST, ALT, MG    Rayetta Humphrey, MD  Assistant Professor  [facility] Dept of Radiation Oncology  11/24/2022

## 2022-11-24 NOTE — Unmapped (Signed)
No diarrhea. Very frequent urgency to move bowels and abdominal tenderness/pain. Occasional bleeding - no worse. Very frequent abdominal cramping. Occasional mucus past. Very frequent urgency to have BM but unable to.    Urinary flow is very slow. Urinates more than x3 nightly and x9-12 daily. Very frequent pain and burning.  Very frequent urgency. Occasional leakage. Pt has not been taking Flomax for approximately 1 week - needs refill.     Nausea has been ok.

## 2022-12-01 DIAGNOSIS — C61 Malignant neoplasm of prostate: Principal | ICD-10-CM

## 2022-12-01 NOTE — Unmapped (Signed)
[facility] Refill Coordination Note    Patrick Montgomery, [place]: 1961-09-28  Phone: [phone] (home)       All above HIPAA information was verified with patient.         12/01/2022     7:26 AM   Specialty Rx Medication Refill Questionnaire   Which Medications would you like refilled and shipped? Tamsulosin, zophren and abiraterone   Please list all current allergies: None   Have you missed any doses in the last 30 days? No   Have you had any changes to your medication(s) since your last refill? No   How many days remaining of each medication do you have at home? Two weeks   Have you experienced any side effects in the last 30 days? No   Please enter the full address (street address, city, state, zip code) where you would like your medication(s) to be delivered to. [address], [place] [address]   Please specify on which day you would like your medication(s) to arrive. Note: if you need your medication(s) within 3 days, please call the pharmacy to schedule your order at [phone]  12/04/2022   Has your insurance changed since your last refill? No   Would you like a pharmacist to call you to discuss your medication(s)? No   Do you require a signature for your package? (Note: if we are billing Medicare Part B or your order contains a controlled substance, we will require a signature) No   Additional Comments: He has run out of Tamsulosin and zephron. Please set medication inside the storm door.         Completed refill call assessment today to schedule patient's medication shipment from the [facility] [phone]).  All relevant notes have been reviewed.       Confirmed patient received a Conservation officer, historic buildings and a Surveyor, mining with first shipment. The patient will receive a drug information handout for each medication shipped and additional FDA Medication Guides as required.         REFERRAL TO PHARMACIST     Referral to the pharmacist: Not needed      [facility]     Shipping address confirmed in Epic.     Delivery Scheduled: Yes, Expected medication delivery date: 12/04/22.     Medication will be delivered via Same Day Courier to the prescription address in Epic [place].    Patrick Montgomery   [facility] Specialty Technician

## 2022-12-02 DIAGNOSIS — C61 Malignant neoplasm of prostate: Principal | ICD-10-CM

## 2022-12-02 MED ORDER — OXYCODONE 5 MG TABLET
ORAL_TABLET | Freq: Four times a day (QID) | ORAL | 0 refills | 10 days | Status: CP | PRN
Start: 2022-12-02 — End: ?

## 2022-12-02 NOTE — Unmapped (Signed)
Patrick Montgomery continues to have significant low pelvic pain worse from our visit last week.  I have been exchanging messages with he and his wife through [place] but have been unable to reach them on the phone.  We are working to get him back in with labs (including UA) and CT imaging to further evaluate his pain.  I have previously discussed with him that if his pain becomes a chronic issue we will need to find a more appropriate long-term pain med prescriber, but I will refill his pain medications today given ongoing issues.    Rayetta Humphrey, MD  Assistant Professor  [facility] Dept of Radiation Oncology  12/02/2022

## 2022-12-03 ENCOUNTER — Other Ambulatory Visit: Admit: 2022-12-03 | Discharge: 2022-12-03 | Payer: PRIVATE HEALTH INSURANCE

## 2022-12-03 ENCOUNTER — Ambulatory Visit
Admit: 2022-12-03 | Discharge: 2022-12-03 | Payer: PRIVATE HEALTH INSURANCE | Attending: Hematology & Oncology | Primary: Hematology & Oncology

## 2022-12-03 DIAGNOSIS — C61 Malignant neoplasm of prostate: Principal | ICD-10-CM

## 2022-12-03 LAB — POTASSIUM: POTASSIUM: 4 mmol/L (ref 3.5–5.1)

## 2022-12-03 LAB — HEPATIC FUNCTION PANEL
ALBUMIN: 3.6 g/dL (ref 3.4–5.0)
ALKALINE PHOSPHATASE: 76 U/L (ref 46–116)
ALT (SGPT): 24 U/L (ref 10–49)
AST (SGOT): 21 U/L (ref <=34)
BILIRUBIN DIRECT: 0.1 mg/dL (ref 0.00–0.30)
BILIRUBIN TOTAL: 0.4 mg/dL (ref 0.3–1.2)
PROTEIN TOTAL: 6.3 g/dL (ref 5.7–8.2)

## 2022-12-03 LAB — PSA: PROSTATE SPECIFIC ANTIGEN: <0.04 ng/mL (ref 0.00–4.00)

## 2022-12-03 MED ORDER — OXYCODONE 5 MG TABLET
ORAL_TABLET | Freq: Four times a day (QID) | ORAL | 0 refills | 10 days | Status: CN | PRN
Start: 2022-12-03 — End: ?

## 2022-12-03 NOTE — Unmapped (Signed)
Lab Results   Component Value Date    PSA <0.04 12/03/2022    PSA <0.04 10/30/2022    PSA <0.04 10/16/2022    PSA 0.09 10/05/2022    PSA 0.04 09/21/2022    PSA 0.09 09/07/2022      Check labs in 5 weeks.    Please call [phone] to reach my nurse navigator Konrad Penta for any issues.    For emergencies on Nights, Weekends and Holidays  Call [phone] for help.      Griffin Basil, MD, PhD  Associate Professor of Medicine  Division of Oncology    [facility]  Genitourinary Oncology Clinic  Nurse Navigator: Konrad Penta  Fax: [phone]     What is genetic testing, and how it is used?  - Genes are instructions that help your body grow and develop. Some genes specifically help prevent cancer. We are ordering genetic testing of 17 genes, including BRCA1 and BRCA2.   - Genetic testing checks to see if these ???cancer prevention??? genes are working correctly.  Some people have a misspelling in their genes that puts them at an increased risk for prostate cancer and potentially other cancer  - Your blood sample will be collected and shipped to a lab called [facility]. Results of this test take 3 weeks to come back. You may see this result in your [facility] before your next appointment with your provider. If you have questions about this result, please contact your provider.  - This testing will be used to help direct your medical care, both in management of your current cancer and possibly to guide your future cancer screening. The test result can also provide information about the cancer risk for your family members.    How much does the test cost out of pocket?  - Most people pay less than $100 for genetic testing. Your medical insurance will most likely cover all or a large portion of the cost.   - Once [facility] receives your sample, they will contact you with an estimated out-of-pocket cost via text to a mobile number or email. If the cost of testing is too high through insurance, the laboratory has a self-pay price of $250. In this case, you would have to contact [facility] and choose the self-pay option. [facility] also has a financial assistance program that can help with the cost.   - If you are concerned about the cost of your genetic testing, please contact [facility] at [phone] or email [email or web address]     What are the possible results?  - Positive - If one of your cancer prevention genes doesn???t work, we know certain treatment options may work better. In addition, your relatives could have testing to see if they???re at a higher risk of developing cancer in their lifetime.  - Negative -  If you don???t have a gene change, then other treatment options may be recommended.   - Variant of Uncertain Significance (VUS) - Sometimes we get an unclear result, but these are typically treated as negative results. The lab will follow this result over time for additional information about the variant in the scientific literature.     Any possible risks?  - Risks - this test may cost money out of pocket--maximum $250, although most patients pay nothing since the test is often covered by insurance. Some patients are also concerned with privacy of results. There are laws that protect the privacy of your genetic test results.   - Benefits -  this test may help your doctor choose the best treatment for your cancer. It may also provide additional information on cancer risks for you and your family.     What is an electronic consult?  - To help [name] interpret any of your genetic test results, we would like to ask our colleagues in [place] to help [name]. Their team will look at your medical record and ensure we are ordering the best test for you. This is called an ???electronic consult???. This will not require a separate appointment for most patients.   - Most patients will not have an out-of-pocket expense for this. If your insurance does not cover this service, the maximum bill you could receive would be around ~$100. Most patients will pay far less than that.

## 2022-12-03 NOTE — Unmapped (Unsigned)
Blood specimen's obtained via peripheral blood draw and sent to lab for processing. See phlebotomy flowsheet for specific. Anner Baity, RN

## 2022-12-03 NOTE — Unmapped (Unsigned)
[facility] Visit Note  New Patient    Patient Name: Patrick Montgomery  Patient Age: 62 y.o.  Encounter Date: 12/03/2022  Attending Provider:  Laurann Mcmorris E. 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    Patrick Montgomery is a 62 y.o. male who presents for management of newly diagnosed prostate cancer. He was initially seen in the ER In May 2023 for gross hematuria and  worsening LUTS. A CT scan of the bladder revealed a bladder mass. In June 2023, he was found to have a PSA of 18. He underwent cystoscopy, prostate biopsy, and TURP. Prostate biopsy showed Gleason 4+5=9 (GG 5), 10/12 cores positive for cancer. TURP specimen showed mixed acinar/ductal features, cribriborm morphology. In July 2023, CT and bone scan showed no pelvic or distant metastases.    On 06/18/22, we discussed management of his recent diagnosis of prostate cancer. Given that he has been diagnosed with prostate cancer at a very Patrick Montgomery age, I informed the patient that there may be features that indicate a higher risk of hereditary prostate cancer. 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   Based on current imaging, his cancer is confined to the prostate. A PSMA PET scan will be ordered to see if the cancer has metastasized.     On 07/07/2022, pt is now ready to move ahead with treatment. Will proceed with ADT plus abiraterone for two years, per [facility].  PSMA PET still undergoing authorization.    on 08/11/2022, most recent PSA on 08/01/2022 is 0.27. PSMA PET scan from 07/27/2022 showed avidity involving the right inferior pubic ramus which is suspicious for single site of osseous metastatic disease. Will proceed with Eligard shot and proceed with radiation with Dr. Carles Montgomery. May consider radiating single site of metastatic disease at the right inferior pubic ramus     Plan:  Continue ADT with Eligard. Anticipate 3 years of therapy (till 8-07/2025)  -- Eligard shot 08/11/2022, next due 02/09/2023  -- Follow PSA  Continue Abiraterone and prednisone   -- Follow labs/BP per our [facility]; will start a home log and let me know if abnormal readings  -- Continue labs every 2 weeks for the first 3 months until early Dec 2023  Follow up with radiation oncology as scheduled for 09/09/2022 with Dr. Carles Montgomery at [facility]   -- May consider additional radiation to right inferior pubic ramus site of metastatic disease   5. Somatic tumor testing and germline testing to be discussed in the future.  6. RTC 4 weeks for labs, clinic visit; will get labs at [facility] in 2 weeks, standing orders in p lace    Will let me know if bowel habits change    I personally spent 40 minutes face-to-face and non-face-to-face in the care of this patient, which includes all pre, intra, and post visit time on  the date of service.  All documented time was specific to the E/M visit and does not include any procedures that may have been performed.    Reason for Visit  Follow up of prostate cancer.     History of Present Illness:  Oncology History Overview Note   In 03/2022, seen in ER for gross hematuria, worsening LUTS. CT bladder showing bladder mass.  In 04/2022, PSA 18. Had cysto, prostate biopsy, TURP. Prostate biopsy showed Gleason 4+5=9 (GG 5), 10/12 cores positive for cancer. TURP specimen showed mixed acinar/ductal features, cribriborm morphology.  In 05/2022, CT and bone scan showed no pelvic or distant mets  In 06/2022, ADT started with Degarelix, continued with Eligard.  In 06/2022, PSMA PET showed avidity in R inferior pubic ramus, suspicious for single bone met.  In 07/2022, abiraterone/prednisone started.     Prostate cancer (CMS-HCC)   06/17/2022 Initial Diagnosis    Prostate cancer (CMS-HCC)     06/17/2022 -  Cancer Staged    Staging form: Prostate, AJCC 8th Edition  - Clinical: Stage IIIC (cT4, cN0, cM0, PSA: 18, Grade Group: 5) - Signed by Patrick Boettcher, MD on 06/19/2022       07/07/2022 - 07/07/2022 Endocrine/Hormone Therapy    OP DEGARELIX  Plan Provider: Maurie Boettcher, MD     08/11/2022 Endocrine/Hormone Therapy    OP PROSTATE LEUPROLIDE (ELIGARD) 45 MG EVERY 6 MONTHS  Plan Provider: Maurie Boettcher, MD     09/10/2022 -  Radiation    Radiation Therapy Treatment Details (Noted on 09/10/2022)  Site: Prostate  Technique: IMRT  Goal: No goal specified  Planned Treatment Start Date: No planned start date specified         Interval history  The patient returns to clinic, unaccompanied. He received his Abiraterone about 2-3 weeks ago and started this along with the prednisone. Energy level and appetite have been good and he feels everything is stable. Reports a constant pain in the right perineum/thigh, that is stable since first noticing it prior to his last appt. No bladder or bowel dysfunction. He is having daily/nightly hot flashes he feels are stable.       Interval hx 09/07/2022    He has been taking his abi/pred for about a month, remembers to take it every day  He is having bowel movements 3-4 times per day. They are solid, without abd pain/cramping or rectal bleeding  + hot flashes  Sleeping good. Appetite good, weight stable  Has been checking BP a few days/week but doesn't remember what the readings are  Gets labs at [facility]      Allergies:  No Known Allergies    Current Medications:    Current Outpatient Medications:     abiraterone (ZYTIGA) 250 mg tablet, Take 4 tablets (1,000 mg total) by mouth daily., Disp: 120 tablet, Rfl: 11    amlodipine (NORVASC) 5 MG tablet, Take 1 docusate sodium (COLACE) 100 MG capsule, Take 1 capsule (100 mg total) by mouth two (2) times a day., Disp: 60 capsule, Rfl: 11    morphine (MS CONTIN) 15 MG 12 hr tablet, Take 1 tablet (15 mg total) by mouth every twelve (12) hours., Disp: 30 tablet, Rfl: 0    ondansetron (ZOFRAN-ODT) 4 MG disintegrating tablet, Take 1 tablet (4 mg total) by mouth every eight (8) hours as needed for nausea for up to 20 days., Disp: 30 tablet, Rfl: 1    oxybutynin (DITROPAN) 5 MG tablet, Take 1 tablet (5 mg  total) by mouth Three (3) times a day., Disp: 90 tablet, Rfl: 11    oxyCODONE (ROXICODONE) 5 MG immediate release tablet, Take 1 tablet (5 mg total) by mouth every six (6) hours as needed for pain., Disp: 40 tablet, Rfl: 0    polyethylene glycol (MIRALAX) 17 gram packet, Take 17 g by mouth Two (2) times a day., Disp: 100 packet, Rfl: 2    predniSONE (DELTASONE) 5 MG tablet, Take 1 tablet (5 mg total) by mouth daily., Disp: 30 tablet, Rfl: 11    predniSONE (DELTASONE) 5 MG tablet, Take 1 tablet (5 mg total) by mouth daily., Disp: 14 tablet, Rfl: 0    SENNA 8.6 mg tablet, Take 1 tablet by mouth daily., Disp: 30 tablet, Rfl: 5    tamsulosin (FLOMAX) 0.4 mg capsule, Take 1 capsule (0.4 mg total) by mouth daily., Disp: 30 capsule, Rfl: 11    Past Medical History and Social History  Past Medical History:   Diagnosis Date    Anxiety     Arthritis     Basal cell carcinoma     Cancer (CMS-HCC)     skin    Gout     Kidney stones     Squamous cell skin cancer     Stroke (CMS-HCC)       Past Surgical History:   Procedure Laterality Date    PR BIOPSY OF PROSTATE,NEEDLE/PUNCH N/A 05/21/2022    Procedure: IMAGE GUIDED BIOPSY, PROSTATE; NEEDLE OR PUNCH, SINGLE OR MULTIPLE, ANY APPROACH;  Surgeon: Phillips Grout, MD;  [facility];  Service: Urology    PR TRANSURETHRAL ELEC-SURG PROSTATECTOM N/A 05/21/2022    Procedure: TRANSURETHRAL ELECTROSURGICAL RESECT PROSTATE, W/CONTORL POSTOP BLEED, COMPLETE (VASECT, MEATOT, CYSTO Reported on 11/24/2022), Disp: 14 tablet, Rfl: 0    SENNA 8.6 mg tablet, Take 1 tablet by mouth daily. (Patient not taking: Reported on 09/09/2022), Disp: 30 tablet, Rfl: 5    tamsulosin (FLOMAX) 0.4 mg capsule, Take 1 capsule (0.4 mg total) by mouth daily., Disp: 30 capsule, Rfl: 11    Past Medical History and Social History  Past Medical History:   Diagnosis Date    Anxiety     Arthritis     Basal cell carcinoma     Cancer (CMS-HCC)     skin    Gout     Kidney stones     Squamous cell skin cancer     Stroke (CMS-HCC)       Past Surgical History:   Procedure Laterality Date    PR BIOPSY OF PROSTATE,NEEDLE/PUNCH N/A 05/21/2022    Procedure: IMAGE GUIDED BIOPSY, PROSTATE; NEEDLE OR PUNCH, SINGLE OR MULTIPLE, ANY APPROACH;  Surgeon: Phillips Grout, MD;  [facility]: [facility] OR [facility];  Service: Urology    PR TRANSURETHRAL ELEC-SURG PROSTATECTOM N/A 05/21/2022    Procedure: TRANSURETHRAL ELECTROSURGICAL RESECT PROSTATE, W/CONTORL POSTOP BLEED, COMPLETE (VASECT, MEATOT, CYSTO ETC);  Surgeon: Phillips Grout, MD;  [facility];  Service: Urology    SKIN BIOPSY      skin cancer removal           Social History     Occupational History    Not on file   Tobacco Use    Smoking status: Every Day     Current packs/day: 1.50     Average packs/day: 1.5 packs/day for 45.0 years (67.5 ttl pk-yrs)     Types: Cigarettes    Smokeless tobacco: Never   Vaping Use  Vaping Use: Never used   Substance and Sexual Activity    Alcohol use: No     Alcohol/week: 0.0 standard drinks of alcohol     Comment: socially     Drug use: Yes     Types: Marijuana    Sexual activity: Not Currently       Family History  Family History   Problem Relation Age of Onset    Cancer Father         Skin    Cancer Brother         skin     Substance Abuse Disorder Neg Hx      Prostate Cancer Family History Assessment:  History of cancer in children (yes/no; if yes, what type AND age of diagnosis): No  Total number of siblings (including deceased): 2  History of cancer in siblings (yes/no; if yes, provide relation, type of cancer, AND age of diagnosis): Brother superficial basal cell; age unknown  History of cancer in parents (yes/no; if yes, please specify parent, type of cancer, AND age of diagnosis): Father superficial basal cell carcinoma; age unknown  History of cancer in aunts/uncles/grandparents (yes/no; if yes, provide relation, type of cancer, AND age of diagnosis): No      Review of Systems:  A comprehensive review of 10 systems was negative except for pertinent positives noted in HPI.    Physical Exam:    VITAL SIGNS:  There were no vitals taken for this visit.  ECOG Performance Status: 1  GENERAL: Well-developed, well-nourished patient in no acute distress.  HEAD: Normocephalic and atraumatic.  EYES: Conjunctivae are normal. No scleral icterus.  MOUTH/THROAT: Oropharynx is clear and moist.  No mucosal lesions.  NECK: Supple, no thyromegaly.  LYMPHATICS: No palpable cervical, supraclavicular, or axillary adenopathy.  CARDIOVASCULAR: Normal rate, regular rhythm and normal heart sounds.  Exam reveals no gallop and no friction rub.  No murmur heard.  PULMONARY/CHEST: Effort normal and breath sounds normal. No respiratory distress.  ABDOMINAL:  Soft. There is no distension. There is no tenderness. There is no rebound and no guarding.  MUSCULOSKELETAL: No clubbing, cyanosis, or lower extremity edema.  PSYCHIATRIC: Alert and oriented.  Normal mood and affect.  NEUROLOGIC: No focal motor deficit. Normal gait.  SKIN: Skin is warm, dry, and intact.      Results/Orders:    No visits with results within 2 Week(s) from this visit.   Latest known visit with results is:   Appointment on 10/30/2022   Component Date Value Ref Range Status    PSA 10/30/2022 <0.04  0.00 - 4.00 ng/mL Final    Potassium 10/30/2022 4.0  3.5 - 5.1 mmol/L Final    Albumin 10/30/2022 3.4  3.4 - 5.0 g/dL Final    Total Protein 10/30/2022 5.9  5.7 - 8.2 g/dL Final    Total Bilirubin 10/30/2022 0.4  0.3 - 1.2 mg/dL Final    Bilirubin, Direct 10/30/2022 0.20  0.00 - 0.30 mg/dL Final    AST 29/56/2130 15  <=34 U/L Final    ALT 10/30/2022 17  10 - 49 U/L Final    Alkaline Phosphatase 10/30/2022 73  46 - 116 U/L Final       Lab Results   Component Value Date    PSA <0.04 10/30/2022    PSA <0.04 10/16/2022    PSA 0.09 10/05/2022    PSA 0.04 09/21/2022    PSA 0.09 09/07/2022    PSA 0.14 08/21/2022    PSA 0.27 08/07/2022  PSA 1.66 07/24/2022    PSA 13.43 (H) 07/07/2022    PSA 14.24 (H) 06/18/2022         Orders placed or performed in visit on 12/02/22    Specialty Prescription Med Authorization    Specialty Prescription Med Authorization       Molecular Pathology  Tumor mutation profiling  N/A  Germline testing  N/A    Pathology:  A: Prostate, right apex, core biopsy  - Prostatic adenocarcinoma, Gleason score 4 + 4 = 8 (Grade group 4) involving 2 of 2 cores, approximately 4 and 1 mm in linear extent, approximately 20% of total core length.      B: Prostate, left apex, core biopsy  - Prostatic adenocarcinoma, Gleason score 4 + 4 = 8 (Grade group 4) involving 2 of 2 cores, approximately 7 and 5 mm in linear extent, approximately 60% of total core length.   - Perineural invasion identified in this case.      C: Prostate, left mid, core biopsy  - Prostatic adenocarcinoma, Gleason score 4 + 5 = 9 (Grade group 5) involving 2 of 2 cores, approximately 6 and 3mm in linear extent, approximately 90% of total core length.      D: Prostate, right mid, core biopsy  - Prostatic adenocarcinoma, Gleason score 4 + 5 = 9 (Grade group 5) involving 2 of 2 cores, approximately 6 and 5 mm in linear extent, approximately 70% of total core length.      E: Prostate, right base, core biopsy  - Benign prostate tissue.      F: Prostate, left base, core biopsy  - Prostatic adenocarcinoma, Gleason score 4 + 4 = 8 (Grade group 4)  involving 2 of 2 cores, approximately 3 and 1 mm in linear extent, approximately 20% of total core length.      G: Bladder neck and bilateral median lobe, transurethral resection  - Involved by prostatic adenocarcinoma with mixed acinar and ductal features, Gleason score 4 + 4 = 8 (Grade group 4)    - Lymphovascular invasion identified     The pattern 4 in this case demonstrates cribriform morphology.      Imaging results:    CT Chest w Contrast 06/11/22:   No intrathoracic metastasis.       CT Abdomen Pelvis W Contrast 06/11/22:     Enlarged irregular prostate correlates with history of biopsy-proven prostate cancer. There is redemonstrated irregularity of the posterior bladder and increased irregularity of the prostate at the bladder margin compared to prior may be secondary to tumor versus postsurgical/post TURP change. Consider attention on follow-up versus further evaluation with PSMA PET scan.       Questionable sclerotic changes of the right inferior pubic ramus are indeterminate and may represent normal variation although metastatic osseous disease is not excluded. Attention on follow-up bone scan. There is no other evidence of metastatic disease in the abdomen/pelvis.       Additional chronic and incidental findings as described.       Please see same day CT chest report for detailed findings above the diaphragm     NM Bone Scan Whole Body 06/11/22  No findings suggestive of osseous metastatic disease     PET CT 07/27/2022:   Impression   1.Ill-defined tracer avidity associated with the prostate, left greater than right, likely representing biopsy-proven prostate carcinoma.   2.Moderate avidity involving the right inferior pubic ramus which is suspicious for single site of osseous metastatic disease. Otherwise, no definite  evidence of additional metastatic disease. More specifically, no avid adenopathy. Subcentimeter pulmonary nodules which are below the resolution of PET/CT and require continued attention on follow-up.

## 2022-12-04 MED FILL — ONDANSETRON 4 MG DISINTEGRATING TABLET: ORAL | 10 days supply | Qty: 30 | Fill #1

## 2022-12-04 MED FILL — ABIRATERONE 250 MG TABLET: ORAL | 30 days supply | Qty: 120 | Fill #5

## 2022-12-07 DIAGNOSIS — C61 Malignant neoplasm of prostate: Principal | ICD-10-CM

## 2022-12-13 DIAGNOSIS — E785 Hyperlipidemia, unspecified: Principal | ICD-10-CM

## 2022-12-13 DIAGNOSIS — I63231 Cerebral infarction due to unspecified occlusion or stenosis of right carotid arteries: Principal | ICD-10-CM

## 2022-12-13 MED ORDER — ATORVASTATIN 10 MG TABLET
ORAL_TABLET | Freq: Every day | ORAL | 3 refills | 0 days
Start: 2022-12-13 — End: ?

## 2022-12-15 ENCOUNTER — Ambulatory Visit: Admit: 2022-12-15 | Payer: PRIVATE HEALTH INSURANCE | Attending: Radiation Oncology | Primary: Radiation Oncology

## 2022-12-15 ENCOUNTER — Ambulatory Visit: Admit: 2022-12-15 | Discharge: 2022-12-16 | Payer: PRIVATE HEALTH INSURANCE

## 2022-12-15 DIAGNOSIS — C61 Malignant neoplasm of prostate: Principal | ICD-10-CM

## 2022-12-15 DIAGNOSIS — C7989 Secondary malignant neoplasm of other specified sites: Principal | ICD-10-CM

## 2022-12-15 LAB — URINALYSIS WITH MICROSCOPY WITH CULTURE REFLEX
BILIRUBIN UA: NEGATIVE
GLUCOSE UA: NEGATIVE
KETONES UA: NEGATIVE
LEUKOCYTE ESTERASE UA: NEGATIVE
NITRITE UA: NEGATIVE
PH UA: 6 (ref 5.0–9.0)
RBC UA: 9 /HPF — ABNORMAL HIGH (ref <=3)
SPECIFIC GRAVITY UA: 1.015 (ref 1.005–1.040)
SQUAMOUS EPITHELIAL: <1 /HPF (ref 0–5)
UROBILINOGEN UA: <2
WBC UA: 3 /HPF — ABNORMAL HIGH (ref <=2)

## 2022-12-15 MED ORDER — ATORVASTATIN 10 MG TABLET
ORAL_TABLET | Freq: Every day | ORAL | 3 refills | 90 days | Status: CP
Start: 2022-12-15 — End: ?

## 2022-12-15 MED ORDER — OXYCODONE 5 MG TABLET
ORAL_TABLET | Freq: Four times a day (QID) | ORAL | 0 refills | 10 days | Status: CP | PRN
Start: 2022-12-15 — End: ?

## 2022-12-15 MED ADMIN — iohexol (OMNIPAQUE) 350 mg iodine/mL solution 100 mL: 100 mL | INTRAVENOUS | @ 18:00:00 | Stop: 2022-12-15

## 2022-12-15 NOTE — Unmapped (Signed)
No diarrhea. Pt having issues with constipation - taking Miralax only once. Very frequent urgency to move bowels and abdominal tenderness/pain. Occasional bleeding - no change. Very frequent abdominal cramping. Some passing of mucus. Very frequent urgency to have BM but unable to have BM.    Urinary flow is very slow. Urinates more than x3 nightly and x9-12 daily. Very frequent pain and burning.  Very frequent urgency. Occasional leakage. Pt taking Flomax once daily.     Nausea better - only had issues for approximately x3 days since finishing radiation.

## 2022-12-15 NOTE — Unmapped (Signed)
Patient is requesting the following refill  Requested Prescriptions     Pending Prescriptions Disp Refills    atorvastatin (LIPITOR) 10 MG tablet [Pharmacy Med Name: ATORVASTATIN 10 MG TABLET] 90 tablet 3     Sig: TAKE 1 TABLET BY MOUTH EVERY DAY       Recent Visits  Date Type Provider Dept   05/06/22 Office Visit Deneise Lever, FNP [facility] [address] [facility]   04/15/22 Office Visit Johnn Hai, Loleta Rose, FNP [facility] [address] [facility]   02/03/22 Office Visit Johnn Hai, Loleta Rose, FNP [facility]   Showing recent visits within past 365 days with a meds authorizing provider and meeting all other requirements  Future Appointments  No visits were found meeting these conditions.  Showing future appointments within next 365 days with a meds authorizing provider and meeting all other requirements

## 2022-12-16 NOTE — Unmapped (Signed)
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  He was treated with definitive RT (70Gy/67fx) finished 10/2022.    DURATION SINCE COMPLETION OF RADIOTHERAPY:  5 weeks (11/10/2022)    ASSESSMENT:  Disease Status: Treatment incomplete    RECOMMENDATIONS:  1. FOLLOW-UP:  I will see back in 4 weeks, med onc in 2 months  2. GU:  LUTS improved on flomax, but now having more urgency, frequency, dysuria- will check UA today  3. GI:  Continues to have constipation (likely from narcotic pain medication usage) and still not taking bowel regimen which we have discussed on numerous occasions.  Emphasized that constipation can certainly be contributing to his pain and again reviewed a bowel regimen for him to take at home.  4. Pain:  This has been an ongoing issue predating radiation treatments.  The exact etiology is unclear and CT from today does not show any new findings from prior.  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  Unfortunately the met was treated with his recent RT treatment and pain did not improve.  Constipation may be contributing as above.  Also with new urinary issues could certainly have a UTI as well.  I also wonder if some of this may be related to his bladder neck invasion/prior TURP.  Reviewed bowel regimen, UA to evaluate for UTI.  We have previously discussed a long term pain-management strategy- he had previously expressed an interest in discussing this with his PCP but he has not done so- I also offered a referral to the palliative care team which he is interested in today.  In the meantime I will refill his pain medications.    INTERVAL HISTORY:      Seen at his request for follow-up of pain issues.  Since he was last seen in clinic he has had ongoing issues with pain- this is similar to the pain that he's been experiencing for months roughly located in the perineal area.  At our last visit he was having significant LUTS as he had stopped taking his flomax- he restarted that and the flow improved but he continues to have lots of frequency and now starting to have som dysuria.  No hematuria.  He also continues to have constipation- he has hard, painful bowel movements.  He has miralax but has only taken it once or twice since our last visit.  He was taking oxycodone but ran out a week ago- says it helps some but does not completely control the pain.    REVIEW OF SYSTEMS:  A comprehensive review of 10 systems was negative except for pertinent positives noted in HPI.    PAST MEDICAL HISTORY/FAMILY HISTORY/SOCIAL HISTORY:  Reviewed in EPIC    ALLERGIES/MEDICATIONS:  Reviewed in EPIC    PHYSICAL EXAM:  Vital Signs for this encounter:   BP 125/68  - Pulse 70  - Temp 36.5 ??C (97.7 ??F) (Temporal)  - Resp 17  - Wt 64.2 kg (141 lb 9.6 oz)  - SpO2 99%  - BMI 22.85 kg/m??   Karnofsky/Lansky Performance Status: 80, Normal activity with effort;  some signs or symptoms of disease (ECOG equivalent 1)  General:   No acute distress, alert and oriented X 4   Psychiatric:  Normal mood and affect.  Converses clearly and emotionally appropriate.   HEENT:  EOMI, MMM  Cardio: RRR, no m/r/g  Respiratory: LCTAB, no w/r/r  Abdomen: Soft, non-tender, non-distended. Normal active bowel sounds  Neuro: CN III-XII intact grossly, AAOx3  Extremities:  No clubbing cyanosis, or edema    RADIOLOGY:   CT A/P 12/15/2022 personally reviewed  --Mild rectosigmoid wall thickening without surrounding inflammatory changes, favored secondary to radiation changes, with additional imaging findings suggestive of slow bowel transit.      --Increased conspicuity of right inferior pubic ramus mixed lytic sclerotic lesion which may reflect posttreatment changes versus worsening site of disease. No new osseous metastases.         Labs:    Lab Results   Component Value Date    AST 21 12/03/2022    ALT 24 12/03/2022       Rayetta Humphrey, MD  Assistant Professor  [facility] Dept of Radiation Oncology  12/16/2022

## 2022-12-17 DIAGNOSIS — C61 Malignant neoplasm of prostate: Principal | ICD-10-CM

## 2022-12-17 MED ORDER — SENNOSIDES 8.6 MG TABLET
ORAL_TABLET | Freq: Every day | ORAL | 5 refills | 30 days | Status: CP
Start: 2022-12-17 — End: ?

## 2022-12-17 NOTE — Unmapped (Signed)
Called and left pt message that he will not need to come to [facility] to see Dr Carles Collet on 1/23 per Dr Carles Collet and we have moved the appt to 2/15 at 2 pm in [facility]. Left pt info and callback # for any questions, concerns and changes

## 2022-12-18 MED FILL — PREDNISONE 5 MG TABLET: ORAL | 30 days supply | Qty: 30 | Fill #5

## 2022-12-24 DIAGNOSIS — C61 Malignant neoplasm of prostate: Principal | ICD-10-CM

## 2022-12-28 DIAGNOSIS — R310 Gross hematuria: Principal | ICD-10-CM

## 2022-12-28 NOTE — Unmapped (Signed)
Called to schedule for local cysto with Dr Laural Benes      Thanks

## 2022-12-29 DIAGNOSIS — C61 Malignant neoplasm of prostate: Principal | ICD-10-CM

## 2022-12-29 MED ORDER — OXYCODONE 5 MG TABLET
ORAL_TABLET | Freq: Four times a day (QID) | ORAL | 0 refills | 13 days | Status: CP | PRN
Start: 2022-12-29 — End: ?

## 2022-12-29 NOTE — Unmapped (Signed)
This patient just called in and said he went to the pain clinic that Dr Carles Collet had recommended but he is out of pain medicine and they will not call him back.  He was wondering if Dr Carles Collet could help him with this and asked if someone would give him a call. Have passed this info on to the provider.

## 2022-12-29 NOTE — Unmapped (Signed)
Patient left message on triage line requesting pain med. Forwarded to Dr. Carles Collet

## 2023-01-05 NOTE — Unmapped (Signed)
The [facility] has made a second and final attempt to reach this patient to refill the following medication:Abiraterone 250 mg and Prednisone.      We have left voicemails on the following phone numbers: [phone], have sent a text message to the following phone numbers: [phone], and have sent a Mychart questionnaire..    Dates contacted: 1/24,30  Last scheduled delivery: 12/04/22    The patient may be at risk of non-compliance with this medication. The patient should call the [facility] at [phone]  Option 4, then Option 1 (oncology) to refill medication.    [name] Hulda Humphrey   [facility] Specialty Technician

## 2023-01-07 ENCOUNTER — Ambulatory Visit: Admit: 2023-01-07 | Discharge: 2023-01-08 | Payer: PRIVATE HEALTH INSURANCE

## 2023-01-07 DIAGNOSIS — C61 Malignant neoplasm of prostate: Principal | ICD-10-CM

## 2023-01-07 DIAGNOSIS — R3916 Straining to void: Principal | ICD-10-CM

## 2023-01-07 DIAGNOSIS — N401 Enlarged prostate with lower urinary tract symptoms: Principal | ICD-10-CM

## 2023-01-07 LAB — POTASSIUM: POTASSIUM: 4.1 mmol/L (ref 3.5–5.1)

## 2023-01-07 LAB — HEPATIC FUNCTION PANEL
ALBUMIN: 3.7 g/dL (ref 3.4–5.0)
ALKALINE PHOSPHATASE: 88 U/L (ref 46–116)
ALT (SGPT): 22 U/L (ref 10–49)
AST (SGOT): 21 U/L (ref <=34)
BILIRUBIN DIRECT: 0.1 mg/dL (ref 0.00–0.30)
BILIRUBIN TOTAL: 0.3 mg/dL (ref 0.3–1.2)
PROTEIN TOTAL: 6.2 g/dL (ref 5.7–8.2)

## 2023-01-07 LAB — PSA: PROSTATE SPECIFIC ANTIGEN: <0.04 ng/mL (ref 0.00–4.00)

## 2023-01-07 MED ORDER — DOCUSATE SODIUM 100 MG CAPSULE
ORAL_CAPSULE | Freq: Two times a day (BID) | ORAL | 11 refills | 30 days
Start: 2023-01-07 — End: ?

## 2023-01-07 MED ORDER — TAMSULOSIN 0.4 MG CAPSULE
ORAL_CAPSULE | Freq: Every day | ORAL | 11 refills | 30.00000 days | Status: CP
Start: 2023-01-07 — End: ?

## 2023-01-08 MED ORDER — DOCUSATE SODIUM 100 MG CAPSULE
ORAL_CAPSULE | Freq: Two times a day (BID) | ORAL | 11 refills | 30 days | Status: CP
Start: 2023-01-08 — End: ?

## 2023-01-11 ENCOUNTER — Ambulatory Visit
Admit: 2023-01-11 | Discharge: 2023-01-12 | Payer: PRIVATE HEALTH INSURANCE | Attending: Student in an Organized Health Care Education/Training Program | Primary: Student in an Organized Health Care Education/Training Program

## 2023-01-11 DIAGNOSIS — Z515 Encounter for palliative care: Principal | ICD-10-CM

## 2023-01-11 DIAGNOSIS — G893 Neoplasm related pain (acute) (chronic): Principal | ICD-10-CM

## 2023-01-11 DIAGNOSIS — K5900 Constipation, unspecified: Principal | ICD-10-CM

## 2023-01-11 DIAGNOSIS — C61 Malignant neoplasm of prostate: Principal | ICD-10-CM

## 2023-01-11 MED ORDER — OXYCODONE 5 MG TABLET
ORAL_TABLET | ORAL | 0 refills | 14 days | Status: CP | PRN
Start: 2023-01-11 — End: ?

## 2023-01-11 MED ORDER — NAPROXEN 500 MG TABLET
ORAL_TABLET | Freq: Two times a day (BID) | ORAL | 0 refills | 10 days | Status: CP
Start: 2023-01-11 — End: ?

## 2023-01-11 MED ORDER — LIDOCAINE 5 % TOPICAL OINTMENT
0 refills | 0 days | Status: CP
Start: 2023-01-11 — End: 2024-01-11

## 2023-01-11 MED ORDER — SENNOSIDES 8.6 MG TABLET
ORAL_TABLET | Freq: Every day | ORAL | 5 refills | 15 days | Status: CP
Start: 2023-01-11 — End: ?

## 2023-01-11 MED ORDER — ACETAMINOPHEN 500 MG TABLET
ORAL_TABLET | Freq: Two times a day (BID) | ORAL | 2 refills | 25 days | Status: CP | PRN
Start: 2023-01-11 — End: 2024-01-11

## 2023-01-11 MED ORDER — POLYETHYLENE GLYCOL 3350 17 GRAM ORAL POWDER PACKET
PACK | Freq: Two times a day (BID) | ORAL | 2 refills | 50 days | Status: CP
Start: 2023-01-11 — End: ?

## 2023-01-11 MED ORDER — NALOXONE 4 MG/ACTUATION NASAL SPRAY
3 refills | 0 days | Status: CP
Start: 2023-01-11 — End: ?

## 2023-01-11 NOTE — Unmapped (Signed)
Here is some additional information about [facility]:     What is Palliative Care?  Palliative Care is medical care that focuses on managing symptoms of cancer or side effects from cancer treatment. The Outpatient Oncology Palliative Care service uses a team approach to help patients and their caregivers with the goal of maintaining the best quality of life possible during a cancer diagnosis and cancer treatment.   The Outpatient Oncology Palliative Care team serves all outpatient oncology clinics including surgery, gynecology, medicine and radiation oncology.      Who provides Outpatient Palliative Care?  The Outpatient Oncology Palliative Care team includes doctors, nurses, pharmacists, social workers, counselors and nutritionists. The team works with a patient's primary oncology team to create care plans that can help manage symptoms related to cancer and cancer treatment.     What services are offered by Outpatient Oncology Palliative Care?  We can help cancer patients with:  Pain  Symptoms such as nausea, vomiting, anxiety, depression, fatigue, loss of appetite of shortness of breath  Emotional, psychological, or spiritual suffering  Nutritional problems  Concern about medical decisions  Difficulty communicating values, goals and personal choices.     Who are the Outpatient Oncology Palliative Care team members?  Physicians: Julie Childers, MD; Kenzie Daniels, MD; Clarissa Durand-Rougely, MD; Jocilyn Trego, MD; Kyle Lavin, MD  Physician Fellows: Benjamin Frush, MD; Andy Liu, MD; Ana Mishra, MD; Conny Morrison, MD  Nurse Practitioner: Cindy Kelly, FNP  Clinical Pharmacist Practitioner: Vineeta Rao, PharmD, CPP  Nurse Clinical Coordinator: Jenny Hanspal, RN, BSN, MS, OCN  Administrative Specialist: Christine McGrath     When can I see the Outpatient Oncology Palliative Care team and how do I make contact in between visits?  We see patients Monday through Friday 8am - 4:30pm. If you have questions or concerns during clinic hours, please contact us. Any requests made outside of business hours will be addressed the following business day.      Palliative Care nurse line: [phone].    For palliative care appointments & questions Monday through Friday 8 AM-- 4:30 PM:  please call [phone].    For [facility] general questions Monday through Friday 8 AM-- 4:30 PM:  call [phone] or Toll free [phone].       On Nights, Weekends and Holidays:  Call [phone] and ask for the Oncologist on call.

## 2023-01-11 NOTE — Unmapped (Signed)
OUTPATIENT ONCOLOGY PALLIATIVE CARE CONSULT NOTE    Patrick Montgomery is a 62 y.o. male who is seen in consultation at the request of [name] for evaluation of Symptoms     Principal Diagnosis: Patrick Montgomery is a 62 y.o. male with prostate cancer, diagnosed in June 2023 now on ADT with Eliguard.  Disease sites include prostate, ?solitary bone metastasis.     Assessment/Plan:     #Cancer-Related Pain: Severe.  Experiencing severe pain in the right perineal area.  Unclear if cancer related although this does seem to correlate with CT finding of increased conspicuity of right inferior pubic ramus mixed lytic sclerotic lesion.  Unclear if pain is from this point disease versus radiation changes versus to what degree his severe constipation is contributing.  May also have neuropathic component given reported intermittent radiating pain down right leg and up his back.  Prior Medications: MS contin - didn't find effective.  Oxycodone 5 mg effective.  - Trial naproxen 500mg  BID x 10 days  - Increase tylenol to 1000 mg BID  - Continue oxycodone 5 mg every 4 hours PRN with goal of eventual wean  - Trial Lidocaine ointment to tender area of skin  - Consider reimaging of pelvis/perineum if pain worsens or persist    #Cancer-Related Fatigue: Stable. STill able to be active.   - CTM  - Encouraged light exercise as able    #Constipation: Worse.  Reports hard, pebble-like bowel movements.  Has been requiring milk of magnesia to have a good bowel movement, and has not had 1 for at least several days.  - Start senna 2 tablets nightly  - Start miralax BID  - Low threshold to escalate regimen vs. Start naloxegol    #Mood/Coping: Stable.  Good support from his wife, mother and children. Stays positive by nature.     #Goals of Care/Prognostic Awareness: Knows he has prostate cancer and is followed by Dr. Regino Schultze.  Knows that recent PSA was good.  Understands that he is taking medicine through 2026 for this.  Unsure if his disease is curable or not and what the overall trajectory of his prognosis is, but would like to know.  Is a type of person who would want to know details about his disease and prognosis going forward.    #Advance Care Planning:  HCDM:   HCDM (patient stated preference): Patrick Montgomery - Domestic Partner [phone]  Natural surrogate decision maker: majority of adult children and living parents  Advance Directive: none on file - wants to do HCPOA    Advance Care Planning     Participants: Philip Aspen, Redgie Grayer, MD, Cherrie Gauze, MD  Discussion/Action:   - Patient shares his understanding that he has metastatic prostate cancer. He does not recall much about what to expect from this cancer, including about prognosis. He knows that there are several treatment options, but is not sure whether it is curable or not.   - We asked whether he is the type of person who wants to know details about prognosis and he reports that he would like to know. He appreciates honest and direct communication. We said that we would discuss with oncology so that we can provide accurate communication.   - Finally, we asked who his preferred HCDM is and he says that it is his domestic partner, Patrick Montgomery. We discussed that she is would not be default HCPOA by state law and recommended completing formal HCPOA paperwork to allow her to be his  surrogate if needed in the future.     Health Care Decision Maker as of 01/11/2023    HCDM (patient stated preference): Patrick Montgomery - Domestic Partner - [phone]    I spent 16 minutes in voluntary ACP services.          #Controlled substances risk management.   - Patient Will schedule follow-up with [facility] to review opioid safety strategies and complete clinic opioid safety agreement.   - [place] database was reviewed today and it was appropriate.   - Urine drug screen was not performed at this visit. Findings: not applicable.   - Patient has received information about safe storage and administration of medications.   - Patient has received a prescription for narcan and patient is not applicable.     F/u: Pharmacy visit + 1 mo follow up    ----------------------------------------    Referring Provider: [name]  Oncology Team: [name]  PCP: Deneise Lever, FNP    HPI: Mr. Volkov is a 62 year old male with history of prostate cancer establishing with palliative care today primarily for pain control.    In terms of pain, patient locates the pain just to the right of his rectum, sometimes with radiation down his leg and up his back. He describes the pain as a sharp and excruciating pain. He notes that it is constant. It is worse after moving around all day and worse when sitting down.  It feels better if he massages the area.  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    Oxycodone was helping but he ran out 5 days ago. Tried tylenol 1500 mg in the morning without benefit. Hasn't tried any NSAIDs.  A couple of months ago was written for MS Contin, which he reports did not help at all.  In general, he notes some fears about becoming hooked on pain medicine.  He is honest about his past and many years ago was using crack cocaine regularly.  He quit 17 years ago when his daughter was born and has not looked back since.  He endorses some marijuana use, but denies other substance use including alcohol or other drugs at this time.  In general, he likes to take as few medicines as possible.    He reports ongoing constipation. He describes hard, pebble-like stools. He hasn't been taking anything for his bowels.  In the past, he has used milk of magnesia as needed to have a good bowel movement but he has not had a good 1 in the past several days.  He has senna at home as well as pure-lax but has not used either 1 lately.  He does note in the past he has had issues with stool ball that required him to manually disimpact himself, and hopes to avoid this in the future.    He denies any nausea or vomiting.  His energy level is good.  He sleeps fine.  He reports good mood overall and denies depression or anxiety.  He gets around fine at home and is very independent.    He lives with his partner Patrick Montgomery.  He has 2 children; one of them is 72 and lives away from home (owns a Tree surgeon), and the other one is 18 and lives at home with them.  She is graduating high school soon and interested in criminal justice.  His mother is  an additional source of support and guidance.    Symptom Review:  Pain: Severe, as above  Fatigue: None  Mobility: No issues  Sleep: No issues  Appetite: No issues  Nausea: No issues  Bowel function: Severe constipation as above  Dyspnea: None  Mood: Good    Palliative Performance Scale: 60% - Ambulation: Reduced / unable to do hobby or some housework, significant disease / Self-Care:Occasional assist as necessary / Intake:Normal or reduced / Level of Conscious: Full or confusion    Social History:   Lives in [place], [place] with his domestic partner, Patrick Montgomery (not officially married). They have an 18yo daughter. Also has a 26yo son who runs a comic book store. Mother is living and also a source of support.    Watches church every Sunday - Patrick Montgomery. Also belongs to a baptist church.     Occupation: Retired house painter  Hobbies: Likes to race go-karts and cars. Likes to watch his nephew do this as well.     Objective     Opioid Risk Tool:    Male  Male    Family history of substance abuse      Alcohol  1  3    Illegal drugs  2  3    Rx drugs  4  4    Personal history of substance abuse      Alcohol  3  3    Illegal drugs  4  4    Rx drugs  5  5    Age between 16--45 years  1  1    History of preadolescent sexual abuse  3  0    Psychological disease      ADD, OCD, bipolar, schizophrenia  2  2    Depression  1  1       Total: Moderate risk  (<3 low risk, 4-7 moderate risk, >8 high risk)    Hematology/Oncology History Overview Note   In 03/2022, seen in ER for gross hematuria, worsening LUTS. CT bladder showing bladder mass.  In 04/2022, PSA 18. Had cysto, prostate biopsy, TURP. Prostate biopsy showed Gleason 4+5=9 (GG 5), 10/12 cores positive for cancer. TURP specimen showed mixed acinar/ductal features, cribriborm morphology.  In 05/2022, CT and bone scan showed no pelvic or distant mets  In 06/2022, ADT started with Degarelix, continued with Eligard.  In 06/2022, PSMA PET showed avidity in R inferior pubic ramus, suspicious for single bone met.  In 07/2022, abiraterone/prednisone started.  In 08/2022, RT to prostate and bone met site.     Prostate cancer (CMS-HCC)   06/17/2022 Initial Diagnosis    Prostate cancer (CMS-HCC)     06/17/2022 -  Cancer Staged    Staging form: Prostate, AJCC 8th Edition  - Clinical: Stage IIIC (cT4, cN0, cM0, PSA: 18, Grade Group: 5) - Signed by Young E Whang, MD on 06/19/2022       07/07/2022 - 07/07/2022 Endocrine/Hormone Therapy    OP DEGARELIX  Plan Provider: Young E Whang, MD     08/11/2022 Endocrine/Hormone Therapy    OP PROSTATE LEUPROLIDE (ELIGARD) 45 MG EVERY 6 MONTHS  Plan Provider: Young E Whang, MD     10 /10/2022 -  Radiation    Radiation Therapy Treatment Details (Noted on 09/10/2022)  Site: Prostate  Technique: IMRT  Goal: No goal specified  Planned Treatment Start Date: No planned start date specified         Past Medical History:   Diagnosis Date  Anxiety     Arthritis     Basal cell carcinoma     Cancer (CMS-HCC)     skin    Gout     Kidney stones     Squamous cell skin cancer     Stroke (CMS-HCC)      Allergies: No Known Allergies    Family History:  Cancer-related family history includes Cancer in his brother and father.  He indicated that the status of his father is unknown. He indicated that the status of his brother is unknown. He indicated that the status of his neg hx is unknown.    REVIEW OF SYSTEMS:  A comprehensive review of 10 systems was negative except for pertinent positives noted in HPI.    PHYSICAL EXAM:   Vital signs for this encounter: VS reviewed in EPIC.  GEN: Chronically ill-appearing pleasant male in no acute distress  HEENT: No scleral icterus. No facial asymmetry.   CV: Warm and well-perfused  LUNGS: No increased work of breathing on room air  ABD: NT, ND  GU: no visible skin changes, TTP along perineum, most tender at R inferior pubic ramus  SKIN: No rashes  MSK: No sarcopenia,   EXT: No edema noted of the lower extremities  NEURO: No focal deficits appreciated.  PSYCH: Alert and oriented to person, place and time. Euthymic.     I personally spent 70 minutes face-to-face and non-face-to-face in the care of this patient, which includes all pre, intra, and post visit time on the date of service.  All documented time was specific to the E/M visit and does not include any procedures that may have been performed.    Cherrie Gauze MD  PGY2 Internal Medicine      I saw and evaluated the patient, participating in the key portions of the service.  I reviewed the fellow/resident???s note.  I agree with the fellow/resident???s findings and plan.    I personally spent 70 minutes face-to-face and non-face-to-face in the care of this patient, which includes all pre, intra, and post visit time on the date of service.  All documented time was specific to the E/M visit and does not include any procedures that may have been performed.    I separately spent 16 minutes in voluntary ACP services.     Redgie Grayer, MD  January 13, 2023 2:23 PM       Redgie Grayer, MD, [facility] Palliative Care

## 2023-01-12 NOTE — Unmapped (Signed)
Patient requested abiraterone refill via MyChart.  [facility] contacted patient to schedule a delivery but had to leave a voice message.    Horace Porteous, PharmD  [facility]

## 2023-01-12 NOTE — Unmapped (Signed)
[facility] Clinical Assessment & Refill Coordination Note    Patrick Montgomery, DOB: [birth year]  Phone: [phone] (home)     All above HIPAA information was verified with patient's family member, wife.     Was a Nurse, learning disability used for this call? No    Specialty Medication(s):   Hematology/Oncology: abiraterone 250 mg     Current Outpatient Medications   Medication Sig Dispense Refill    abiraterone (ZYTIGA) 250 mg tablet Take 4 tablets (1,000 mg total) by mouth daily. 120 tablet 11    acetaminophen (TYLENOL EXTRA STRENGTH) 500 MG tablet Take 2 tablets (1,000 mg total) by mouth two (2) times a day as needed for pain. 100 tablet 2    amlodipine (NORVASC) 5 MG tablet Take 1 tablet (5 mg total) by mouth daily. 90 tablet 3    amlodipine (NORVASC) 5 MG tablet Take 1 tablet (5 mg total) by mouth daily. (Patient not taking: Reported on 12/15/2022) 90 tablet 3    aspirin (ECOTRIN) 81 MG tablet Take 1 tablet (81 mg total) by mouth daily. 90 tablet 3    atorvastatin (LIPITOR) 10 MG tablet Take 1 tablet (10 mg total) by mouth daily. (Patient not taking: Reported on 12/15/2022) 90 tablet 3    atorvastatin (LIPITOR) 10 MG tablet Take 1 tablet (10 mg total) by mouth daily. (Patient not taking: Reported on 12/15/2022) 90 tablet 3    atorvastatin (LIPITOR) 10 MG tablet TAKE 1 TABLET BY MOUTH EVERY DAY (Patient not taking: Reported on 12/15/2022) 90 tablet 3    cyclobenzaprine (FLEXERIL) 10 MG tablet Take 1 tablet (10 mg total) by mouth nightly as needed for muscle spasms. (Patient not taking: Reported on 12/15/2022) 30 tablet 0    lidocaine (XYLOCAINE) 5 % ointment Apply to affected area daily 37 g 0    naloxone (NARCAN) 4 mg nasal spray One spray in either nostril once for known/suspected opioid overdose. May repeat every 2-3 minutes in alternating nostril til EMS arrives 2 each 3    naproxen (NAPROSYN) 500 MG tablet Take 1 tablet (500 mg total) by mouth in the morning and 1 tablet (500 mg total) in the evening. Take with meals. 20 tablet 0    oxybutynin (DITROPAN) 5 MG tablet Take 1 tablet (5 mg total) by mouth Three (3) times a day. (Patient not taking: Reported on 12/15/2022) 90 tablet 11    oxyCODONE (ROXICODONE) 5 MG immediate release tablet Take 1 tablet (5 mg total) by mouth every four (4) hours as needed for pain. 84 tablet 0    polyethylene glycol (MIRALAX) 17 gram packet Take 17 g by mouth two (2) times a day. 100 packet 2    predniSONE (DELTASONE) 5 MG tablet Take 1 tablet (5 mg total) by mouth daily. (Patient not taking: Reported on 12/15/2022) 30 tablet 11    predniSONE (DELTASONE) 5 MG tablet Take 1 tablet (5 mg total) by mouth daily. 14 tablet 0    SENNA 8.6 mg tablet Take 2 tablets by mouth daily. 30 tablet 5    tamsulosin (FLOMAX) 0.4 mg capsule Take 1 capsule (0.4 mg total) by mouth daily. 30 capsule 11    tamsulosin (FLOMAX) 0.4 mg capsule Take 1 capsule (0.4 mg total) by mouth daily. 30 capsule 11     No current facility-administered medications for this visit.        Changes to medications: Sorren Reports stopping the following medications: tamsulosin    No Known Allergies  Changes to allergies: No    SPECIALTY MEDICATION ADHERENCE     abiraterone 250 mg: 0 days of medicine on hand     Medication Adherence    Patient reported X missed doses in the last month: 0  Specialty Medication: Abiraterone 250 mg - 4 tabs (1,000 mg) once daily  Patient is on additional specialty medications: No  Informant: spouse  Confirmed plan for next specialty medication refill: delivery by pharmacy  Refills needed for supportive medications: not needed          Specialty medication(s) dose(s) confirmed: Regimen is correct and unchanged.     Are there any concerns with adherence? No    Adherence counseling provided? Not needed    CLINICAL MANAGEMENT AND INTERVENTION      Clinical Benefit Assessment:    Do you feel the medicine is effective or helping your condition? Yes    Clinical Benefit counseling provided? Not needed    Adverse Effects Assessment:    Are you experiencing any side effects?  None reported    Are you experiencing difficulty administering your medicine? No    Quality of Life Assessment:    Quality of Life    Rheumatology  Oncology  1. What impact has your specialty medication had on the reduction of your daily pain or discomfort level?: None  2. On a scale of 1-10, how would you rate your ability to manage side effects associated with your specialty medication? (1=no issues, 10 = unable to take medication due to side effects): 1  Dermatology  Cystic Fibrosis          How many days over the past month did your prostate cancer  keep you from your normal activities? For example, brushing your teeth or getting up in the morning. 0    Have you discussed this with your provider? Not needed    Acute Infection Status:    Acute infections noted within Epic:  No active infections  Patient reported infection: None    Therapy Appropriateness:    Is therapy appropriate and patient progressing towards therapeutic goals? Yes, therapy is appropriate and should be continued    DISEASE/MEDICATION-SPECIFIC INFORMATION      N/A    Oncology: Is the patient receiving adequate infection prevention treatment? Not applicable  Does the patient have adequate nutritional support? Not applicable    PATIENT SPECIFIC NEEDS     Does the patient have any physical, cognitive, or cultural barriers? No    Is the patient high risk? No    Did the patient require a clinical intervention? No    Does the patient require physician intervention or other additional services (i.e., nutrition, smoking cessation, social work)? No    SOCIAL DETERMINANTS OF HEALTH     At the [facility], we have learned that life circumstances - like trouble affording food, housing, utilities, or transportation can affect the health of many of our patients.   That is why we wanted to ask: are you currently experiencing any life circumstances that are negatively impacting your health and/or quality of life? Patient declined to answer    Social Determinants of Health     Financial Resource Strain: Low Risk  (05/06/2022)    Overall Financial Resource Strain (CARDIA)     Difficulty of Paying Living Expenses: Not hard at all   Internet Connectivity: No Internet connectivity concern identified (05/06/2022)    Internet Connectivity     Do you have access to internet services: Yes  How do you connect to the internet: Personal Device at home     Is your internet connection strong enough for you to watch video on your device without major problems?: Yes     Do you have enough data to get through the month?: Yes     Does at least one of the devices have a camera that you can use for video chat?: Yes   Food Insecurity: No Food Insecurity (05/06/2022)    Hunger Vital Sign     Worried About Running Out of Food in the Last Year: Never true     Ran Out of Food in the Last Year: Never true   Tobacco Use: High Risk (12/15/2022)    Patient History     Smoking Tobacco Use: Every Day     Smokeless Tobacco Use: Never     Passive Exposure: Not on file   Housing/Utilities: Low Risk  (05/06/2022)    Housing/Utilities     Within the past 12 months, have you ever stayed: outside, in a car, in a tent, in an overnight shelter, or temporarily in someone else's home (i.e. couch-surfing)?: No     Are you worried about losing your housing?: No     Within the past 12 months, have you been unable to get utilities (heat, electricity) when it was really needed?: No   Alcohol Use: Not on file   Transportation Needs: No Transportation Needs (05/06/2022)    PRAPARE - Transportation     Lack of Transportation (Medical): No     Lack of Transportation (Non-Medical): No   Substance Use: Not on file   Health Literacy: Not on file   Physical Activity: Not on file   Interpersonal Safety: Not at risk (05/06/2022)    Interpersonal Safety     Unsafe Where You Currently Live: No     Physically Hurt by Anyone: No     Abused by Anyone: No   Stress: Not on file   Intimate Partner Violence: Not At Risk (05/06/2022)    Humiliation, Afraid, Rape, and Kick questionnaire     Fear of Current or Ex-Partner: No     Emotionally Abused: No     Physically Abused: No     Sexually Abused: No   Depression: Not at risk (06/22/2022)    PHQ-2     PHQ-2 Score: 0   Social Connections: Not on file     Would you be willing to receive help with any of the needs that you have identified today? Not applicable       SHIPPING     Specialty Medication(s) to be Shipped:   Hematology/Oncology: abiraterone 250 mg    Other medication(s) to be shipped:  prednisone     Changes to insurance: No    Patient was informed of new phone menu: No    Delivery Scheduled: Yes, Expected medication delivery date: 01/13/23.     Medication will be delivered via Same Day Courier to the confirmed prescription address in [facility].    The patient will receive a drug information handout for each medication shipped and additional FDA Medication Guides as required.  Verified that patient has previously received a Conservation officer, historic buildings and a Surveyor, mining.    The patient or caregiver noted above participated in the development of this care plan and knows that they can request review of or adjustments to the care plan at any time.      All of the patient's questions and  concerns have been addressed.    Kermit Balo, [facility]   [facility] Specialty Pharmacist

## 2023-01-13 MED FILL — PREDNISONE 5 MG TABLET: ORAL | 30 days supply | Qty: 30 | Fill #6

## 2023-01-13 MED FILL — ABIRATERONE 250 MG TABLET: ORAL | 30 days supply | Qty: 120 | Fill #6

## 2023-01-14 ENCOUNTER — Ambulatory Visit: Admit: 2023-01-14 | Payer: PRIVATE HEALTH INSURANCE | Attending: Radiation Oncology | Primary: Radiation Oncology

## 2023-01-14 DIAGNOSIS — Z510 Encounter for antineoplastic radiation therapy: Principal | ICD-10-CM

## 2023-01-14 DIAGNOSIS — C61 Malignant neoplasm of prostate: Principal | ICD-10-CM

## 2023-01-14 DIAGNOSIS — F17210 Nicotine dependence, cigarettes, uncomplicated: Principal | ICD-10-CM

## 2023-01-14 NOTE — Unmapped (Unsigned)
Patient here for follow up today.     Denies any burning/pain with urination. Denies urgency/frequency.     Having issues with constipation, but taking Miralax and senna.    Appetite okay.     Good energy level. Able to do what he wants.

## 2023-01-15 NOTE — Unmapped (Signed)
Addended by: Rico Ala on: 01/15/2023 01:32 PM     Modules accepted: Orders

## 2023-01-15 NOTE — Unmapped (Signed)
RADIATION ONCOLOGY FOLLOW-UP VISIT NOTE     Encounter Date: 01/14/2023  Patient Name: Patrick Montgomery  Medical Record Number: [id number]    DIAGNOSIS:  62yo with high risk prostate cancer (PSA 18, Gleason 4+5 in 10/12 cores) with extension into the bladder neck and a solitary PSMA PET-avid lesion in the R pubic ramus concerning for oligometastatic disease.  He was treated with definitive RT (70Gy/74fx) finished 10/2022.    DURATION SINCE COMPLETION OF RADIOTHERAPY:  2 months (11/10/2022)    ASSESSMENT:  Disease Status: Treatment incomplete    RECOMMENDATIONS:  FOLLOW-UP:  I will see back in 1 month to reassess progress  GU:  Still having LUTS and pelvic pain (see below).  He had bladder neck involvement at diagnosis and will undergo repeat cystoscopy with Dr. Laural Benes next month to re-evaluate.  GI:  Continues to have constipation (likely from narcotic pain medication usage).  This is improving a little- now taking a bowel regimen intermittently  Pain:  This has been an ongoing issue predating radiation treatments.  The exact etiology is unclear and CT from today does not show any new findings from prior.  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  Unfortunately the met was treated with his recent RT treatment and pain did not improve.  I also wonder if some of his pain issues are related to his bladder neck invasion/prior TURP.  Cystoscopy scheduled in a few weeks to evaluate.  He is now following with palliative care team and continues on oxycodone, naproxen.    INTERVAL HISTORY:      Doing OK today.  Pain is still his biggest issue and is unchanged from prior.  He saw the palliative care team who continued oxycodone and added in NSAIDs, topical lidocaine, and increased tylenol dose.  No change yet.  Urination issues are the same as before- he has frequency, urgency, and nocturia 3-4x.  He reports dysuria but this is moreso his pelvic pain than true pain with urination.  Still having issues with constipation- taking senna at the recommendation of the palliative care team but did not start doing miralax yet.  Reviewed his bowel regimen again.      REVIEW OF SYSTEMS:  A comprehensive review of 10 systems was negative except for pertinent positives noted in HPI.    PAST MEDICAL HISTORY/FAMILY HISTORY/SOCIAL HISTORY:  Reviewed in EPIC    ALLERGIES/MEDICATIONS:  Reviewed in EPIC    PHYSICAL EXAM:  Vital Signs for this encounter:   BP 120/87  - Pulse 79  - Temp 36.1 ??C (97 ??F) (Temporal)  - Resp 18  - Wt 64 kg (141 lb)  - SpO2 96%  - BMI 22.76 kg/m??   Karnofsky/Lansky Performance Status: 80, Normal activity with effort; some signs or symptoms of disease (ECOG equivalent 1)  General:   No acute distress, alert and oriented X 4   Psychiatric:  Normal mood and affect.  Converses clearly and emotionally appropriate.   HEENT:  EOMI, MMM  Cardio: RRR, no m/r/g  Respiratory: LCTAB, no w/r/r  Abdomen: Soft, non-tender, non-distended. Normal active bowel sounds  Neuro: CN III-XII intact grossly, AAOx3  Extremities:  No clubbing cyanosis, or edema    RADIOLOGY:   CT A/P 12/15/2022 personally reviewed  --Mild rectosigmoid wall thickening without surrounding inflammatory changes, favored secondary to radiation changes, with additional imaging findings suggestive of slow bowel transit.      --  Increased conspicuity of right inferior pubic ramus mixed lytic sclerotic lesion which may reflect posttreatment changes versus worsening site of disease. No new osseous metastases.         Labs:    Lab Results   Component Value Date    AST 21 01/07/2023    ALT 22 01/07/2023       Rayetta Humphrey, MD  Assistant Professor  [facility] Dept of Radiation Oncology  01/15/2023

## 2023-01-21 NOTE — Unmapped (Signed)
Procedure:  Local cystourethroscopy    Code: Cystoscopy, CPT 52000      Indication: 62 y.o. male here for  ongoing LUTS and pelvic pain from locally advanced and oligometastatic prostate cancer for which he is undergoing XRT with Dr. Carles Collet . He has GGG5 disease with extension into his bladder neck and median lobe. He underwent palliative TURP for local invasion at the time of his TRUS biopsy in the OR. Standard staging imaging was negative, but PSMA PET subsequently showed a possible solitary bone lesion in the right inferior pubic ramus. He is seeing Dr. Carles Collet for XRT and the palliative care team.     He continues to experience pelvic pain, not really associated with urination. He describes it as where the right side of his urethra meets his rectum. He had a cystoscopy in 04/2022 prior to his diagnosis that was extremely painful and poorly tolerated, followed by a TURP, prostate biopsy, and XRT.    Description:   Time out was performed immediately prior to the procedure.     The patient was prepped and draped in the usual sterile fashion in the relaxed dorsal lithotomy position. Flexible cystoscopy was performed. The urethral meatus and urethra were normal up to the membranous urethra, where there was some lumen narrowing with decreased vascularity. The scope was able to traverse this area with some pressure, but this was not painful for the patient. There was no ratty, friable, bleeding tissue representing active, invasive cancer as in the prior cystoscopy. The bladder urothelium was normal. On retroflexion, the prostate protruded slightly into the bladder circumferentially, but there was no malignant invasive or obstruction.  Ureteral orifices were visualized bilaterally with clear efflux. Saline barbotage was not performed as recommended by AUA guidelines.. The patient tolerated the procedure well, and he did not require prophylactic antibiotics according to AUA guidelines.Marland Kitchen    Specimen: None    Assessment:  Much improved from prior cystoscopies. Locally advanced disease into the bladder neck and prostatic urethra seems to have regressed significantly.     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 This is not surprising given his pain is constant and not associated with urination.   Recommend ongoing pain management with palliative care

## 2023-01-22 NOTE — Unmapped (Unsigned)
OUTPATIENT ONCOLOGY PALLIATIVE CARE  OPIOID SAFETY VISIT    Principal Diagnosis: Patrick Montgomery is a 62 y.o. male living with prostate cancer, diagnosed in June 2023 now on ADT with Eliguard.  Disease sites include prostate, ?solitary bone metastasis. .     Last seen by Redgie Grayer on 01/11/23 (In Person)  This is a Virtual visit.    Interval History Updates:  General: ***  Pain: ***  Nausea: ***  Bowel function: ***  Other symptoms: ***    #Cancer-Related Pain: Severe.  Experiencing severe pain in the right perineal area.  Unclear if cancer related although this does seem to correlate with CT finding of increased conspicuity of right inferior pubic ramus mixed lytic sclerotic lesion.  Unclear if pain is from this point disease versus radiation changes versus to what degree his severe constipation is contributing.  May also have neuropathic component given reported intermittent radiating pain down right leg and up his back.  Prior Medications: MS contin - didn't find effective.  Oxycodone 5 mg effective.  - Trial naproxen 500mg  BID x 10 days  - Increase tylenol to 1000 mg BID  - Continue oxycodone 5 mg every 4 hours PRN with goal of eventual wean  - Trial Lidocaine ointment to tender area of skin  - Consider reimaging of pelvis/perineum if pain worsens or persist     #Cancer-Related Fatigue: Stable. STill able to be active.   - CTM  - Encouraged light exercise as able      #Constipation: Worse.  Reports hard, pebble-like bowel movements.  Has been requiring milk of magnesia to have a good bowel movement, and has not had 1 for at least several days.  - Start senna 2 tablets nightly  - Start miralax BID  - Low threshold to escalate regimen vs. Start naloxegol  Changes to plan:  ***  ----------------------------------------    Assessment/Plan:   1. Pain Agreement. Reviewed the Outpatient Oncology Palliative Care [facility]) patient-provider agreement for opioid medications. Discussed that patient's provider has decided that they are a good candidate for opioid medicine because of the potential benefits to decrease pain and/or improve daily functioning. However, these medication also have side effects. Our goal is to prioritize the patient's safety, and the purpose of this agreement is to help patients and providers work together to increase benefits and reduce risks of opioid medicines.  Key points include:   - You agree to only receive opioids from the care team with whom you have this agreement   - Take prescribed opioid medicines as directed. Do not change the dose or take medicine more often than instructed with communicating with a member of your care team.    - If you have changes in your symptoms such as worsening pain, reach out to palliative nurse coordinator [phone]), palliative care pharmacist [phone]), or provider by MyChart for instructions on how to manage your symptoms.     2. Side effects of Opioids. Reviewed the possible side effects of opioids, including:   - Common side effects: Fatigue, drowsiness, itching - typically, these symptoms will decrease or disappear over time as your body adjusts to the medication  - Constipation: This is a side effect that your body does NOT adjust to. It is important to make sure you are having regular bowel movements (at least once every other day without straining). Your team may prescribe laxative medications to prevent or treat constipation.   - Effects of taking too much medicine: respiratory depression (breathing slows or  stops), loss of consciousness (fainting or passing out)    3. Adherence and Communication.   - Take prescribed opioid medicines as directed. Do not change the dose or take medicine more often than instructed without communicating with a member of your care team.   - Cost barriers: Many pain medications will require your team to file additional paperwork to cover the cost when starting a new medication or increasing the dose.   - Your team members will work with your pharmacy to file any necessary forms for coverage  - These medications are also tightly regulated and often may not be picked up from the pharmacy early.  - Please communicate with your team when having new or worsening symptoms to avoid delays in receiving medications.     4. Storage and Administration.    - Store pain medications in a safe place out of reach of pets, children, or others adults***    5. Risk Mitigation   - Urine Drug Screens: For your safety, your team members may request you provide a urine drug sample. ***   - Naloxone: This agent may be prescribed to you as a safety precaution to reverse the effects of opioids.       # Controlled substances risk management.   - Patient {Blank single:19197::has a signed pain medication agreement with Outpatient Palliative Care, completed on ***, as per standard of care,does not have a signed pain medication agreement with our team}.   - [place] database was reviewed today and it {Blank single:19197::was,was not} appropriate.   - Urine drug screen was not performed at this visit. Findings: not applicable.   - Patient has received information about safe storage and administration of medications.   - Patient has received a prescription for narcan; is not applicable. Rx sent 01/11/2023.      F/u: ***    ----------------------------------------  Referring Provider: ***  Oncology Team: ***  PCP: Deneise Lever, FNP        Objective     Opioid Risk Tool:    Male  Male    Family history of substance abuse      Alcohol  1  3    Illegal drugs  2  3    Rx drugs  4  4    Personal history of substance abuse      Alcohol  3  3    Illegal drugs  4  4    Rx drugs  5  5    Age between 16--45 years  1  1    History of preadolescent sexual abuse  3  0    Psychological disease      ADD, OCD, bipolar, schizophrenia  2  2    Depression  1  1       Total: ***  (<3 low risk, 4-7 moderate risk, >8 high risk)        Time spent with patient was *** minutes. Additional *** minutes were spent on preparation, documentation and coordinating care.      {Blank single:19197::Cynthia Avie Arenas, MD,Julie Merlene Morse, MD PhD,Sean Ike Bene, MD,Kenzie Garner Nash, MD,Jacobi Nile Smith Robert, PharmD CPP,Lauren Baumgardner, DO, HPM Fellow,Clarissa Durand-Rougely, MD, HPM Kerin Ransom, MD, HPM Edilia Bo, MD, HPM Fellow,***}  [facility] Outpatient Oncology Palliative Care

## 2023-01-28 ENCOUNTER — Ambulatory Visit: Admit: 2023-01-28 | Discharge: 2023-01-28 | Payer: PRIVATE HEALTH INSURANCE | Attending: Urology | Primary: Urology

## 2023-01-28 ENCOUNTER — Ambulatory Visit: Admit: 2023-01-28 | Discharge: 2023-01-28 | Payer: PRIVATE HEALTH INSURANCE

## 2023-01-28 DIAGNOSIS — R310 Gross hematuria: Principal | ICD-10-CM

## 2023-01-28 NOTE — Unmapped (Unsigned)
OUTPATIENT ONCOLOGY PALLIATIVE CARE  OPIOID SAFETY VISIT    Principal Diagnosis: Mr. Patrick Montgomery is a 62 y.o. male living with prostate cancer, diagnosed in June 2023 now on ADT with Eliguard.  Disease sites include prostate, ?solitary bone metastasis. .     Last seen by Redgie Grayer on 01/11/23 (In Person)  This is a Virtual visit.    Interval History Updates:  General: ***  Pain: ***  Nausea: ***  Bowel function: ***  Other symptoms: ***    #Cancer-Related Pain: Severe.  Experiencing severe pain in the right perineal area.  Unclear if cancer related although this does seem to correlate with CT finding of increased conspicuity of right inferior pubic ramus mixed lytic sclerotic lesion.  Unclear if pain is from this point disease versus radiation changes versus to what degree his severe constipation is contributing.  May also have neuropathic component given reported intermittent radiating pain down right leg and up his back.  Prior Medications: MS contin - didn't find effective.  Oxycodone 5 mg effective.  - Trial naproxen 500mg  BID x 10 days  - Increase tylenol to 1000 mg BID  - Continue oxycodone 5 mg every 4 hours PRN with goal of eventual wean  - Trial Lidocaine ointment to tender area of skin  - Consider reimaging of pelvis/perineum if pain worsens or persist     #Cancer-Related Fatigue: Stable. STill able to be active.   - CTM  - Encouraged light exercise as able      #Constipation: Worse.  Reports hard, pebble-like bowel movements.  Has been requiring milk of magnesia to have a good bowel movement, and has not had 1 for at least several days.  - Start senna 2 tablets nightly  - Start miralax BID  - Low threshold to escalate regimen vs. Start naloxegol  Changes to plan:  ***  ----------------------------------------    Assessment/Plan:   1. Pain Agreement. Reviewed the Outpatient Oncology Palliative Care [facility]) patient-provider agreement for opioid medications. Discussed that patient's provider has decided that they are a good candidate for opioid medicine because of the potential benefits to decrease pain and/or improve daily functioning. However, these medication also have side effects. Our goal is to prioritize the patient's safety, and the purpose of this agreement is to help patients and providers work together to increase benefits and reduce risks of opioid medicines.  Key points include:   - You agree to only receive opioids from the care team with whom you have this agreement   - Take prescribed opioid medicines as directed. Do not change the dose or take medicine more often than instructed with communicating with a member of your care team.    - If you have changes in your symptoms such as worsening pain, reach out to palliative nurse coordinator [phone]), palliative care pharmacist [phone]), or provider by MyChart for instructions on how to manage your symptoms.     2. Side effects of Opioids. Reviewed the possible side effects of opioids, including:   - Common side effects: Fatigue, drowsiness, itching - typically, these symptoms will decrease or disappear over time as your body adjusts to the medication  - Constipation: This is a side effect that your body does NOT adjust to. It is important to make sure you are having regular bowel movements (at least once every other day without straining). Your team may prescribe laxative medications to prevent or treat constipation.   - Effects of taking too much medicine: respiratory depression (breathing slows or  stops), loss of consciousness (fainting or passing out)    3. Adherence and Communication.   - Take prescribed opioid medicines as directed. Do not change the dose or take medicine more often than instructed without communicating with a member of your care team.   - Cost barriers: Many pain medications will require your team to file additional paperwork to cover the cost when starting a new medication or increasing the dose.   - Your team members will work with your pharmacy to file any necessary forms for coverage  - These medications are also tightly regulated and often may not be picked up from the pharmacy early.  - Please communicate with your team when having new or worsening symptoms to avoid delays in receiving medications.     4. Storage and Administration.    - Store pain medications in a safe place out of reach of pets, children, or others adults***    5. Risk Mitigation   - Urine Drug Screens: For your safety, your team members may request you provide a urine drug sample. ***   - Naloxone: This agent may be prescribed to you as a safety precaution to reverse the effects of opioids.       # Controlled substances risk management.   - Patient {Blank single:19197::has a signed pain medication agreement with Outpatient Palliative Care, completed on ***, as per standard of care,does not have a signed pain medication agreement with our team}.   - [place]CC[place] database was reviewed today and it {Blank single:19197::was,was not} appropriate.   - Urine drug screen was not performed at this visit. Findings: not applicable.   - Patient has received information about safe storage and administration of medications.   - Patient has received a prescription for narcan; is not applicable. Rx sent 01/11/2023.      F/u: ***    ----------------------------------------  Referring Provider: ***  Oncology Team: ***  PCP: Deneise Lever, FNP        Objective     Opioid Risk Tool:    Male  Male    Family history of substance abuse      Alcohol  1  3    Illegal drugs  2  3    Rx drugs  4  4    Personal history of substance abuse      Alcohol  3  3    Illegal drugs  4  4    Rx drugs  5  5    Age between 16--45 years  1  1    History of preadolescent sexual abuse  3  0    Psychological disease      ADD, OCD, bipolar, schizophrenia  2  2    Depression  1  1       Total: ***  (<3 low risk, 4-7 moderate risk, >8 high risk)        Time spent with patient was *** minutes. Additional *** minutes were spent on preparation, documentation and coordinating care.      {Blank single:19197::Cynthia Avie Arenas, MD,Julie Merlene Morse, MD PhD,Sean Ike Bene, MD,Kenzie Garner Nash, MD,Korion Cuevas Smith Robert, PharmD CPP,Lauren Baumgardner, DO, HPM Fellow,Clarissa Durand-Rougely, MD, HPM Kerin Ransom, MD, HPM Edilia Bo, MD, HPM Fellow,***}  [facility] Outpatient Oncology Palliative Care

## 2023-01-29 DIAGNOSIS — C61 Malignant neoplasm of prostate: Principal | ICD-10-CM

## 2023-01-29 MED ORDER — OXYCODONE 5 MG TABLET
ORAL_TABLET | ORAL | 0 refills | 14 days | Status: CP | PRN
Start: 2023-01-29 — End: ?

## 2023-01-29 NOTE — Unmapped (Signed)
OUTPATIENT ONCOLOGY PALLIATIVE CARE  OPIOID SAFETY VISIT    Principal Diagnosis: Patrick Montgomery is a 62 y.o. male living with prostate cancer, diagnosed in June 2023 now on ADT with Eliguard.  Disease sites include prostate, ?solitary bone metastasis. .     Last seen by Redgie Grayer on 01/11/23 (In Person)  This is a Virtual visit.    Interval History Updates:  General: Doing okay; has radiation appointment with Dr. Carles Collet next Thursday that he is looking forward to. Other than pain, feels he would be doing very well.   Pain: Has not improved at all since last visit. Continues to take oxycodone 5 mg every 4 hours as needed (averages 4-6 doses per day but more often takes all 6 doses). He does feel this dose eases pain off but doesn't take it away totally. Allows him to get up and do things. Feels it is relatively manageable as long as he can take short-acting oxycodone. Shares he does not want any stronger pain medicine (previously trialed MS Contin but did not find effective and did not want to continue).    He is out of oxycodone and requests a refill. Last filled #84 tabs/14 days on 2/12 per PDMP. Refill request appropriate.    Changes to plan:  None today - sent oxycodone refill as [facility] to [facility] [place]  ----------------------------------------    Assessment/Plan:   1. Pain Agreement. Reviewed the Outpatient Oncology Palliative Care [facility]) patient-provider agreement for opioid medications. Discussed that patient's provider has decided that they are a good candidate for opioid medicine because of the potential benefits to decrease pain and/or improve daily functioning. However, these medication also have side effects. Our goal is to prioritize the patient's safety, and the purpose of this agreement is to help patients and providers work together to increase benefits and reduce risks of opioid medicines.  Key points include:   - You agree to only receive opioids from the care team with whom you have this agreement   - Take prescribed opioid medicines as directed. Do not change the dose or take medicine more often than instructed with communicating with a member of your care team.    - If you have changes in your symptoms such as worsening pain, reach out to palliative nurse coordinator [phone]), palliative care pharmacist [phone]), or provider by MyChart for instructions on how to manage your symptoms.     2. Side effects of Opioids. Reviewed the possible side effects of opioids, including:   - Common side effects: Fatigue, drowsiness, itching - typically, these symptoms will decrease or disappear over time as your body adjusts to the medication  - Constipation: This is a side effect that your body does NOT adjust to. It is important to make sure you are having regular bowel movements (at least once every other day without straining). Your team may prescribe laxative medications to prevent or treat constipation.   - Effects of taking too much medicine: respiratory depression (breathing slows or stops), loss of consciousness (fainting or passing out)    3. Adherence and Communication.   - Take prescribed opioid medicines as directed. Do not change the dose or take medicine more often than instructed without communicating with a member of your care team.   - Cost barriers: Many pain medications will require your team to file additional paperwork to cover the cost when starting a new medication or increasing the dose.   - Your team members will work with your pharmacy to file any  necessary forms for coverage  - These medications are also tightly regulated and often may not be picked up from the pharmacy early.  - Please communicate with your team when having new or worsening symptoms to avoid delays in receiving medications.     4. Storage and Administration.    - Store pain medications in a safe place out of reach of pets, children, or others adults. Verifies he stores medications in a safe place.    5. Risk Mitigation   - Urine Drug Screens: For your safety, your team members may request you provide a urine drug sample. Agreeable to providing at future visit. Shares we would except to see marijuana in urine.   - Naloxone: This agent may be prescribed to you as a safety precaution to reverse the effects of opioids.       # Controlled substances risk management.   - Patient does not have a signed pain medication agreement with our team. Agreement sent via MyChart; awaiting acknowledgement   - NCCSRS database was reviewed today and it was appropriate.   - Urine drug screen was not performed at this visit. Findings: not applicable.   - Patient has received information about safe storage and administration of medications.   - Patient has received a prescription for narcan; is not applicable. Rx sent 01/11/2023.      F/u: 3/19 with Dr. Ike Bene    ----------------------------------------  PCP: Deneise Lever, FNP        Objective     Opioid Risk Tool:    Male  Male    Family history of substance abuse      Alcohol  1  3    Illegal drugs  2  3    Rx drugs  4  4    Personal history of substance abuse      Alcohol  3  3    Illegal drugs  4  4    Rx drugs  5  5    Age between 16--45 years  1  1    History of preadolescent sexual abuse  3  0    Psychological disease      ADD, OCD, bipolar, schizophrenia  2  2    Depression  1  1       Total: 8  (<3 low risk, 4-7 moderate risk, >8 high risk)    Has shared with Dr. Ike Bene previously that int he past he used crack cocaine regularly, but quit 17 years ago when daughter was born and has not looked back since.  Shared today that he lost his father to substance use disorder and overdose on prescription pain medications. Also shares that he has had multiple friends with substance use disorder.  Shares that he understands the risks of pain medications and is wary of being hooked on pain medication; his goal is to minimize his risk as possible as he has seen firsthand the effects.    Time spent with patient was 16 min minutes.  Additional 30 minutes were spent on preparation, documentation and coordinating care.      Hector Shade, PharmD CPP  [facility] Outpatient Oncology Palliative Care

## 2023-02-01 MED ADMIN — lidocaine 2% gel (XYLOCAINE) jelly urojet 20 mL: 20 mL | URETHRAL | @ 16:00:00 | Stop: 2023-02-01

## 2023-02-01 MED ADMIN — sodium chloride irrigation (NS) 0.9 % irrigation solution 500 mL: 500 mL | @ 16:00:00 | Stop: 2023-02-01

## 2023-02-01 NOTE — Unmapped (Signed)
Addended by: Demetrius Revel F on: 02/01/2023 10:53 AM     Modules accepted: Orders

## 2023-02-03 NOTE — Unmapped (Signed)
I spoke with patient Patrick Montgomery to confirm appointments on the following date(s): from 3/11 to 3/14    [facility]

## 2023-02-04 ENCOUNTER — Ambulatory Visit: Admit: 2023-02-04 | Payer: PRIVATE HEALTH INSURANCE | Attending: Radiation Oncology | Primary: Radiation Oncology

## 2023-02-04 DIAGNOSIS — C61 Malignant neoplasm of prostate: Principal | ICD-10-CM

## 2023-02-04 NOTE — Unmapped (Signed)
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  He was treated with definitive RT (70Gy/68fx) finished 10/2022.    DURATION SINCE COMPLETION OF RADIOTHERAPY:  3 months (11/10/2022)    ASSESSMENT:  Disease Status: Treatment incomplete    RECOMMENDATIONS:  FOLLOW-UP:  Med onc next week.  I will see back in 6 months  GU:  LUTS improving.  Had cysto recently showing no concerning findings  GI:  Continues to have constipation (likely from narcotic pain medication usage).  Still not routinely taking bowel regimen- reviewed the importance of this again and he will take miralax when he gets home  Pain:  This has been an ongoing issue predating radiation treatments.  I suspect that his pain may be related to his underlying met to the R pubic ramus or potentially an underlying fracture.  Unfortunately the met was treated with his recent RT treatment and pain did not improve.  He is now following with palliative care team and continues on oxycodone, naproxen.    INTERVAL HISTORY:      Feels like he is doing better today than a few weeks ago- his wife is with him today and agrees that he is looking better than a few weeks ago.  Since last seen he has established with the palliative care team and continues to manage his pain with prn oxycodone and NSAIDs.  He says he is learning to live with the pain although wife subjectively feels like his pain is improving.  Still having issues with constipation and intermittently taking bowel meds- hasn't taken miralax recently but will do that when he gets home.  Also had a cystoscopy with Dr. Laural Benes recently with no evidence of tumor, perforation.    REVIEW OF SYSTEMS:  A comprehensive review of 10 systems was negative except for pertinent positives noted in HPI.    PAST MEDICAL HISTORY/FAMILY HISTORY/SOCIAL HISTORY:  Reviewed in EPIC    ALLERGIES/MEDICATIONS:  Reviewed in EPIC    PHYSICAL EXAM:  Vital Signs for this encounter:   BP 112/74  - Pulse 62  - Temp 36.3 ??C (97.4 ??F) (Temporal)  - Resp 17  - Wt 63.3 kg (139 lb 8 oz)  - SpO2 97%  - BMI 22.52 kg/m??   Karnofsky/Lansky Performance Status: 80, Normal activity with effort; some signs or symptoms of disease (ECOG equivalent 1)  General:   No acute distress, alert and oriented X 4   Psychiatric:  Normal mood and affect.  Converses clearly and emotionally appropriate.   HEENT:  EOMI, MMM  Cardio: RRR, no m/r/g  Respiratory: LCTAB, no w/r/r  Abdomen: Soft, non-tender, non-distended. Normal active bowel sounds  Neuro: CN III-XII intact grossly, AAOx3  Extremities:  No clubbing cyanosis, or edema    RADIOLOGY:   CT A/P 12/15/2022 personally reviewed  --Mild rectosigmoid wall thickening without surrounding inflammatory changes, favored secondary to radiation changes, with additional imaging findings suggestive of slow bowel transit.      --Increased conspicuity of right inferior pubic ramus mixed lytic sclerotic lesion which may reflect posttreatment changes versus worsening site of disease. No new osseous metastases.         Labs:    No results  found for: WBC, HGB, HCT, PLT, LDH, CREATININE, AST, ALT, MG      Rayetta Humphrey, MD  Assistant Professor  [facility] Dept of Radiation Oncology  02/04/2023

## 2023-02-04 NOTE — Unmapped (Signed)
Pt continues to have issues with constipation - pt taking Senna daily. Discussed incorporating Miralax into bowel regimen. Very frequent urgency to move bowels. Occasional abdominal tenderness/pain. No bleeding in a while. Some mucus. Occasional abdominal cramping.     Urinary flow is very slow. Urinates more than x3 nightly and x9-12 daily. No pain and burning.  Very frequent urgency. Occasional leakage. Pt taking Flomax once daily.     No nausea.

## 2023-02-05 DIAGNOSIS — C61 Malignant neoplasm of prostate: Principal | ICD-10-CM

## 2023-02-05 MED ORDER — PREDNISONE 5 MG TABLET
ORAL_TABLET | Freq: Every day | ORAL | 11 refills | 30 days | Status: CP
Start: 2023-02-05 — End: ?
  Filled 2023-02-08: qty 30, 30d supply, fill #0

## 2023-02-05 NOTE — Unmapped (Signed)
[facility] Refill Coordination Note    Elry Sluyter, [place]: 1961-08-20  Phone: [phone] (home)       All above HIPAA information was verified with patient.         02/05/2023     6:48 AM   Specialty Rx Medication Refill Questionnaire   Which Medications would you like refilled and shipped? Abiraterone 1000mg  (6 days left/ Prednisone 5mg  (4 days left)   Please list all current allergies: None   Have you missed any doses in the last 30 days? No   Have you had any changes to your medication(s) since your last refill? No   How many days remaining of each medication do you have at home? 4-6   Have you experienced any side effects in the last 30 days? No   Please enter the full address (street address, city, state, zip code) where you would like your medication(s) to be delivered to. [address] [place] [address]   Please specify on which day you would like your medication(s) to arrive. Note: if you need your medication(s) within 3 days, please call the pharmacy to schedule your order at [phone]  02/08/2023   Has your insurance changed since your last refill? No   Would you like a pharmacist to call you to discuss your medication(s)? No   Do you require a signature for your package? (Note: if we are billing Medicare Part B or your order contains a controlled substance, we will require a signature) No         Completed refill call assessment today to schedule patient's medication shipment from the [facility] [phone]).  All relevant notes have been reviewed.       Confirmed patient received a Conservation officer, historic buildings and a Surveyor, mining with first shipment. The patient will receive a drug information handout for each medication shipped and additional FDA Medication Guides as required.         REFERRAL TO PHARMACIST     Referral to the pharmacist: Not needed      [facility]     Shipping address confirmed in Epic.     Delivery Scheduled: Yes, Expected medication delivery date: 02/08/23.  However, Rx request for refills was sent to the provider as there are none remaining.     Medication will be delivered via Same Day Courier to the prescription address in Epic [place].    [name] M Elisabeth Cara   [facility] Specialty Technician

## 2023-02-08 MED FILL — ABIRATERONE 250 MG TABLET: ORAL | 30 days supply | Qty: 120 | Fill #7

## 2023-02-10 DIAGNOSIS — C61 Malignant neoplasm of prostate: Principal | ICD-10-CM

## 2023-02-10 MED ORDER — OXYCODONE 5 MG TABLET
ORAL_TABLET | ORAL | 0 refills | 14 days | PRN
Start: 2023-02-10 — End: ?

## 2023-02-11 MED ORDER — OXYCODONE 5 MG TABLET
ORAL_TABLET | ORAL | 0 refills | 14 days | Status: CP | PRN
Start: 2023-02-11 — End: ?

## 2023-02-11 NOTE — Unmapped (Signed)
PDMP and chart reviewed; refill request for oxycodone appropriate. [facility]

## 2023-02-12 MED ORDER — OXYCODONE 5 MG TABLET
ORAL_TABLET | ORAL | 0 refills | 14 days | Status: CP | PRN
Start: 2023-02-12 — End: ?

## 2023-02-16 ENCOUNTER — Ambulatory Visit
Admit: 2023-02-16 | Discharge: 2023-02-16 | Payer: PRIVATE HEALTH INSURANCE | Attending: Hematology & Oncology | Primary: Hematology & Oncology

## 2023-02-16 ENCOUNTER — Ambulatory Visit
Admit: 2023-02-16 | Discharge: 2023-02-16 | Payer: PRIVATE HEALTH INSURANCE | Attending: Student in an Organized Health Care Education/Training Program | Primary: Student in an Organized Health Care Education/Training Program

## 2023-02-16 ENCOUNTER — Institutional Professional Consult (permissible substitution): Admit: 2023-02-16 | Discharge: 2023-02-16 | Payer: PRIVATE HEALTH INSURANCE

## 2023-02-16 ENCOUNTER — Other Ambulatory Visit: Admit: 2023-02-16 | Discharge: 2023-02-16 | Payer: PRIVATE HEALTH INSURANCE

## 2023-02-16 DIAGNOSIS — C61 Malignant neoplasm of prostate: Principal | ICD-10-CM

## 2023-02-16 DIAGNOSIS — Z79899 Other long term (current) drug therapy: Principal | ICD-10-CM

## 2023-02-16 DIAGNOSIS — G893 Neoplasm related pain (acute) (chronic): Principal | ICD-10-CM

## 2023-02-16 DIAGNOSIS — R399 Unspecified symptoms and signs involving the genitourinary system: Principal | ICD-10-CM

## 2023-02-16 LAB — HEPATIC FUNCTION PANEL
ALBUMIN: 3.5 g/dL (ref 3.4–5.0)
ALKALINE PHOSPHATASE: 75 U/L (ref 46–116)
ALT (SGPT): 11 U/L (ref 10–49)
AST (SGOT): 11 U/L (ref <=34)
BILIRUBIN DIRECT: 0.2 mg/dL (ref 0.00–0.30)
BILIRUBIN TOTAL: 0.4 mg/dL (ref 0.3–1.2)
PROTEIN TOTAL: 5.9 g/dL (ref 5.7–8.2)

## 2023-02-16 LAB — TOXICOLOGY SCREEN, URINE
AMPHETAMINE SCREEN URINE: NEGATIVE
BARBITURATE SCREEN URINE: NEGATIVE
BENZODIAZEPINE SCREEN, URINE: NEGATIVE
BUPRENORPHINE, URINE SCREEN: NEGATIVE
CANNABINOID SCREEN URINE: POSITIVE — AB
COCAINE(METAB.)SCREEN, URINE: NEGATIVE
FENTANYL SCREEN, URINE: NEGATIVE
METHADONE SCREEN, URINE: NEGATIVE
OPIATE SCREEN URINE: POSITIVE — AB
OXYCODONE SCREEN URINE: POSITIVE — AB

## 2023-02-16 LAB — POTASSIUM: POTASSIUM: 3.7 mmol/L (ref 3.5–5.1)

## 2023-02-16 LAB — PSA: PROSTATE SPECIFIC ANTIGEN: <0.04 ng/mL (ref 0.00–4.00)

## 2023-02-16 MED ORDER — SENNOSIDES 8.6 MG TABLET
ORAL_TABLET | Freq: Every day | ORAL | 5 refills | 30 days | Status: CP
Start: 2023-02-16 — End: ?

## 2023-02-16 MED ORDER — CYCLOBENZAPRINE 10 MG TABLET
ORAL_TABLET | Freq: Every evening | ORAL | 3 refills | 30 days | Status: CP | PRN
Start: 2023-02-16 — End: ?

## 2023-02-16 MED ADMIN — leuprolide acetate (6 month) (ELIGARD) injection 45 mg: 45 mg | SUBCUTANEOUS | @ 14:00:00 | Stop: 2023-02-16

## 2023-02-16 NOTE — Unmapped (Signed)
[facility] Visit Note    Patient Name: Patrick Montgomery  Patient Age: 62 y.o.  Encounter Date: 02/16/2023  Attending Provider:  Young E. 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    Patrick Montgomery is a 62 y.o. male who presents for management of newly diagnosed prostate cancer. He was initially seen in the ER In May 2023 for gross hematuria and  worsening LUTS. A CT scan of the bladder revealed a bladder mass. In June 2023, he was found to have a PSA of 18. He underwent cystoscopy, prostate biopsy, and TURP. Prostate biopsy showed Gleason 4+5=9 (GG 5), 10/12 cores positive for cancer. TURP specimen showed mixed acinar/ductal features, cribriborm morphology. In July 2023, CT and bone scan showed no pelvic or distant metastases.    On 06/18/22, we discussed management of his recent diagnosis of prostate cancer. Given that he has been diagnosed with prostate cancer at a very young age, I informed the patient that there may be features that indicate a higher risk of hereditary prostate cancer. 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   Based on current imaging, his cancer is confined to the prostate. A PSMA PET scan will be ordered to see if the cancer has metastasized.     On 07/07/2022, pt is now ready to move ahead with treatment. Will proceed with ADT plus abiraterone for two years, per [facility].  PSMA PET still undergoing authorization.    On 08/11/2022, most recent PSA on 08/01/2022 is 0.27. PSMA PET scan from 07/27/2022 showed avidity involving the right inferior pubic ramus which is suspicious for single site of osseous metastatic disease. Will proceed with Eligard shot and proceed with radiation with Dr. Carles Collet. May consider radiating single site of metastatic disease at the right inferior pubic ramus     On 12/03/2022, PSA <0.04, on ADT plus abi, s/p RT to all involved sites. For pain, unlikely to be from cancer, may benefit from pain clinic management long-term.    Today on 02/16/23, PSA remains <0.04. Received ADT today and will continue abiraterone/prednisone. BP mildly elevated at 146/81, will monitor. Chronic pain being addressed with support from palliative care team. Will RTC in 3 months.    Plan:  Continue ADT with Eligard, given last on 02/16/2023, due next on 08/19/2023. Anticipate 3 years of therapy (till 8-07/2025)  -- Follow PSA  Continue Abiraterone and prednisone   -- Follow labs/BP  3. NGS returned without actionable mutations, [facility] normal sent today  4. RTC 3 months    I personally spent 40 minutes face-to-face and non-face-to-face in the care of this patient, which includes all pre, intra, and post visit time on the date of service.  All documented time was specific to the E/M visit and does not include any procedures that may have been performed.  This patient was seen and discussed with Dr. Philomena Course who agrees with the findings and plan as above.    Chuck Hint, MD  Hematology & Oncology Fellow    Reason for Visit  Follow up of prostate cancer.     History of Present Illness:  Hematology/Oncology History Overview Note   In 03/2022, seen in ER for gross hematuria, worsening LUTS. CT bladder showing bladder mass.  In 04/2022, PSA 18. Had cysto, prostate biopsy, TURP. Prostate biopsy showed Gleason 4+5=9 (GG 5), 10/12 cores positive for cancer. TURP specimen showed mixed acinar/ductal features, cribriborm morphology.  In 05/2022, CT and bone scan showed no pelvic or distant mets  In 06/2022, ADT started with Degarelix, continued with Eligard.  In 06/2022, PSMA PET showed avidity in R inferior pubic ramus, suspicious for single bone met.  In 07/2022, abiraterone/prednisone started.  In 08/2022, RT to prostate and bone met site.     Prostate cancer (CMS-HCC)   06/17/2022 Initial Diagnosis    Prostate cancer (CMS-HCC)     06/17/2022 -  Cancer Staged    Staging form: Prostate, AJCC 8th Edition  - Clinical: Stage IIIC (cT4, cN0, cM0, PSA: 18, Grade Group: 5) - Signed by Maurie Boettcher, MD on 06/19/2022       07/07/2022 - 07/07/2022 Endocrine/Hormone Therapy    OP DEGARELIX  Plan Provider: Maurie Boettcher, MD     08/11/2022 Endocrine/Hormone Therapy    OP PROSTATE LEUPROLIDE (ELIGARD) 45 MG EVERY 6 MONTHS  Plan Provider: Maurie Boettcher, MD     09/10/2022 -  Radiation    Radiation Therapy Treatment Details (Noted on 09/10/2022)  Site: Prostate  Technique: IMRT  Goal: No goal specified  Planned Treatment Start Date: No planned start date specified         Interval history  He notes doing overall well today. No new issues. Continues having chronic rectal pain, managing with oxycodone 5mg  every 4-6 hours. Notes it won't slow me down. Having regular bowel movements. No issues with medications.    Allergies:  No Known Allergies    Current Medications:    Current Outpatient Medications:     abiraterone (ZYTIGA) 250 mg tablet, Take 4 tablets (1,000 mg total) by mouth daily., Disp: 120 tablet, Rfl: 11    acetaminophen (TYLENOL EXTRA STRENGTH) 500 MG tablet, Take 2 tablets (1,000 mg total) by mouth two (2) times a day as needed for pain., Disp: 100 tablet, Rfl: 2    amlodipine (NORVASC) 5 MG tablet, Take 1 tablet (5 mg total) by mouth daily., Disp: 90 tablet, Rfl: 3    aspirin (ECOTRIN) 81 MG tablet, Take 1 tablet (81 mg total) by mouth daily., Disp: 90 tablet, Rfl: 3    lidocaine (XYLOCAINE) 5 % ointment, Apply to affected area daily, Disp: 37 g, Rfl: 0    naloxone (NARCAN) 4 mg nasal spray, One spray in either nostril once for known/suspected opioid overdose. May repeat every 2-3 minutes in alternating nostril til EMS arrives, Disp: 2 each, Rfl: 3    oxybutynin (DITROPAN) 5 MG tablet, Take 1 tablet (5 mg total) by mouth Three (3) times a day., Disp: 90 tablet, Rfl: 11    polyethylene glycol (MIRALAX) 17 gram packet, Take 17 g by mouth two (2) times a day., Disp: 100 packet, Rfl: 2    predniSONE (DELTASONE) 5 MG tablet, Take 1 tablet (5 mg total) by mouth daily., Disp: 14 tablet, Rfl: 0    predniSONE (DELTASONE) 5 MG tablet, Take  1 tablet (5 mg total) by mouth daily., Disp: 30 tablet, Rfl: 11    tamsulosin (FLOMAX) 0.4 mg capsule, Take 1 capsule (0.4 mg total) by mouth daily., Disp: 30 capsule, Rfl: 11    cyclobenzaprine (FLEXERIL) 10 MG tablet, Take 1 tablet (10 mg total) by mouth nightly as needed for muscle spasms., Disp: 30 tablet, Rfl: 3    [START ON 02/26/2023] oxyCODONE (ROXICODONE) 5 MG immediate release tablet, Take 1 tablet (5 mg total) by mouth every four (4) hours as needed for pain., Disp: 168 tablet, Rfl: 0    SENNA 8.6 mg tablet, Take 2 tablets by mouth daily., Disp: 60 tablet, Rfl: 5  No current facility-administered medications for this visit.    Past Medical History and Social History  Past Medical History:   Diagnosis Date    Anxiety     Arthritis     Basal cell carcinoma     Cancer (CMS-HCC)     skin    Gout     Kidney stones     Squamous cell skin cancer     Stroke (CMS-HCC)       Past Surgical History:   Procedure Laterality Date    PR BIOPSY OF PROSTATE,NEEDLE/PUNCH N/A 05/21/2022    Procedure: IMAGE GUIDED BIOPSY, PROSTATE; NEEDLE OR PUNCH, SINGLE OR MULTIPLE, ANY APPROACH;  Surgeon: Phillips Grout, MD;  Location: [facility] OR [facility];  Service: Urology    PR TRANSURETHRAL ELEC-SURG PROSTATECTOM N/A 05/21/2022    Procedure: TRANSURETHRAL ELECTROSURGICAL RESECT PROSTATE, W/CONTORL POSTOP BLEED, COMPLETE (VASECT, MEATOT, CYSTO ETC);  Surgeon: Phillips Grout, MD;  Location: [facility] OR [facility];  Service: Urology    SKIN BIOPSY      skin cancer removal           Social History     Occupational History    Not on file   Tobacco Use    Smoking status: Every Day     Current packs/day: 1.50     Average packs/day: 1.5 packs/day for 45.0 years (67.5 ttl pk-yrs)     Types: Cigarettes    Smokeless tobacco: Never   Vaping Use    Vaping status: Never Used   Substance and Sexual Activity    Alcohol use: No     Alcohol/week: 0.0 standard drinks of alcohol     Comment: socially     Drug use: Yes     Types: Marijuana    Sexual activity: Not Currently       Family History  Family History   Problem Relation Age of Onset    Cancer Father         Skin    Cancer Brother         skin     Substance Abuse Disorder Neg Hx      Prostate Cancer Family History Assessment:  History of cancer in children (yes/no; if yes, what type AND age of diagnosis): No  Total number of siblings (including deceased): 2  History of cancer in siblings (yes/no; if yes, provide relation, type of cancer, AND age of diagnosis): Brother superficial basal cell; age unknown  History of cancer in parents (yes/no; if yes, please specify parent, type of cancer, AND age of diagnosis): Father superficial basal cell carcinoma; age unknown  History of cancer in aunts/uncles/grandparents (yes/no; if yes, provide relation, type of cancer, AND age of diagnosis): No      Review of Systems:  A comprehensive review of 10 systems was negative except  for pertinent positives noted in HPI.    Physical Exam:    VITAL SIGNS:  BP 146/81  - Pulse 58  - Temp 36.8 ??C (98.2 ??F) (Temporal)  - Resp 16  - Ht 167.6 cm (5' 6)  - SpO2 99%  - BMI 22.52 kg/m??   ECOG Performance Status: 1  GENERAL: Well-developed, well-nourished patient in no acute distress.  HEAD: Normocephalic and atraumatic.  EYES: Conjunctivae are normal. No scleral icterus.  MOUTH/THROAT: Oropharynx is clear and moist.  No mucosal lesions.  NECK: Supple, no thyromegaly.  LYMPHATICS: No palpable cervical, supraclavicular, or axillary adenopathy.  CARDIOVASCULAR: Normal rate, regular rhythm and normal heart sounds.  Exam reveals no gallop and no friction rub.  No murmur heard.  PULMONARY/CHEST: Effort normal and breath sounds normal. No respiratory distress.  ABDOMINAL:  Soft. There is no distension. There is no tenderness. There is no rebound and no guarding.  MUSCULOSKELETAL: No clubbing, cyanosis, or lower extremity edema.  PSYCHIATRIC: Alert and oriented.  Normal mood and affect.  NEUROLOGIC: No focal motor deficit. Normal gait.  SKIN: Skin is warm, dry, and intact.      Results/Orders:    Office Visit on 02/16/2023   Component Date Value Ref Range Status    Amphetamines Screen, Ur 02/16/2023 Negative  <500 ng/mL Final    Barbiturates Screen, Ur 02/16/2023 Negative  <200 ng/mL Final    Benzodiazepines Screen, Urine 02/16/2023 Negative  <200 ng/mL Final    Cannabinoids Screen, Ur 02/16/2023 Positive (A)  <20 ng/mL Final    Methadone Screen, Urine 02/16/2023 Negative  <300 ng/mL Final    Cocaine(Metab.)Screen, Urine 02/16/2023 Negative  <150 ng/mL Final    Opiates Screen, Ur 02/16/2023 Positive (A)  <300 ng/mL Final    Fentanyl Screen, Ur 02/16/2023 Negative  <1.0 ng/mL Final    Oxycodone Screen, Ur 02/16/2023 Positive (A)  <100 ng/mL Final    Buprenorphine, Urine 02/16/2023 Negative  <5 ng/mL Final   Lab on 02/16/2023   Component Date Value Ref Range Status    PSA 02/16/2023 <0.04  0.00 - 4.00 ng/mL Final    Potassium 02/16/2023 3.7  3.5 - 5.1 mmol/L Final    Albumin 02/16/2023 3.5  3.4 - 5.0 g/dL Final    Total Protein 02/16/2023 5.9  5.7 - 8.2 g/dL Final    Total Bilirubin 02/16/2023 0.4  0.3 - 1.2 mg/dL Final    Bilirubin, Direct 02/16/2023 0.20  0.00 - 0.30 mg/dL Final    AST 14/78/2956 11  <=34 U/L Final    ALT 02/16/2023 11  10 - 49 U/L Final    Alkaline Phosphatase 02/16/2023 75  46 - 116 U/L Final       Lab Results   Component Value Date    PSA <0.04 02/16/2023    PSA <0.04 01/07/2023    PSA <0.04 12/03/2022    PSA <0.04 10/30/2022    PSA <0.04 10/16/2022    PSA 0.09 10/05/2022    PSA 0.04 09/21/2022    PSA 0.09 09/07/2022    PSA 0.14 08/21/2022    PSA 0.27 08/07/2022         Orders placed or performed in visit on 02/16/23    Clinic Appointment Request Physician, Lab, Injection    Clinic Appointment Request Physician, Lab    LAB Appointment Request       Molecular Pathology  Tumor mutation profiling  [facility]: ZMYM3 mutation, TMB 3.7, MSS, no actionable mutations    Germline testing  N/A  Pathology:  A: Prostate, right apex, core biopsy  - Prostatic adenocarcinoma, Gleason score 4 + 4 = 8 (Grade group 4) involving 2 of 2 cores, approximately 4 and 1 mm in linear extent, approximately 20% of total core length.      B: Prostate, left apex, core biopsy  - Prostatic adenocarcinoma, Gleason score 4 + 4 = 8 (Grade group 4) involving 2 of 2 cores, approximately 7 and 5 mm in linear extent, approximately 60% of total core length.   - Perineural invasion identified in this case.      C: Prostate, left mid, core biopsy  - Prostatic adenocarcinoma, Gleason score 4 + 5 = 9 (Grade group 5) involving 2 of 2 cores, approximately 6 and 3mm in linear extent, approximately 90% of total core length.      D: Prostate, right mid, core biopsy  - Prostatic adenocarcinoma, Gleason score 4 + 5 = 9 (Grade group 5) involving 2 of 2 cores, approximately 6 and 5 mm in linear extent, approximately 70% of total core length.      E: Prostate, right base, core biopsy  - Benign prostate tissue.      F: Prostate, left base, core biopsy  - Prostatic adenocarcinoma, Gleason score 4 + 4 = 8 (Grade group 4)  involving 2 of 2 cores, approximately 3 and 1 mm in linear extent, approximately 20% of total core length.      G: Bladder neck and bilateral median lobe, transurethral resection  - Involved by prostatic adenocarcinoma with mixed acinar and ductal features, Gleason score 4 + 4 = 8 (Grade group 4)    - Lymphovascular invasion identified     The pattern 4 in this case demonstrates cribriform morphology.      Imaging results:    CT Chest w Contrast 06/11/22:   No intrathoracic metastasis.       CT Abdomen Pelvis W Contrast 06/11/22:     Enlarged irregular prostate correlates with history of biopsy-proven prostate cancer. There is redemonstrated irregularity of the posterior bladder and increased irregularity of the prostate at the bladder margin compared to prior may be secondary to tumor versus postsurgical/post TURP change. Consider attention on follow-up versus further evaluation with PSMA PET scan.       Questionable sclerotic changes of the right inferior pubic ramus are indeterminate and may represent normal variation although metastatic osseous disease is not excluded. Attention on follow-up bone scan. There is no other evidence of metastatic disease in the abdomen/pelvis.       Additional chronic and incidental findings as described.       Please see same day CT chest report for detailed findings above the diaphragm     NM Bone Scan Whole Body 06/11/22  No findings suggestive of osseous metastatic disease     PET CT 07/27/2022:   Impression   1.Ill-defined tracer avidity associated with the prostate, left greater than right, likely representing biopsy-proven prostate carcinoma.   2.Moderate avidity involving the right inferior pubic ramus which is suspicious for single site of osseous metastatic disease. Otherwise, no definite evidence of additional metastatic disease. More specifically, no avid adenopathy. Subcentimeter pulmonary nodules which are below the resolution of PET/CT and require continued attention on follow-up.

## 2023-02-16 NOTE — Unmapped (Signed)
[facility] blood drawn using (23) ga butterfly needle in left AC (1) successful attempt. Pt tolerated without difficulty. Blood work tubed to lab.

## 2023-02-16 NOTE — Unmapped (Deleted)
[facility] Visit Note  New Patient    Patient Name: Patrick Montgomery  Patient Age: 62 y.o.  Encounter Date: 02/16/2023  Attending Provider:  Arzu Mcgaughey E. 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    Patrick Montgomery is a 62 y.o. male who presents for management of newly diagnosed prostate cancer. He was initially seen in the ER In May 2023 for gross hematuria and  worsening LUTS. A CT scan of the bladder revealed a bladder mass. In June 2023, he was found to have a PSA of 18. He underwent cystoscopy, prostate biopsy, and TURP. Prostate biopsy showed Gleason 4+5=9 (GG 5), 10/12 cores positive for cancer. TURP specimen showed mixed acinar/ductal features, cribriborm morphology. In July 2023, CT and bone scan showed no pelvic or distant metastases.    On 06/18/22, we discussed management of his recent diagnosis of prostate cancer. Given that he has been diagnosed with prostate cancer at a very Wilda Wetherell age, I informed the patient that there may be features that indicate a higher risk of hereditary prostate cancer. 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   Based on current imaging, his cancer is confined to the prostate. A PSMA PET scan will be ordered to see if the cancer has metastasized.     On 07/07/2022, pt is now ready to move ahead with treatment. Will proceed with ADT plus abiraterone for two years, per STAMPEDE.  PSMA PET still undergoing authorization.    On 08/11/2022, most recent PSA on 08/01/2022 is 0.27. PSMA PET scan from 07/27/2022 showed avidity involving the right inferior pubic ramus which is suspicious for single site of osseous metastatic disease. Will proceed with Eligard shot and proceed with radiation with Dr. Carles Collet. May consider radiating single site of metastatic disease at the right inferior pubic ramus     Today, on 12/03/2022, PSA <0.04, on ADT plus abi, s/p RT to all involved sites. For pain, unlikely to be from cancer, may benefit from pain clinic management long-term.    Plan:  Continue ADT with Eligard, given last on 08/11/2022, due next on 02/09/2023. Anticipate 3 years of therapy (till 8-07/2025)  -- Follow PSA  Continue Abiraterone and prednisone   -- Follow labs/BP  -- Check lab in 5 weeks, then q3 months  Follow up with radiation oncology with Dr. Carles Collet at [facility]   5. Discussed Somatic tumor testing and germline testing  -- Get Tempus xT with normal at next visit  6. RTC 3 months    I personally spent 40 minutes face-to-face and non-face-to-face in the care of this patient, which includes all pre, intra, and post visit time on the date of service.  All documented time was specific to the E/M visit and does not include any procedures that may have been performed.    Reason for Visit  Follow up of prostate cancer.     History of Present Illness:  Hematology/Oncology History Overview Note   In 03/2022, seen in ER for gross hematuria, worsening LUTS. CT bladder showing bladder mass.  In 04/2022, PSA 18. Had cysto, prostate biopsy, TURP. Prostate biopsy showed Gleason 4+5=9 (GG 5), 10/12 cores positive for cancer. TURP specimen showed mixed acinar/ductal features, cribriborm morphology.  In 05/2022, CT and bone scan showed no pelvic or distant mets  In 06/2022, ADT started with Degarelix, continued with Eligard.  In 06/2022, PSMA PET showed avidity in R inferior pubic ramus, suspicious for single bone met.  In 07/2022, abiraterone/prednisone started.  In 08/2022, RT to prostate and bone met site.     Prostate cancer (CMS-HCC)   06/17/2022 Initial Diagnosis    Prostate cancer (CMS-HCC)     06/17/2022 -  Cancer Staged    Staging form: Prostate, AJCC 8th Edition  - Clinical: Stage IIIC (cT4, cN0, cM0, PSA: 18, Grade Group: 5) - Signed by Maurie Boettcher, MD on 06/19/2022       07/07/2022 - 07/07/2022 Endocrine/Hormone Therapy    OP DEGARELIX  Plan Provider: Maurie Boettcher, MD     08/11/2022 Endocrine/Hormone Therapy    OP PROSTATE LEUPROLIDE (ELIGARD) 45 MG EVERY 6 MONTHS  Plan Provider: Maurie Boettcher, MD     09/10/2022 -  Radiation    Radiation Therapy Treatment Details (Noted on 09/10/2022)  Site: Prostate  Technique: IMRT  Goal: No goal specified  Planned Treatment Start Date: No planned start date specified         Interval history    The patient returns for scheduled follow up, unaccompanied. Pt completed his RT, but continues to experience pain in the rectum and he notes that he has scheduled scans ordered by Dr. Carles Collet. He notes that if he doesn't have this pain, he is doing excellently. No other issues noted.        Allergies:  No Known Allergies    Current Medications:    Current Outpatient Medications:     abiraterone (ZYTIGA) 250 mg tablet, Take 4 tablets (1,000 mg total) by mouth daily., Disp: 120 tablet, Rfl: 11    acetaminophen (TYLENOL EXTRA STRENGTH) 500 MG tablet, Take 2 tablets (1,000 mg total) by mouth two (2) times a day as needed for pain., Disp: 100 tablet, Rfl: 2    amlodipine (NORVASC) 5 MG tablet, Take 1 tablet (5 mg total) by mouth daily., Disp: 90 tablet, Rfl: 3    aspirin (ECOTRIN) 81 MG tablet, Take 1 tablet (81 mg total) by mouth daily., Disp: 90 tablet, Rfl: 3    cyclobenzaprine (FLEXERIL) 10 MG tablet, Take 1 tablet (10 mg total) by mouth nightly as needed for muscle spasms. (Patient not taking: Reported on 12/15/2022), Disp: 30 tablet, Rfl: 0    lidocaine (XYLOCAINE) 5 % ointment, Apply to affected area daily (Patient not taking: Reported on 02/04/2023), Disp: 37 g, Rfl: 0    naloxone (NARCAN) 4 mg nasal spray, One spray in either nostril once for known/suspected opioid overdose. May repeat every 2-3 minutes in alternating nostril til EMS arrives, Disp: 2 each, Rfl: 3    naproxen (NAPROSYN) 500 MG tablet, Take 1 tablet (500 mg total) by mouth in the morning and 1 tablet (500 mg total) in the evening. Take with meals., Disp: 20 tablet, Rfl: 0    oxybutynin (DITROPAN) 5 MG tablet, Take 1 tablet (5 mg total) by mouth Three (3) times a day. (Patient  not taking: Reported on 02/04/2023), Disp: 90 tablet, Rfl: 11    oxyCODONE (ROXICODONE) 5 MG immediate release tablet, Take 1 tablet (5 mg total) by mouth every four (4) hours as needed for pain., Disp: 84 tablet, Rfl: 0    polyethylene glycol (MIRALAX) 17 gram packet, Take 17 g by mouth two (2) times a day. (Patient not taking: Reported on 02/04/2023), Disp: 100 packet, Rfl: 2    predniSONE (DELTASONE) 5 MG tablet, Take 1 tablet (5 mg total) by mouth daily., Disp: 14 tablet, Rfl: 0    predniSONE (DELTASONE) 5 MG tablet, Take 1 tablet (5 mg total) by mouth daily., Disp: 30 tablet, Rfl: 11    SENNA 8.6 mg tablet, Take 2 tablets by mouth daily., Disp: 30 tablet, Rfl: 5    tamsulosin (FLOMAX) 0.4 mg capsule, Take 1 capsule (0.4 mg total) by mouth daily., Disp: 30 capsule, Rfl: 11    Past Medical History and Social History  Past Medical History:   Diagnosis Date    Anxiety     Arthritis     Basal cell carcinoma     Cancer (CMS-HCC)     skin    Gout     Kidney stones     Squamous cell skin cancer     Stroke (CMS-HCC)       Past Surgical History:   Procedure Laterality Date    PR BIOPSY OF PROSTATE,NEEDLE/PUNCH N/A 05/21/2022    Procedure: IMAGE GUIDED BIOPSY, PROSTATE; NEEDLE OR PUNCH, SINGLE OR MULTIPLE, ANY APPROACH;  Surgeon: Phillips Grout, MD;  [facility];  Service: Urology    PR TRANSURETHRAL ELEC-SURG PROSTATECTOM N/A 05/21/2022    Procedure: TRANSURETHRAL ELECTROSURGICAL RESECT PROSTATE, W/CONTORL POSTOP BLEED, COMPLETE (VASECT, MEATOT, CYSTO ETC);  Surgeon: Phillips Grout, MD;  Location: [facility] OR [facility];  Service: Urology    SKIN BIOPSY      skin cancer removal           Social History     Occupational History    Not on file   Tobacco Use    Smoking status: Every Day     Current packs/day: 1.50     Average packs/day: 1.5 packs/day for 45.0 years (67.5 ttl pk-yrs)     Types: Cigarettes    Smokeless tobacco: Never   Vaping Use    Vaping status: Never Used   Substance and Sexual Activity    Alcohol use: No     Alcohol/week: 0.0 standard drinks of alcohol     Comment: socially     Drug use: Yes     Types: Marijuana    Sexual activity: Not Currently       Family History  Family History   Problem Relation Age of Onset    Cancer Father         Skin    Cancer Brother         skin     Substance Abuse Disorder Neg Hx      Prostate Cancer Family History Assessment:  History of cancer in children (yes/no; if yes, what type AND age of diagnosis): No  Total number of siblings (including deceased): 2  History of cancer in siblings (yes/no; if yes, provide relation, type of cancer, AND age of diagnosis): Brother superficial basal cell; age unknown  History of cancer in parents (yes/no; if yes, please specify parent, type of cancer, AND age of diagnosis): Father superficial basal cell carcinoma; age unknown  History of cancer  in aunts/uncles/grandparents (yes/no; if yes, provide relation, type of cancer, AND age of diagnosis): No      Review of Systems:  A comprehensive review of 10 systems was negative except for pertinent positives noted in HPI.    Physical Exam:    VITAL SIGNS:  There were no vitals taken for this visit.  ECOG Performance Status: 1  GENERAL: Well-developed, well-nourished patient in no acute distress.  HEAD: Normocephalic and atraumatic.  EYES: Conjunctivae are normal. No scleral icterus.  MOUTH/THROAT: Oropharynx is clear and moist.  No mucosal lesions.  NECK: Supple, no thyromegaly.  LYMPHATICS: No palpable cervical, supraclavicular, or axillary adenopathy.  CARDIOVASCULAR: Normal rate, regular rhythm and normal heart sounds.  Exam reveals no gallop and no friction rub.  No murmur heard.  PULMONARY/CHEST: Effort normal and breath sounds normal. No respiratory distress.  ABDOMINAL:  Soft. There is no distension. There is no tenderness. There is no rebound and no guarding.  MUSCULOSKELETAL: No clubbing, cyanosis, or lower extremity edema.  PSYCHIATRIC: Alert and oriented.  Normal mood and affect.  NEUROLOGIC: No focal motor deficit. Normal gait.  SKIN: Skin is warm, dry, and intact.      Results/Orders:    No visits with results within 2 Week(s) from this visit.   Latest known visit with results is:   Procedure visit on 01/28/2023   Component Date Value Ref Range Status    Spec Gravity/POC 01/28/2023 1.015  1.003 - 1.030 Final    PH/POC 01/28/2023 7.0  5.0 - 9.0 Final    Leuk Esterase/POC 01/28/2023 Negative  Negative Final    Nitrite/POC 01/28/2023 Negative  Negative Final    Protein/POC 01/28/2023 Negative  Negative Final    UA Glucose/POC 01/28/2023 Negative  Negative Final    Ketones, POC 01/28/2023 Negative  Negative Final    Bilirubin/POC 01/28/2023 Negative  Negative Final    Blood/POC 01/28/2023 2+ (A)  Negative Final    Urobilinogen/POC 01/28/2023 0.2  0.2 - 1.0 mg/dL Final       Lab Results   Component Value Date    PSA <0.04 01/07/2023    PSA <0.04 12/03/2022    PSA <0.04 10/30/2022    PSA <0.04 10/16/2022    PSA 0.09 10/05/2022    PSA 0.04 09/21/2022    PSA 0.09 09/07/2022    PSA 0.14 08/21/2022    PSA 0.27 08/07/2022    PSA 1.66 07/24/2022         Orders placed or performed in visit on 02/05/23    Therapeutic Injection Appointment Request       Molecular Pathology  Tumor mutation profiling  N/A  Germline testing  N/A    Pathology:  A: Prostate, right apex, core biopsy  - Prostatic adenocarcinoma, Gleason score 4 + 4 = 8 (Grade group 4) involving 2 of 2 cores, approximately 4 and 1 mm in linear extent, approximately 20% of total core length.      B: Prostate, left apex, core biopsy  - Prostatic adenocarcinoma, Gleason score 4 + 4 = 8 (Grade group 4) involving 2 of 2 cores, approximately 7 and 5 mm in linear extent, approximately 60% of total core length.   - Perineural invasion identified in this case.      C: Prostate, left mid, core biopsy  - Prostatic adenocarcinoma, Gleason score 4 + 5 = 9 (Grade group 5) involving 2 of 2 cores, approximately 6 and 3mm in linear extent, approximately 90% of total core length.  D: Prostate, right mid, core biopsy  - Prostatic adenocarcinoma, Gleason score 4 + 5 = 9 (Grade group 5) involving 2 of 2 cores, approximately 6 and 5 mm in linear extent, approximately 70% of total core length.      E: Prostate, right base, core biopsy  - Benign prostate tissue.      F: Prostate, left base, core biopsy  - Prostatic adenocarcinoma, Gleason score 4 + 4 = 8 (Grade group 4)  involving 2 of 2 cores, approximately 3 and 1 mm in linear extent, approximately 20% of total core length.      G: Bladder neck and bilateral median lobe, transurethral resection  - Involved by prostatic adenocarcinoma with mixed acinar and ductal features, Gleason score 4 + 4 = 8 (Grade group 4)    - Lymphovascular invasion identified     The pattern 4 in this case demonstrates cribriform morphology.      Imaging results:    CT Chest w Contrast 06/11/22:   No intrathoracic metastasis.       CT Abdomen Pelvis W Contrast 06/11/22:     Enlarged irregular prostate correlates with history of biopsy-proven prostate cancer. There is redemonstrated irregularity of the posterior bladder and increased irregularity of the prostate at the bladder margin compared to prior may be secondary to tumor versus postsurgical/post TURP change. Consider attention on follow-up versus further evaluation with PSMA PET scan.       Questionable sclerotic changes of the right inferior pubic ramus are indeterminate and may represent normal variation although metastatic osseous disease is not excluded. Attention on follow-up bone scan. There is no other evidence of metastatic disease in the abdomen/pelvis.       Additional chronic and incidental findings as described.       Please see same day CT chest report for detailed findings above the diaphragm     NM Bone Scan Whole Body 06/11/22  No findings suggestive of osseous metastatic disease     PET CT 07/27/2022:   Impression   1.Ill-defined tracer avidity associated with the prostate, left greater than right, likely representing biopsy-proven prostate carcinoma.   2.Moderate avidity involving the right inferior pubic ramus which is suspicious for single site of osseous metastatic disease. Otherwise, no definite evidence of additional metastatic disease. More specifically, no avid adenopathy. Subcentimeter pulmonary nodules which are below the resolution of PET/CT and require continued attention on follow-up.

## 2023-02-16 NOTE — Unmapped (Signed)
OUTPATIENT ONCOLOGY PALLIATIVE CARE CONSULT NOTE    Patrick Montgomery is a 62 y.o. male who is seen in consultation at the request of [name] for evaluation of Symptoms     Principal Diagnosis: Patrick Montgomery is a 62 y.o. male with prostate cancer, diagnosed in June 2023 now on ADT with Eliguard.  Disease sites include prostate, ?solitary bone metastasis.     Assessment/Plan:     #Cancer-Related Pain: Improving. Most severe pain in the right perineal area.  Unclear if cancer related although this does seem to correlate with CT finding of increased conspicuity of right inferior pubic ramus mixed lytic sclerotic lesion.  Unclear if pain is from this point disease versus radiation changes. Constiaption now ipmroved so less likely etiology.  May also have neuropathic component given reported intermittent radiating pain down right leg and up his back.  - Refill oxycodone 5mg  q4 hrs PRN   - Discussed trial of Xtampza in next 1-2 visits if still needing oxycodone every 4 hours.   - Cont tylenol 1000 mg TID PRN  - Continue oxycodone 5 mg every 4 hours PRN with goal of eventual wean  - Refill flexeril 10mg  nightly PRN  - Cont Lidocaine ointment to tender area of skin  - Consider reimaging of pelvis/perineum if pain worsens or persist    Prior Medications:   - MS contin - didn't find effective.  Oxycodone 5 mg effective.  - Naproxen BID - mild benefit  - Tylenol - mild to no benefit    #Cancer-Related Fatigue: Stable. Still able to be active.   - CTM  - Encouraged light exercise as able    #Constipation: Improved, but not fully resolved. Better with senna. Not really using miralax so stool still hard. Has been requiring milk of magnesia to have a good bowel movement, and has not had 1 for at least several days.  - Cont senna 2 tablets nightly  - Encouraged resumption of miralax BID whenever he notices hard stools. Also okay to use milk of magnesia  - Low threshold to escalate regimen vs. Start naloxegol    #Mood/Coping: Stable.  Good support from his wife, mother and children. Stays positive by nature.     #Goals of Care/Prognostic Awareness: From prior convos:  - Knows he has prostate cancer and is followed by Dr. Regino Schultze.  Knows that recent PSA was good.  Understands that he is taking medicine through 2026 for this.  Unsure if his disease is curable or not and what the overall trajectory of his prognosis is, but would like to know.  Is a type of person who would want to know details about his disease and prognosis going forward.  - Patient shares his understanding that he has metastatic prostate cancer. He does not recall much about what to expect from this cancer, including about prognosis. He knows that there are several treatment options, but is not sure whether it is curable or not.   - We asked whether he is the type of person who wants to know details about prognosis and he reports that he would like to know. He appreciates honest and direct communication. We said that we would discuss with oncology so that we can provide accurate communication.   - Finally, we asked who his preferred HCDM is and he says that it is his domestic partner, Patrick Montgomery. We discussed that she would not be default HCPOA by state law and recommended completing formal HCPOA paperwork to allow her  to be his surrogate if needed in the future.     #Advance Care Planning:  HCDM:   HCDM (patient stated preference): Patrick Montgomery - Domestic Partner [phone]  Natural surrogate decision maker: majority of adult children and living parents  Advance Directive: none on file - wants to do HCPOA      #Controlled substances risk management.   - Patient has a signed pain medication agreement with Outpatient Palliative Care, completed on 01/28/23, as per standard of care.   - [place]CCS[place] database was reviewed today and it was appropriate.   - Urine drug screen was performed at this visit. Findings:  pending .   - Patient has received information about safe storage and administration of medications.   - Patient has received a prescription for narcan and patient is not applicable.     F/u: 4-6 weeks    ----------------------------------------    Referring Provider: Unknown Per Patient Ref*  Oncology Team: Patrick Montgomery  PCP: Patrick Lever, FNP    Interval Hx, 02/16/23:    Patient reports that he is doing well overall. He got a good report from oncology team today. PSA not detectable he will be spaced out to routine q3 month monitoring.     He does continuing to have perineal pain, but feels like it may be improving slightly. He still needs oxycodone 5mg  approx every 4 hours while awake. Able to sleep through the night without significant pain. Oxycodone improves from a 10/10 to a 4-5/10. Flexeril seems to help too.     On ROS, still with constipation, but this is improved since starting senna. Doesn't regularly use miralax and his stool remains more firm.     Energy pretty good. Appetite still down. Eats every meal but has early satiety. No n/v/d or dyspnea.     Remains in good spirits. His daughter is turnign 23 this month and he is excited for her.     HPI: Patrick Montgomery is a 62 year old male with history of prostate cancer establishing with palliative care today primarily for pain control.    In terms of pain, patient locates the pain just to the right of his rectum, sometimes with radiation down his leg and up his back. He describes the pain as a sharp and excruciating pain. He notes that it is constant. It is worse after moving around all day and worse when sitting down.  It feels better if he massages the area.  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    Palliative Performance Scale: 60% - Ambulation: Reduced / unable to do hobby or some housework, significant disease / Self-Care:Occasional assist as necessary / Intake:Normal or reduced / Level of Conscious: Full or confusion    Social History:   Lives in [place], [place] with his domestic partner, Patrick Montgomery (not officially married). They have an 18yo daughter. Also has a 26yo son who runs a comic book store. Mother is living and also a source of support.    Watches church every Sunday - [employer]. Also belongs to a [employer].     Occupation: Retired Surveyor, minerals  Hobbies: Likes to race go-karts and cars. Likes to watch his nephew do this as well.     Objective     Hematology/Oncology History Overview Note   In 03/2022, seen in ER for gross hematuria, worsening LUTS. CT bladder showing bladder mass.  In 04/2022, PSA 18. Had cysto, prostate biopsy, TURP. Prostate biopsy showed Gleason 4+5=9 (GG 5), 10/12 cores positive for cancer. TURP specimen showed mixed acinar/ductal features, cribriborm morphology.  In 05/2022, CT and bone scan showed no pelvic or distant mets  In 06/2022, ADT started with Degarelix, continued with Eligard.  In 06/2022, PSMA PET showed avidity in R inferior pubic ramus, suspicious for single bone met.  In 07/2022, abiraterone/prednisone started.  In 08/2022, RT to prostate and bone met site.     Prostate cancer (CMS-HCC)   06/17/2022 Initial Diagnosis    Prostate cancer (CMS-HCC)     06/17/2022 -  Cancer Staged    Staging form: Prostate, AJCC 8th Edition  - Clinical: Stage IIIC (cT4, cN0, cM0, PSA: 18, Grade Group: 5) - Signed by Maurie Boettcher, MD on 06/19/2022       07/07/2022 - 07/07/2022 Endocrine/Hormone Therapy    OP DEGARELIX  Plan Provider: Maurie Boettcher, MD     08/11/2022 Endocrine/Hormone Therapy    OP PROSTATE LEUPROLIDE (ELIGARD) 45 MG EVERY 6 MONTHS  Plan Provider: Maurie Boettcher, MD     09/10/2022 -  Radiation    Radiation Therapy Treatment Details (Noted on 09/10/2022)  Site: Prostate  Technique: IMRT  Goal: No goal specified  Planned Treatment Start Date: No planned start date specified       REVIEW OF SYSTEMS:  A comprehensive review of 10 systems was negative except for pertinent positives noted in HPI.    PHYSICAL EXAM:   Vital signs for this encounter: VS reviewed in EPIC.  GEN: pleasant male in no acute distress  CV: Warm and well-perfused  LUNGS: No increased work of breathing on room air  ABD: NT, ND  SKIN: No rashes  MSK: No sarcopenia,   EXT: No edema noted of the lower extremities  NEURO: No focal deficits appreciated.  PSYCH: Alert and oriented to person, place and time. Euthymic.     I personally spent 50 minutes face-to-face and non-face-to-face in the care of this patient, which includes all pre, intra, and post visit time on the date of service.  All documented time was specific to the E/M visit and does not include any procedures that may have been performed.      Redgie Grayer, MD, MEd  [facility] Outpatient Oncology Palliative Care

## 2023-02-16 NOTE — Unmapped (Signed)
Pt tolerated Eligard injection administered in right lower abdomen without difficulty. Band aid and gauze applied. Pt left [facility] ambulatory, steady gait, NAD, no questions, complaints, nor concerns voiced at d/c.

## 2023-02-22 LAB — OPIATE, URINE, QUANTITATIVE
6-MONOACETYLMRPH: <5 ng/mL (ref <5)
BUPRENORPHINE: <5 ng/mL (ref <5)
CODEINE GC/MS CONF: <25 ng/mL (ref <25)
FENTANYL, URINE GC/MS: <0.5 ng/mL (ref <0.5)
HYDROCODONE GC/MS CONF: <25 ng/mL (ref <25)
HYDROMORPHONE GC/MS CONF: <25 ng/mL (ref <25)
MORPHINE GC/MS CONF: <25 ng/mL (ref <25)
NORBUPRENORPHINE: <5 ng/mL (ref <5)
NORFENTANYL, UR GC/MS: <1 ng/mL (ref <1.0)
OPIATE INTERP: POSITIVE
OXYCODONE (GC/MS): 6092 ng/mL — ABNORMAL HIGH (ref <25)
OXYMORPHONE: 1801 ng/mL — ABNORMAL HIGH (ref <25)

## 2023-02-24 MED FILL — AMLODIPINE 5 MG TABLET: ORAL | 90 days supply | Qty: 90 | Fill #1

## 2023-02-26 MED ORDER — OXYCODONE 5 MG TABLET
ORAL_TABLET | ORAL | 0 refills | 28 days | Status: CP | PRN
Start: 2023-02-26 — End: ?

## 2023-03-11 NOTE — Unmapped (Signed)
[facility] Specialty Pharmacy Refill Coordination Note    Specialty Medication(s) to be Shipped:   Hematology/Oncology: abiraterone 250 mg and Transplant: Prednisone 5mg     Other medication(s) to be shipped: No additional medications requested for fill at this time     Patrick Montgomery, DOB: 1961/06/08  Phone: [phone] (home)       All above HIPAA information was verified with patient.     Was a Nurse, learning disability used for this call? No    Completed refill call assessment today to schedule patient's medication shipment from the [facility] [phone]).  All relevant notes have been reviewed.     Specialty medication(s) and dose(s) confirmed: Regimen is correct and unchanged.   Changes to medications: Patrick Montgomery reports no changes at this time.  Changes to insurance: No  New side effects reported not previously addressed with a pharmacist or physician: None reported  Questions for the pharmacist: No    Confirmed patient received a Conservation officer, historic buildings and a Surveyor, mining with first shipment. The patient will receive a drug information handout for each medication shipped and additional FDA Medication Guides as required.       DISEASE/MEDICATION-SPECIFIC INFORMATION        N/A    SPECIALTY MEDICATION ADHERENCE     Medication Adherence    Patient reported X missed doses in the last month: 0  Specialty Medication: Abiraterone 250 mg  Patient is on additional specialty medications: Yes  Additional Specialty Medications: Prednisone 5mg   Patient Reported Additional Medication X Missed Doses in the Last Month: 0  Patient is on more than two specialty medications: No  Informant: patient     Were doses missed due to medication being on hold? No    Abiraterone 250 mg: 2 days of medicine on hand   Prednisone 5 mg: 2 days of medicine on hand       REFERRAL TO PHARMACIST     Referral to the pharmacist: Not needed      [facility]     Shipping address confirmed in Epic.     Delivery Scheduled: Yes, Expected medication delivery date: 03/12/23.     Medication will be delivered via Same Day Courier to the prescription address in Epic [place].    Patrick Montgomery   [facility] Specialty Technician

## 2023-03-12 MED FILL — ABIRATERONE 250 MG TABLET: ORAL | 30 days supply | Qty: 120 | Fill #8

## 2023-03-12 MED FILL — PREDNISONE 5 MG TABLET: ORAL | 30 days supply | Qty: 30 | Fill #1

## 2023-03-22 ENCOUNTER — Telehealth
Admit: 2023-03-22 | Discharge: 2023-03-23 | Payer: PRIVATE HEALTH INSURANCE | Attending: Student in an Organized Health Care Education/Training Program | Primary: Student in an Organized Health Care Education/Training Program

## 2023-03-22 DIAGNOSIS — R630 Anorexia: Principal | ICD-10-CM

## 2023-03-22 DIAGNOSIS — Z515 Encounter for palliative care: Principal | ICD-10-CM

## 2023-03-22 DIAGNOSIS — G893 Neoplasm related pain (acute) (chronic): Principal | ICD-10-CM

## 2023-03-22 DIAGNOSIS — R530 Neoplastic (malignant) related fatigue: Principal | ICD-10-CM

## 2023-03-22 DIAGNOSIS — R399 Unspecified symptoms and signs involving the genitourinary system: Principal | ICD-10-CM

## 2023-03-22 DIAGNOSIS — C61 Malignant neoplasm of prostate: Principal | ICD-10-CM

## 2023-03-22 DIAGNOSIS — K5903 Drug induced constipation: Principal | ICD-10-CM

## 2023-03-22 MED ORDER — SENNOSIDES 8.6 MG TABLET
ORAL_TABLET | Freq: Every evening | ORAL | 5 refills | 60 days | Status: CP
Start: 2023-03-22 — End: ?

## 2023-03-22 MED ORDER — OXYCODONE 5 MG TABLET
ORAL_TABLET | ORAL | 0 refills | 19 days | Status: CP | PRN
Start: 2023-03-22 — End: ?

## 2023-03-22 MED ORDER — CYCLOBENZAPRINE 10 MG TABLET
ORAL_TABLET | Freq: Every evening | ORAL | 3 refills | 30 days | Status: CP | PRN
Start: 2023-03-22 — End: ?

## 2023-03-22 MED ORDER — OXYCODONE ER 9 MG CAPSULE SPRINKLE EXTENDED RELEASE 12 HR(DON'T CRUSH)
ORAL_CAPSULE | Freq: Two times a day (BID) | ORAL | 0 refills | 28 days | Status: CP
Start: 2023-03-22 — End: ?

## 2023-03-22 NOTE — Unmapped (Signed)
OUTPATIENT ONCOLOGY PALLIATIVE CARE CONSULT NOTE    Patrick Montgomery is a 62 y.o. male who is seen in consultation at the request of [name] for evaluation of Symptoms     Principal Diagnosis: Patrick Montgomery is a 62 y.o. male with prostate cancer, diagnosed in June 2023 now on ADT with Eliguard.  Disease sites include prostate, ?solitary bone metastasis.     Assessment/Plan:     #Cancer-Related Pain: Worsening. Severe pain in the right perineal area.  Unclear if cancer related although this does seem to correlate with CT finding of increased conspicuity of right inferior pubic ramus mixed lytic sclerotic lesion.  Unclear if pain is from this point disease versus radiation changes.  May also have neuropathic component given reported intermittent radiating pain down right leg and up his back.  - Start Xtampza 9mg  BID  - Adjust oxycodone to 5-10 mg q4 hrs PRN --> re-iterated that our clinic will not be able to provide early refills in the future aside from this one-time occurrence - he expressed understanding and confirmed that he knows how to reach our clinic if pain worsening.   - Cont tylenol 1000 mg TID PRN  - Refill flexeril 10mg  nightly PRN  - Cont Lidocaine ointment to tender area of skin  - Consider reimaging of pelvis/perineum if pain worsens or persist    Prior Medications:   - MS contin - didn't find effective.  Oxycodone 5 mg effective.  - Naproxen BID - mild benefit  - Tylenol - mild to no benefit    #Cancer-Related Fatigue: Stable. Still able to be active. Gets out of house daily.   - CTM  - Encouraged light exercise as able    #Early Satiety: Improving, but not resolved. Eats every meal, jsut not large quantity. Snacks as well.   - CTM, possible marinol trial in future     #Constipation: Worse, having hard stools since stopping bowel regimen.   - Restart senna 1 tablet nightly  - Restart miralax - told him okay to use daily instead of BID if BID is not going to reasonably happen for him. Told him once is better than nothing and that he can always take PRN doses.   - Low threshold to escalate regimen vs. Start naloxegol    #Mood/Coping: Stable.  Good support from his wife, mother and children. Stays positive by nature.     #Goals of Care/Prognostic Awareness: Discussed again today that we should aim to do HCPOA paperwork at next in-perosn opportunity.     From prior convos:  - Knows he has prostate cancer and is followed by Dr. Regino Schultze.  Knows that recent PSA was good.  Understands that he is taking medicine through 2026 for this.  Unsure if his disease is curable or not and what the overall trajectory of his prognosis is, but would like to know.  Is a type of person who would want to know details about his disease and prognosis going forward.  - Patient shares his understanding that he has metastatic prostate cancer. He does not recall much about what to expect from this cancer, including about prognosis. He knows that there are several treatment options, but is not sure whether it is curable or not.   - We asked whether he is the type of person who wants to know details about prognosis and he reports that he would like to know. He appreciates honest and direct communication. We said that we would discuss with oncology  so that we can provide accurate communication.   - Finally, we asked who his preferred HCDM is and he says that it is his domestic partner, Patrick Montgomery. We discussed that she would not be default HCPOA by state law and recommended completing formal HCPOA paperwork to allow her to be his surrogate if needed in the future.     #Advance Care Planning:  HCDM:   HCDM (patient stated preference): Patrick Montgomery - Domestic Partner [phone]  Natural surrogate decision maker: majority of adult children and living parents  Advance Directive: none on file - wants to do HCPOA      #Controlled substances risk management.   - Patient has a signed pain medication agreement with Outpatient Palliative Care, completed on 01/28/23, as per standard of care.   - NCCSRS database was reviewed today and it was appropriate.   - Urine drug screen was performed at this visit. Findings: periodic screening has been appropriate.   - Patient has received information about safe storage and administration of medications.   - Patient has received a prescription for narcan and patient is not applicable.     F/u: 4-6 weeks    ----------------------------------------    Referring Provider: Ortencia Kick  Oncology Team: [name]  PCP: Deneise Lever, FNP    Interval Hx, 03/22/23:    He continues to have pain in his R pelvic region with radiation to his tailbone.   Pain got so bad that he doubled up his dose and has now run out for past 3 days. Pain is significantly worse since running out of oxycodone.     On typical day, doesn't take oxycodone during the night.     Has been having constipation. Has daily BMs, but strains a lot more and stools are hard.     Mood is good. Positive and grateful for his life.     Not really back to working, but does help his brother with his painting projects (runs errands for him).     Appetite okay. Not sure if his weight is stable or not. No n/v or diarrhea. Energy pretty good.     Had hematuria earllier this month, but cleared after he felt like he passed kidney stone. NO issues since.     HPI: Patrick Montgomery is a 62 year old male with history of prostate cancer establishing with palliative care today primarily for pain control.    In terms of pain, patient locates the pain just to the right of his rectum, sometimes with radiation down his leg and up his back. He describes the pain as a sharp and excruciating pain. He notes that it is constant. It is worse after moving around all day and worse when sitting down.  It feels better if he massages the area.  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    Palliative Performance Scale: 60% - Ambulation: Reduced / unable to do hobby or some housework, significant disease / Self-Care:Occasional assist as necessary / Intake:Normal or reduced / Level of Conscious: Full or confusion    Social History:   Lives in [place], [place] with his domestic partner, Belenda Cruise (not officially married). They have an 18yo daughter. Also has a 26yo son who runs a comic book store. Mother is living and also a source of support.    Watches church every Sunday - [employer]. Also belongs to  a [employer].     Occupation: Retired Surveyor, minerals  Hobbies: Likes to race go-karts and cars. Likes to watch his nephew do this as well.     Objective     Hematology/Oncology History Overview Note   In 03/2022, seen in ER for gross hematuria, worsening LUTS. CT bladder showing bladder mass.  In 04/2022, PSA 18. Had cysto, prostate biopsy, TURP. Prostate biopsy showed Gleason 4+5=9 (GG 5), 10/12 cores positive for cancer. TURP specimen showed mixed acinar/ductal features, cribriborm morphology.  In 05/2022, CT and bone scan showed no pelvic or distant mets  In 06/2022, ADT started with Degarelix, continued with Eligard.  In 06/2022, PSMA PET showed avidity in R inferior pubic ramus, suspicious for single bone met.  In 07/2022, abiraterone/prednisone started.  In 08/2022, RT to prostate/SV/nodes and pubic ramus bone met site, completed in 12/2022.     Prostate cancer (CMS-HCC)   06/17/2022 Initial Diagnosis    Prostate cancer (CMS-HCC)     06/17/2022 -  Cancer Staged    Staging form: Prostate, AJCC 8th Edition  - Clinical: Stage IIIC (cT4, cN0, cM0, PSA: 18, Grade Group: 5) - Signed by Maurie Boettcher, MD on 06/19/2022       07/07/2022 - 07/07/2022 Endocrine/Hormone Therapy    OP DEGARELIX  Plan Provider: Maurie Boettcher, MD     08/11/2022 Endocrine/Hormone Therapy    OP PROSTATE LEUPROLIDE (ELIGARD) 45 MG EVERY 6 MONTHS  Plan Provider: Maurie Boettcher, MD     09/10/2022 -  Radiation    Radiation Therapy Treatment Details (Noted on 09/10/2022)  Site: Prostate  Technique: IMRT  Goal: No goal specified  Planned Treatment Start Date: No planned start date specified       REVIEW OF SYSTEMS:  A comprehensive review of 10 systems was negative except for pertinent positives noted in HPI.    PHYSICAL EXAM:   Phone visit             The patient reports they are physically located in [place] and is currently: at home. I conducted a phone visit.  I spent 25 minutes on the phone call with the patient on the date of service .         Redgie Grayer, MD, MEd  [facility] Outpatient Oncology Palliative Care

## 2023-04-02 NOTE — Unmapped (Signed)
[facility] Pharmacy Refill Coordination Note    Specialty Medication(s) to be Shipped:   Hematology/Oncology: abiraterone 250mg     Other medication(s) to be shipped:  Prednisone     Patrick Montgomery, DOB: Aug 06, 1961  Phone: [phone] (home)       All above HIPAA information was verified with patient.     Was a Nurse, learning disability used for this call? No    Completed refill call assessment today to schedule patient's medication shipment from the [facility] [phone]).  All relevant notes have been reviewed.     Specialty medication(s) and dose(s) confirmed: Regimen is correct and unchanged.   Changes to medications: Patrick Montgomery reports no changes at this time.  Changes to insurance: No  New side effects reported not previously addressed with a pharmacist or physician: None reported  Questions for the pharmacist: No    Confirmed patient received a Conservation officer, historic buildings and a Surveyor, mining with first shipment. The patient will receive a drug information handout for each medication shipped and additional FDA Medication Guides as required.       DISEASE/MEDICATION-SPECIFIC INFORMATION        N/A    SPECIALTY MEDICATION ADHERENCE     Medication Adherence    Patient reported X missed doses in the last month: 0  Specialty Medication: Abiraterone 250mg   Patient is on additional specialty medications: No  Informant: patient  Confirmed plan for next specialty medication refill: delivery by pharmacy  Refills needed for supportive medications: not needed          Were doses missed due to medication being on hold? No    Abiraterone 250 mg: 10-14 days of medicine on hand     REFERRAL TO PHARMACIST     Referral to the pharmacist: Not needed      [facility]     Shipping address confirmed in Epic.       Delivery Scheduled: Yes, Expected medication delivery date: 04/10/23.     Medication will be delivered via Next Day Courier to the prescription address in [place].    Patrick Montgomery, PharmD   [facility] Specialty Pharmacist

## 2023-04-09 MED FILL — ABIRATERONE 250 MG TABLET: ORAL | 30 days supply | Qty: 120 | Fill #9

## 2023-04-09 MED FILL — PREDNISONE 5 MG TABLET: ORAL | 30 days supply | Qty: 30 | Fill #2

## 2023-04-15 NOTE — Unmapped (Signed)
OUTPATIENT ONCOLOGY PALLIATIVE CARE CONSULT NOTE    Patrick Montgomery is a 62 y.o. male who is seen in consultation at the request of Patrick Montgomery* for evaluation of Symptoms     Principal Diagnosis: Patrick Montgomery is a 62 y.o. male with prostate cancer, diagnosed in June 2023 now on ADT with Eliguard.  Disease sites include prostate, ?solitary bone metastasis.     Assessment/Plan:     #Cancer-Related Pain: STable to slightly improved.  Severe pain in the right perineal area.  Unclear if cancer related although this does seem to correlate with CT finding of increased conspicuity of right inferior pubic ramus mixed lytic sclerotic lesion.  Unclear if pain is from this point disease versus radiation changes.  May also have neuropathic component given reported intermittent radiating pain down right leg and up his back.  - Cont Xtampza 9mg  BID  - Refill oxycodone to 5-10 mg q4 hrs PRN --> goal of no more than 6 tablets per day as patient trying to wean off.   - Cont tylenol 1000 mg TID PRN  - Refill flexeril 10mg  nightly PRN  - Cont Lidocaine ointment to tender area of skin  - Future considerations: Pelvic PT vs Consider reimaging of pelvis/perineum if pain worsens or persist vs trial of neuropathic agents    Prior Medications:   - MS contin - didn't find effective.  Oxycodone 5 mg effective.  - Naproxen BID - mild benefit  - Tylenol - mild to no benefit    #Cancer-Related Fatigue: Improved and stable. Still able to be active. Gets out of house daily.   - Encouraged light exercise as able    #Early Satiety: Ongoing. Eats every meal, jsut not large quantity. Snacks as well.   - If no improvement, patinet open to nutrition referral next visit  - CTM, possible marinol trial in future     #Constipation: Improved, but not resolved.    - Cont senna 1 tablet nightly, low threshold to increase to 2 tablets  -Continue MiraLAX 1-2 times daily  - Low threshold to escalate regimen vs. Start naloxegol    #Mood/Coping: Stable. Good support from his wife, mother and children. Stays positive by nature.     #Goals of Care/Prognostic Awareness: Provided patient with prepare for your care paperwork and introduced the concepts of CODE STATUS and the purpose of advance directives.  Patient appreciated having something to take at home and complete with his Montgomery.     From prior convos:  - Knows he has prostate cancer and is followed by Patrick Montgomery.  Knows that recent PSA was good.  Understands that he is taking medicine through 2026 for this.  Unsure if his disease is curable or not and what the overall trajectory of his prognosis is, but would like to know.  Is a type of person who would want to know details about his disease and prognosis going forward.  - Patient shares his understanding that he has metastatic prostate cancer. He does not recall much about what to expect from this cancer, including about prognosis. He knows that there are several treatment options, but is not sure whether it is curable or not.   - We asked whether he is the type of person who wants to know details about prognosis and he reports that he would like to know. He appreciates honest and direct communication. We said that we would discuss with oncology so that we can provide accurate communication.   - Finally,  we asked who his preferred HCDM is and he says that it is his domestic Montgomery, Patrick Montgomery. We discussed that she would not be default HCPOA by state law and recommended completing formal HCPOA paperwork to allow her to be his surrogate if needed in the future.     #Advance Care Planning:  HCDM:   HCDM (patient stated preference): Patrick Montgomery [phone]  Natural surrogate decision maker: majority of adult children and living parents  Advance Directive: none on file - wants to do HCPOA      #Controlled substances risk management.   - Patient has a signed pain medication agreement with Outpatient Palliative Care, completed on 01/28/23, as per standard of care.   - NCCSRS database was reviewed today and it was appropriate.   - Urine drug screen was performed at this visit. Findings: periodic screening has been appropriate.   - Patient has received information about safe storage and administration of medications.   - Patient has received a prescription for narcan and patient is not applicable.     F/u: 4-6 weeks    ----------------------------------------    Referring Provider: Ortencia Montgomery  Oncology Team: Patrick Montgomery  PCP: Patrick Lever, Patrick Montgomery    Interval Hx, 04/16/23:    Patient reports that he is doing okay today.  He shares that his dog knocked his bottle of oxycodone out of his hand a week ago so he has been out of pain medication for a week. He had not been taking xtampza but started to after that incident.  At first, the pain was pretty severe, but he reports that over the last 24 to 48 hours, it is eased up a bit.  The pain remains in his right perineum region.  It is worse with certain movements and prolonged sitting.     Otherwise, patient reports that he has been doing well overall.  He has good energy and is able to maintain a decent amount of activity.    He is in good spirits.  No concerns for depression or anxiety.    On ROS, he does report intermittent constipation if he is not diligent about his bowel regimen.  He also notes that while he has an appetite, he continues to have early satiety and is continue to lose weight.    No n/v or diarrhea.     HPI: Patrick Montgomery is a 62 year old male with history of prostate cancer establishing with palliative care today primarily for pain control.    In terms of pain, patient locates the pain just to the right of his rectum, sometimes with radiation down his leg and up his back. He describes the pain as a sharp and excruciating pain. He notes that it is constant. It is worse after moving around all day and worse when sitting down.  It feels better if he massages the area. 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    Palliative Performance Scale: 60% - Ambulation: Reduced / unable to do hobby or some housework, significant disease / Self-Care:Occasional assist as necessary / Intake:Normal or reduced / Level of Conscious: Full or confusion    Social History:   Lives in [place], [place] with his domestic Montgomery, Belenda Cruise (not officially married). They have an 18yo daughter. Also has a 60yo son who runs a comic book store. Mother is living and also a  source of support.    Watches church every Sunday - [employer]. Also belongs to a [employer] church.     Occupation: Retired house painter  Hobbies: Likes to race go-karts and cars. Likes to watch his nephew do this as well.     Objective     Hematology/Oncology History Overview Note   In 03/2022, seen in ER for gross hematuria, worsening LUTS. CT bladder showing bladder mass.  In 04/2022, PSA 18. Had cysto, prostate biopsy, TURP. Prostate biopsy showed Gleason 4+5=9 (GG 5), 10/12 cores positive for cancer. TURP specimen showed mixed acinar/ductal features, cribriborm morphology.  In 05/2022, CT and bone scan showed no pelvic or distant mets  In 06/2022, ADT started with Degarelix, continued with Eligard.  In 06/2022, PSMA PET showed avidity in R inferior pubic ramus, suspicious for single bone met.  In 07/2022, abiraterone/prednisone started.  In 08/2022, RT to prostate/SV/nodes and pubic ramus bone met site, completed in 12/2022.     Prostate cancer (CMS-HCC)   06/17/2022 Initial Diagnosis    Prostate cancer (CMS-HCC)     06/17/2022 -  Cancer Staged    Staging form: Prostate, AJCC 8th Edition  - Clinical: Stage IIIC (cT4, cN0, cM0, PSA: 18, Grade Group: 5) - Signed by Young E Whang, MD on 06/19/2022       07/07/2022 - 07/07/2022 Endocrine/Hormone Therapy    OP DEGARELIX  Plan Provider: Young E Whang, MD     08/11/2022 Endocrine/Hormone Therapy    OP PROSTATE LEUPROLIDE (ELIGARD) 45 MG EVERY 6 MONTHS  Plan Provider: Young E Whang, MD     10 /10/2022 -  Radiation    Radiation Therapy Treatment Details (Noted on 09/10/2022)  Site: Prostate  Technique: IMRT  Goal: No goal specified  Planned Treatment Start Date: No planned start date specified       REVIEW OF SYSTEMS:  A comprehensive review of 10 systems was negative except for pertinent positives noted in HPI.    PHYSICAL EXAM:   GEN: NAD  CV: no evidence of cyanosis  PULM: CTAB, No increased work of breathing  ABD: soft, NT/ND, +BS  MSK: mild sarcopenia  EXT: No LE edema  NEURO: AO x 3, No gross focal deficits, 5/5 strength throughout, no myoclonus  PSYCH: mood and affect appropriate, no agitation            I personally spent 40 minutes face-to-face and non-face-to-face in the care of this patient, which includes all pre, intra, and post visit time on the date of service.  All documented time was specific to the E/M visit and does not include any procedures that may have been performed.        Redgie Grayer, MD, MEd  [facility] Outpatient Oncology Palliative Care

## 2023-04-16 ENCOUNTER — Ambulatory Visit
Admit: 2023-04-16 | Discharge: 2023-04-17 | Payer: PRIVATE HEALTH INSURANCE | Attending: Student in an Organized Health Care Education/Training Program | Primary: Student in an Organized Health Care Education/Training Program

## 2023-04-16 MED ORDER — ATORVASTATIN 10 MG TABLET
ORAL_TABLET | Freq: Every day | ORAL | 0 refills | 90 days | Status: CP
Start: 2023-04-16 — End: ?

## 2023-04-17 MED ORDER — OXYCODONE 5 MG TABLET
ORAL_TABLET | ORAL | 0 refills | 14 days | Status: CP | PRN
Start: 2023-04-17 — End: ?

## 2023-05-11 NOTE — Unmapped (Signed)
[facility] Refill Coordination Note    Specialty Medication(s) to be Shipped:   Hematology/Oncology: abiraterone 250mg     Other medication(s) to be shipped:  Prednisone 5MG      Patrick Montgomery, DOB: Feb 10, 1961  Phone: [phone] (home)       All above HIPAA information was verified with patient.     Was a Nurse, learning disability used for this call? No    Completed refill call assessment today to schedule patient's medication shipment from the [facility] [phone]).  All relevant notes have been reviewed.     Specialty medication(s) and dose(s) confirmed: Regimen is correct and unchanged.   Changes to medications: [name] reports no changes at this time.  Changes to insurance: No  New side effects reported not previously addressed with a pharmacist or physician: None reported  Questions for the pharmacist: No    Confirmed patient received a Conservation officer, historic buildings and a Surveyor, mining with first shipment. The patient will receive a drug information handout for each medication shipped and additional FDA Medication Guides as required.       DISEASE/MEDICATION-SPECIFIC INFORMATION        N/A    SPECIALTY MEDICATION ADHERENCE     Medication Adherence    Patient reported X missed doses in the last month: 0  Specialty Medication: Abiraterone 250mg   Patient is on additional specialty medications: Yes  Additional Specialty Medications: Prednisone 5mg   Patient Reported Additional Medication X Missed Doses in the Last Month: 0  Patient is on more than two specialty medications: No  Informant: patient          Were doses missed due to medication being on hold? No    Abiraterone 250 mg: 1 days of medicine on hand   Prednisone 5 MG : unsure; at least a week    REFERRAL TO PHARMACIST     Referral to the pharmacist: Not needed      [facility]     Shipping address confirmed in Epic.       Delivery Scheduled: Yes, Expected medication delivery date: 05/12/23.     Medication will be delivered via Same Day Courier to the prescription address in Epic WAM.    Patrick Montgomery   [facility] Specialty Technician

## 2023-05-12 MED FILL — PREDNISONE 5 MG TABLET: ORAL | 30 days supply | Qty: 30 | Fill #3

## 2023-05-12 MED FILL — ABIRATERONE 250 MG TABLET: ORAL | 30 days supply | Qty: 120 | Fill #10

## 2023-05-17 NOTE — Unmapped (Signed)
[facility] Visit Note    Patient Name: Patrick Montgomery  Patient Age: 62 y.o.  Encounter Date: 05/18/2023  Attending Provider:  Hamza Empson E. 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    Patrick Montgomery is a 62 y.o. male who presents for management of newly diagnosed prostate cancer. He was initially seen in the ER In May 2023 for gross hematuria and  worsening LUTS. A CT scan of the bladder revealed a bladder mass. In June 2023, he was found to have a PSA of 18. He underwent cystoscopy, prostate biopsy, and TURP. Prostate biopsy showed Gleason 4+5=9 (GG 5), 10/12 cores positive for cancer. TURP specimen showed mixed acinar/ductal features, cribriborm morphology. In July 2023, CT and bone scan showed no pelvic or distant metastases.    On 06/18/22, we discussed management of his recent diagnosis of prostate cancer. Given that he has been diagnosed with prostate cancer at a very Kaitlin Ardito age, I informed the patient that there may be features that indicate a higher risk of hereditary prostate cancer. 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   Based on current imaging, his cancer is confined to the prostate. A PSMA PET scan will be ordered to see if the cancer has metastasized.     On 07/07/2022, pt is now ready to move ahead with treatment. Will proceed with ADT plus abiraterone for two years, per STAMPEDE.  PSMA PET still undergoing authorization.    On 08/11/2022, most recent PSA on 08/01/2022 is 0.27. PSMA PET scan from 07/27/2022 showed avidity involving the right inferior pubic ramus which is suspicious for single site of osseous metastatic disease. Will proceed with Eligard shot and proceed with radiation with Dr. Carles Collet. May consider radiating single site of metastatic disease at the right inferior pubic ramus     On 12/03/2022, PSA <0.04, on ADT plus abi, s/p RT to all involved sites. For pain, unlikely to be from cancer, may benefit from pain clinic management long-term.    On 02/16/23, PSA remains <0.04. Received ADT today and will continue abiraterone/prednisone. BP mildly elevated at 146/81, will monitor. Chronic pain being addressed with support from palliative care team. Will RTC in 3 months.    Today on 05/18/2023, PSA remains undetectable. BP elevated at 166/72, potassium 3.2 today. Hold abiraterone for now, will have CPP Katina Degree, Pharm D follow up on these and restart abi (with or without dose reduction).     Plan:  Continue ADT with Eligard, given last on 02/16/2023, due next on 08/19/2023. Anticipate 3 years of therapy (till 8-07/2025)  --Follow PSA  HOLD Abiraterone due to elevated BP and low K for now. Continue prednisone   Potassium supplement prescribed  Follow labs/BP  Follow up with CPP and restart abi when hypertension/hypokalemia resolved  3. NGS returned without actionable mutations, germline testing negative  on [facility] normal  4. RTC 3 months    I personally spent 40 minutes face-to-face and non-face-to-face in the care of this patient, which includes all pre, intra, and post visit time on the date of service.  All documented time was specific to the E/M visit and does not include any procedures that may have been performed.      Reason for Visit  Follow up of prostate cancer.     History of Present Illness:  Hematology/Oncology History Overview Note   In 03/2022, seen in ER for gross hematuria, worsening LUTS. CT bladder showing bladder mass.  In 04/2022, PSA 18. Had cysto, prostate biopsy, TURP. Prostate biopsy showed Gleason 4+5=9 (GG 5), 10/12 cores positive for cancer. TURP specimen showed mixed acinar/ductal features, cribriborm morphology.  In 05/2022, CT and bone scan showed no pelvic or distant mets  In 06/2022, ADT started with Degarelix, continued with Eligard.  In 06/2022, PSMA PET showed avidity in R inferior pubic ramus, suspicious for single bone met.  In 07/2022, abiraterone/prednisone started.  In 08/2022, RT to prostate/SV/nodes and pubic ramus bone met site, completed in 12/2022.     Prostate cancer (CMS-HCC)   06/17/2022 Initial Diagnosis    Prostate cancer (CMS-HCC)     06/17/2022 -  Cancer Staged    Staging form: Prostate, AJCC 8th Edition  - Clinical: Stage IIIC (cT4, cN0, cM0, PSA: 18, Grade Group: 5) - Signed by Maurie Boettcher, MD on 06/19/2022       07/07/2022 - 07/07/2022 Endocrine/Hormone Therapy    OP DEGARELIX  Plan Provider: Maurie Boettcher, MD     08/11/2022 Endocrine/Hormone Therapy    OP PROSTATE LEUPROLIDE (ELIGARD) 45 MG EVERY 6 MONTHS  Plan Provider: Maurie Boettcher, MD     09/10/2022 -  Radiation    Radiation Therapy Treatment Details (Noted on 09/10/2022)  Site: Prostate  Technique: IMRT  Goal: No goal specified  Planned Treatment Start Date: No planned start date specified         Interval history  The patient returns for scheduled follow up, unaccompanied. He is doing generally well. Continued chronic rectal pain, ran out of oxycodone. No issues with medication, no other complaints noted.       Allergies:  No Known Allergies    Current Medications:    Current Outpatient Medications:     abiraterone (ZYTIGA) 250 mg tablet, Take 4 tablets (1,000 mg total) by mouth daily., Disp: 120 tablet, Rfl: 11    acetaminophen (TYLENOL EXTRA STRENGTH) 500 MG tablet, Take 2 tablets (1,000 mg total) by mouth two (2) times a day as needed for pain., Disp: 100 tablet, Rfl: 2    amlodipine (NORVASC) 5 MG tablet, Take 1 tablet (5 mg total) by mouth daily., Disp: 90 tablet, Rfl: 3    aspirin (ECOTRIN) 81 MG tablet, Take 1 tablet (81 mg total) by mouth daily., Disp: 90 tablet, Rfl: 3    atorvastatin (LIPITOR) 10 MG tablet, Take 1 tablet (10 mg total) by mouth daily., Disp: 90 tablet, Rfl: 0    cyclobenzaprine (FLEXERIL) 10 MG tablet, Take 1 tablet (10 mg total) by mouth nightly as needed for muscle spasms., Disp: 30 tablet, Rfl: 3    lidocaine (XYLOCAINE) 5 % ointment, Apply to affected area daily, Disp: 37 g, Rfl: 0    naloxone (NARCAN) 4 mg nasal spray, One spray in either nostril once for known/suspected opioid overdose. May repeat every 2-3 minutes in alternating nostril til EMS arrives, Disp: 2  each, Rfl: 3    oxybutynin (DITROPAN) 5 MG tablet, Take 1 tablet (5 mg total) by mouth Three (3) times a day., Disp: 90 tablet, Rfl: 11    polyethylene glycol (MIRALAX) 17 gram packet, Take 17 g by mouth two (2) times a day., Disp: 100 packet, Rfl: 2    predniSONE (DELTASONE) 5 MG tablet, Take 1 tablet (5 mg total) by mouth daily., Disp: 14 tablet, Rfl: 0    predniSONE (DELTASONE) 5 MG tablet, Take 1 tablet (5 mg total) by mouth daily., Disp: 30 tablet, Rfl: 11    tamsulosin (FLOMAX) 0.4 mg capsule, Take 1 capsule (0.4 mg total) by mouth daily., Disp: 30 capsule, Rfl: 11    gabapentin (NEURONTIN) 300 MG capsule, Take 1 capsule (300 mg total) by mouth nightly., Disp: , Rfl:     oxyCODONE (ROXICODONE) 5 MG immediate release tablet, Take 1-2 tablets (5-10 mg total) by mouth every four (4) hours as needed for pain. No more than 7 tablets per day., Disp: 196 tablet, Rfl: 0    potassium chloride (KLOR-CON) 10 MEQ CR tablet, Take 1 tablet (10 mEq total) by mouth daily., Disp: 30 tablet, Rfl: 3    SENNA 8.6 mg tablet, Take 2 tablets by mouth nightly., Disp: 180 tablet, Rfl: 3    Past Medical History and Social History  Past Medical History:   Diagnosis Date    Anxiety     Arthritis     Basal cell carcinoma     Cancer (CMS-HCC)     skin    Gout     Kidney stones     Squamous cell skin cancer     Stroke (CMS-HCC)       Past Surgical History:   Procedure Laterality Date    PR BIOPSY OF PROSTATE,NEEDLE/PUNCH N/A 05/21/2022    Procedure: IMAGE GUIDED BIOPSY, PROSTATE; NEEDLE OR PUNCH, SINGLE OR MULTIPLE, ANY APPROACH;  Surgeon: Phillips Grout, MD;  Location: [facility] OR [facility];  Service: Urology    PR TRANSURETHRAL ELEC-SURG PROSTATECTOM N/A 05/21/2022    Procedure: TRANSURETHRAL ELECTROSURGICAL RESECT PROSTATE, W/CONTORL POSTOP BLEED, COMPLETE (VASECT, MEATOT, CYSTO ETC);  Surgeon: Phillips Grout, MD;  Location: [facility] OR [facility];  Service: Urology    SKIN BIOPSY      skin cancer removal           Social History     Occupational History    Not on file   Tobacco Use    Smoking status: Every Day     Current packs/day: 1.50     Average packs/day: 1.5 packs/day for 45.0 years (67.5 ttl pk-yrs)     Types: Cigarettes    Smokeless tobacco: Never   Vaping Use    Vaping status: Never Used   Substance and Sexual Activity    Alcohol use: No     Alcohol/week: 0.0 standard drinks of alcohol     Comment: socially     Drug use: Yes     Types: Marijuana    Sexual activity: Not Currently       Family History  Family History   Problem Relation Age of Onset    Cancer Father         Skin    Cancer Brother         skin     Substance Abuse Disorder Neg Hx      Prostate Cancer Family History Assessment:  History of cancer in children (yes/no; if yes, what type AND age  of diagnosis): No  Total number of siblings (including deceased): 2  History of cancer in siblings (yes/no; if yes, provide relation, type of cancer, AND age of diagnosis): Brother superficial basal cell; age unknown  History of cancer in parents (yes/no; if yes, please specify parent, type of cancer, AND age of diagnosis): Father superficial basal cell carcinoma; age unknown  History of cancer in aunts/uncles/grandparents (yes/no; if yes, provide relation, type of cancer, AND age of diagnosis): No      Review of Systems:  A comprehensive review of 10 systems was negative except for pertinent positives noted in HPI.    Physical Exam:    VITAL SIGNS:  BP 166/88  - Pulse 54  - Temp 36.5 ??C (97.7 ??F) (Oral)  - Resp 18  - Ht 167.6 cm (5' 6)  - Wt 60.7 kg (133 lb 12.8 oz)  - SpO2 100%  - BMI 21.60 kg/m??   ECOG Performance Status: 1  GENERAL: Well-developed, well-nourished patient in no acute distress.  HEAD: Normocephalic and atraumatic.  EYES: Conjunctivae are normal. No scleral icterus.  MOUTH/THROAT: Oropharynx is clear and moist.  No mucosal lesions.  NECK: Supple, no thyromegaly.  LYMPHATICS: No palpable cervical, supraclavicular, or axillary adenopathy.  CARDIOVASCULAR: Normal rate, regular rhythm and normal heart sounds.  Exam reveals no gallop and no friction rub.  No murmur heard.  PULMONARY/CHEST: Effort normal and breath sounds normal. No respiratory distress.  ABDOMINAL:  Soft. There is no distension. There is no tenderness. There is no rebound and no guarding.  MUSCULOSKELETAL: No clubbing, cyanosis, or lower extremity edema.  PSYCHIATRIC: Alert and oriented.  Normal mood and affect.  NEUROLOGIC: No focal motor deficit. Normal gait.  SKIN: Skin is warm, dry, and intact.      Results/Orders:    Lab on 05/18/2023   Component Date Value Ref Range Status    PSA 05/18/2023 <0.04  0.00 - 4.00 ng/mL Final    Potassium 05/18/2023 3.2 (L)  3.5 - 5.1 mmol/L Final    Albumin 05/18/2023 3.4  3.4 - 5.0 g/dL Final    Total Protein 05/18/2023 6.0  5.7 - 8.2 g/dL Final    Total Bilirubin 05/18/2023 0.5  0.3 - 1.2 mg/dL Final    Bilirubin, Direct 05/18/2023 0.10  0.00 - 0.30 mg/dL Final    AST 16/08/9603 13  <=34 U/L Final    ALT 05/18/2023 11  10 - 49 U/L Final    Alkaline Phosphatase 05/18/2023 90  46 - 116 U/L Final       Lab Results   Component Value Date    PSA <0.04 05/18/2023    PSA <0.04 02/16/2023    PSA <0.04 01/07/2023    PSA <0.04 12/03/2022    PSA <0.04 10/30/2022    PSA <0.04 10/16/2022    PSA 0.09 10/05/2022    PSA 0.04 09/21/2022    PSA 0.09 09/07/2022    PSA 0.14 08/21/2022         Orders placed or performed in visit on 05/18/23    Clinic Appointment Request Physician, Lab    Clinic Appointment Request Physician, Pharmacist/CPP, Injection    LAB Appointment Request       Molecular Pathology  Tumor mutation profiling  [facility]: ZMYM3 mutation, TMB 3.7, MSS, no actionable mutations    Germline testing  Negative on [facility] normal draw    Pathology:  A: Prostate, right apex, core biopsy  - Prostatic adenocarcinoma, Gleason score 4 + 4 = 8 (Grade group 4)  involving 2 of 2 cores, approximately 4 and 1 mm in linear extent, approximately 20% of total core length.      B: Prostate, left apex, core biopsy  - Prostatic adenocarcinoma, Gleason score 4 + 4 = 8 (Grade group 4) involving 2 of 2 cores, approximately 7 and 5 mm in linear extent, approximately 60% of total core length.   - Perineural invasion identified in this case.      C: Prostate, left mid, core biopsy  - Prostatic adenocarcinoma, Gleason score 4 + 5 = 9 (Grade group 5) involving 2 of 2 cores, approximately 6 and 3mm in linear extent, approximately 90% of total core length.      D: Prostate, right mid, core biopsy  - Prostatic adenocarcinoma, Gleason score 4 + 5 = 9 (Grade group 5) involving 2 of 2 cores, approximately 6 and 5 mm in linear extent, approximately 70% of total core length.      E: Prostate, right base, core biopsy  - Benign prostate tissue.      F: Prostate, left base, core biopsy  - Prostatic adenocarcinoma, Gleason score 4 + 4 = 8 (Grade group 4)  involving 2 of 2 cores, approximately 3 and 1 mm in linear extent, approximately 20% of total core length.      G: Bladder neck and bilateral median lobe, transurethral resection  - Involved by prostatic adenocarcinoma with mixed acinar and ductal features, Gleason score 4 + 4 = 8 (Grade group 4)    - Lymphovascular invasion identified     The pattern 4 in this case demonstrates cribriform morphology.      Imaging results:    CT Chest w Contrast 06/11/22:   No intrathoracic metastasis.       CT Abdomen Pelvis W Contrast 06/11/22:     Enlarged irregular prostate correlates with history of biopsy-proven prostate cancer. There is redemonstrated irregularity of the posterior bladder and increased irregularity of the prostate at the bladder margin compared to prior may be secondary to tumor versus postsurgical/post TURP change. Consider attention on follow-up versus further evaluation with PSMA PET scan.       Questionable sclerotic changes of the right inferior pubic ramus are indeterminate and may represent normal variation although metastatic osseous disease is not excluded. Attention on follow-up bone scan. There is no other evidence of metastatic disease in the abdomen/pelvis.       Additional chronic and incidental findings as described.       Please see same day CT chest report for detailed findings above the diaphragm     NM Bone Scan Whole Body 06/11/22  No findings suggestive of osseous metastatic disease     PET CT 07/27/2022:   Impression   1.Ill-defined tracer avidity associated with the prostate, left greater than right, likely representing biopsy-proven prostate carcinoma.   2.Moderate avidity involving the right inferior pubic ramus which is suspicious for single site of osseous metastatic disease. Otherwise, no definite evidence of additional metastatic disease. More specifically, no avid adenopathy. Subcentimeter pulmonary nodules which are below the resolution of PET/CT and require continued attention on follow-up.     I attest that I, Shearon Stalls, personally documented this note while acting as scribe for Maurie Boettcher, MD       Shearon Stalls, Scribe.  05/18/2023       Documentation assistance provided by Medical Scribe, Shearon Stalls, who was present during the entirety of the visit. I reviewed the note below and validated all  of the information provided to ensure accuracy and completeness.      Maurie Boettcher, MD

## 2023-05-17 NOTE — Unmapped (Signed)
RADIATION ONCOLOGY TREATMENT COMPLETION NOTE    Encounter Date: 11/10/2022  Patient Name: Patrick Montgomery  Medical Record Number: [id number]    Referring Physician: No referring provider defined for this encounter.    Primary Care Provider: Deneise Lever, FNP    DIAGNOSIS:  [phone] with high risk prostate cancer (PSA 18, Gleason 4+5 in 10/12 cores) with extension into the bladder neck and a solitary PSMA PET-avid lesion in the R pubic ramus concerning for oligometastatic disease     TREATMENT INTENT: curative    CLINICAL TRIAL: no    CHEMOTHERAPY: ADT and abi    RADIATION TREATMENT SUMMARY:          Treatment site Treatment Technique/Modality Energy Dose per fraction Total number  of fractions Total dose Start date End date   Prostate/SV and pubic ramus IMRT 10 MV 250 cGy 28 7000 cGy 09/28/2022   11/10/2022   Pelvic LN IMRT 10 MV 180 cGy 28 5040 cGy 09/28/2022 11/10/2022         COMPLETED INTENDED COURSE:  Yes    TREATMENT BREAK > 2 WEEKS:  No    TOLERANCE TO TREATMENT:  Moderate toxicities or complications requiring outpatient intervention(s)    FEEDING TUBE:  no    PLAN FOR FOLLOW-UP: Jagr Popp is to return for follow up with me/our group in 3 months      Genia Plants, MD  05/17/23 10:31 AM

## 2023-05-18 ENCOUNTER — Other Ambulatory Visit: Admit: 2023-05-18 | Discharge: 2023-05-18 | Payer: PRIVATE HEALTH INSURANCE

## 2023-05-18 ENCOUNTER — Ambulatory Visit
Admit: 2023-05-18 | Discharge: 2023-05-18 | Payer: PRIVATE HEALTH INSURANCE | Attending: Student in an Organized Health Care Education/Training Program | Primary: Student in an Organized Health Care Education/Training Program

## 2023-05-18 ENCOUNTER — Ambulatory Visit: Admit: 2023-05-18 | Discharge: 2023-05-18 | Payer: PRIVATE HEALTH INSURANCE

## 2023-05-18 ENCOUNTER — Ambulatory Visit
Admit: 2023-05-18 | Discharge: 2023-05-18 | Payer: PRIVATE HEALTH INSURANCE | Attending: Hematology & Oncology | Primary: Hematology & Oncology

## 2023-05-18 DIAGNOSIS — Z515 Encounter for palliative care: Principal | ICD-10-CM

## 2023-05-18 DIAGNOSIS — G893 Neoplasm related pain (acute) (chronic): Principal | ICD-10-CM

## 2023-05-18 DIAGNOSIS — C61 Malignant neoplasm of prostate: Principal | ICD-10-CM

## 2023-05-18 DIAGNOSIS — R530 Neoplastic (malignant) related fatigue: Principal | ICD-10-CM

## 2023-05-18 DIAGNOSIS — E876 Hypokalemia: Principal | ICD-10-CM

## 2023-05-18 DIAGNOSIS — K5903 Drug induced constipation: Principal | ICD-10-CM

## 2023-05-18 LAB — HEPATIC FUNCTION PANEL
ALBUMIN: 3.4 g/dL (ref 3.4–5.0)
ALKALINE PHOSPHATASE: 90 U/L (ref 46–116)
ALT (SGPT): 11 U/L (ref 10–49)
AST (SGOT): 13 U/L (ref <=34)
BILIRUBIN DIRECT: 0.1 mg/dL (ref 0.00–0.30)
BILIRUBIN TOTAL: 0.5 mg/dL (ref 0.3–1.2)
PROTEIN TOTAL: 6 g/dL (ref 5.7–8.2)

## 2023-05-18 LAB — POTASSIUM: POTASSIUM: 3.2 mmol/L — ABNORMAL LOW (ref 3.5–5.1)

## 2023-05-18 LAB — PSA: PROSTATE SPECIFIC ANTIGEN: <0.04 ng/mL (ref 0.00–4.00)

## 2023-05-18 MED ORDER — POTASSIUM CHLORIDE ER 10 MEQ TABLET,EXTENDED RELEASE
ORAL_TABLET | Freq: Every day | ORAL | 3 refills | 30 days | Status: CP
Start: 2023-05-18 — End: ?

## 2023-05-18 MED ORDER — OXYCODONE 5 MG TABLET
ORAL_TABLET | ORAL | 0 refills | 17 days | Status: CP | PRN
Start: 2023-05-18 — End: ?

## 2023-05-18 MED ORDER — SENNOSIDES 8.6 MG TABLET
ORAL_TABLET | Freq: Every evening | ORAL | 3 refills | 90 days | Status: CP
Start: 2023-05-18 — End: ?

## 2023-05-18 NOTE — Unmapped (Signed)
OUTPATIENT ONCOLOGY PALLIATIVE CARE CONSULT NOTE    Patrick Montgomery is a 62 y.o. male who is seen in consultation at the request of [name] for evaluation of Symptoms     Principal Diagnosis: Patrick Montgomery is a 62 y.o. male with prostate cancer, diagnosed in June 2023 now on ADT with Eliguard.  Disease sites include prostate, ?solitary bone metastasis.     Assessment/Plan:     #Cancer-Related Pain: Worse. Severe pain in the right perineal area.  Unclear if cancer related although this does seem to correlate with CT finding of increased conspicuity of right inferior pubic ramus mixed lytic sclerotic lesion.  Unclear if pain is from this is disease versus radiation changes.  Now describing more of a neuropathic burning sensation with radiation down leg.   - hold xtampza as has been opioid free x2 weeks - discussed importance of communicating proactively if pain is worsening or if running out of meds  - Refill oxycodone to 5-10 mg q4 hrs PRN --> no more than 7 tablets per day  - Start gabapentin 300mg  nightly with plan to increase to TID if effective and minimal side effects  - Cont tylenol 1000 mg TID PRN  - Refill flexeril 10mg  nightly PRN  - Cont Lidocaine ointment to tender area of skin  - Future considerations: Pelvic PT vs Consider reimaging of pelvis/perineum if pain worsens or persist    Prior Medications:   - MS contin - didn't find effective.  Oxycodone 5 mg effective.  - Naproxen BID - mild benefit  - Tylenol - mild to no benefit    #Cancer-Related Fatigue: Stable. Still able to be active. Gets out of house daily.   - Encouraged light exercise as able    #Early Satiety: Improving. Weight stable  - If worsens, patinet open to nutrition referral in future  - CTM, possible marinol trial in future     #Constipation: Slightly worse - having to strain more and causes discomfort.  - increase senna to 2 tablet nightly  -Continue MiraLAX 1-2 times daily  - Low threshold to escalate regimen vs. Start naloxegol    #Mood/Coping: Stable.  Good support from his wife, mother and children. Stays positive by nature. Says he would be perfect if not for lingering pain.     #Goals of Care/Prognostic Awareness: Provided patient with prepare for your care paperwork and introduced the concepts of CODE STATUS and the purpose of advance directives.  Patient appreciated having something to take at home and complete with his partner. Hasn't returned this to clinic yet    From prior convos:  - Knows he has prostate cancer and is followed by Dr. Regino Schultze.  Knows that recent PSA was good.  Understands that he is taking medicine through 2026 for this.  Unsure if his disease is curable or not and what the overall trajectory of his prognosis is, but would like to know.  Is a type of person who would want to know details about his disease and prognosis going forward.  - Patient shares his understanding that he has metastatic prostate cancer. He does not recall much about what to expect from this cancer, including about prognosis. He knows that there are several treatment options, but is not sure whether it is curable or not.   - We asked whether he is the type of person who wants to know details about prognosis and he reports that he would like to know. He appreciates honest and direct communication.  We said that we would discuss with oncology so that we can provide accurate communication.   - Finally, we asked who his preferred HCDM is and he says that it is his domestic partner, Patrick Montgomery. We discussed that she would not be default HCPOA by state law and recommended completing formal HCPOA paperwork to allow her to be his surrogate if needed in the future.     #Advance Care Planning:  HCDM:   HCDM (patient stated preference): Patrick Montgomery - Domestic Partner [phone]  Natural surrogate decision maker: majority of adult children and living parents  Advance Directive: none on file - wants to do HCPOA      #Controlled substances risk management.   - Patient has a signed pain medication agreement with Outpatient Palliative Care, completed on 01/28/23, as per standard of care.   - [place] database was reviewed today and it was appropriate.   - Urine drug screen was performed at this visit. Findings: periodic screening has been appropriate.   - Patient has received information about safe storage and administration of medications.   - Patient has received a prescription for narcan and patient is not applicable.     F/u: 4-6 weeks    ----------------------------------------    Referring Provider: Ortencia Kick  Oncology Team: [name]  PCP: Deneise Lever, FNP    Interval Hx, 05/18/23:    Patient reports that he is doing only okay.  He continues to have persistent pain in his right perineal region.  He shares that this pain has been the same or worse since her last visit and it has developed more of a burning-like sensation with radiation down his leg. He also notes that it is worse when he is sitting and worse when he is having a BM (especially if constipated).  Because the pain seem to flare, he reports that he ran out of oxycodone early.  He also ran out of Xtampza and did not reach out for refill even though he was due. Has not taken opioids in 2 weeks as a result.     This is really the primary issue that gives him trouble.  It is causing difficulty with his sleep and therefore impacting his energy a little bit.  However, overall, he reports that he has pretty good energy and has been able to remain really active, even with this pain.  He also shares that he has had an improvement in his appetite and his weight has been stable.  He is not having any nausea or vomiting.    On ROS, he is having intermittent constipation which forces him to strain and causes increased discomfort. He also notes occasional blurriness with his eyes which makes him feel dizzy.     He has no concerns about his mood and reports that he is in good spirits.    HPI: Patrick Montgomery is a 62 year old male with history of prostate cancer establishing with palliative care today primarily for pain control.    In terms of pain, patient locates the pain just to the right of his rectum, sometimes with radiation down his leg and up his back. He describes the pain as a sharp and excruciating pain. He notes that it is constant. It is worse after moving around all day and worse when sitting down.  It feels better if he massages the area.  He used to work as a Education administrator for 40 years, but has not been able to due to this pain (as  well as right rotator cuff issues).    Palliative Performance Scale: 60% - Ambulation: Reduced / unable to do hobby or some housework, significant disease / Self-Care:Occasional assist as necessary / Intake:Normal or reduced / Level of Conscious: Full or confusion    Social History:   Lives in [place], [place] with his domestic partner, Patrick Montgomery (not officially married). They have an 18yo daughter. Also has a 26yo son who runs a comic book store. Mother is living and also a source of support.    Watches church every Sunday - [employer]. Also belongs to a [employer] church.     Occupation: Retired house painter  Hobbies: Likes to race go-karts and cars. Likes to watch his nephew do this as well.     Objective     Hematology/Oncology History Overview Note   In 03/2022, seen in ER for gross hematuria, worsening LUTS. CT bladder showing bladder mass.  In 04/2022, PSA 18. Had cysto, prostate biopsy, TURP. Prostate biopsy showed Gleason 4+5=9 (GG 5), 10/12 cores positive for cancer. TURP specimen showed mixed acinar/ductal features, cribriborm morphology.  In 05/2022, CT and bone scan showed no pelvic or distant mets  In 06/2022, ADT started with Degarelix, continued with Eligard.  In 06/2022, PSMA PET showed avidity in R inferior pubic ramus, suspicious for single bone met.  In 07/2022, abiraterone/prednisone started.  In 08/2022, RT to prostate/SV/nodes and pubic ramus bone met site, completed in 12/2022.     Prostate cancer (CMS-HCC)   06/17/2022 Initial Diagnosis    Prostate cancer (CMS-HCC)     06/17/2022 -  Cancer Staged    Staging form: Prostate, AJCC 8th Edition  - Clinical: Stage IIIC (cT4, cN0, cM0, PSA: 18, Grade Group: 5) - Signed by Young E Whang, MD on 06/19/2022       07/07/2022 - 07/07/2022 Endocrine/Hormone Therapy    OP DEGARELIX  Plan Provider: Young E Whang, MD     08/11/2022 Endocrine/Hormone Therapy    OP PROSTATE LEUPROLIDE (ELIGARD) 45 MG EVERY 6 MONTHS  Plan Provider: Young E Whang, MD     10 /10/2022 -  Radiation    Radiation Therapy Treatment Details (Noted on 09/10/2022)  Site: Prostate  Technique: IMRT  Goal: No goal specified  Planned Treatment Start Date: No planned start date specified       REVIEW OF SYSTEMS:  A comprehensive review of 10 systems was negative except for pertinent positives noted in HPI.    PHYSICAL EXAM:   GEN: NAD  CV: no evidence of cyanosis  PULM: CTAB, No increased work of breathing  ABD: soft, ND  MSK: mild sarcopenia  EXT: No LE edema  NEURO: AO x 3, No gross focal deficits,  no myoclonus  PSYCH: mood and affect appropriate, no agitation            I personally spent 45 minutes face-to-face and non-face-to-face in the care of this patient, which includes all pre, intra, and post visit time on the date of service.  All documented time was specific to the E/M visit and does not include any procedures that may have been performed.        Redgie Grayer, MD, MEd  [facility] Outpatient Oncology Palliative Care

## 2023-05-19 NOTE — Unmapped (Signed)
Addended by: Rico Ala on: 05/18/2023 05:19 PM     Modules accepted: Orders

## 2023-06-11 NOTE — Unmapped (Unsigned)
[facility] Clinical Assessment & Refill Coordination Note    Patrick Montgomery, DOB: 06/16/1961  Phone: [phone] (home)     All above HIPAA information was verified with {Blank:19197::patient.,patient's caregiver, ***.,patient's family member, ***.}     Was a Nurse, learning disability used for this call? {Blank single:19197::Yes, ***. Patient language is appropriate in WAM,No}    Specialty Medication(s):   {specpharm:59087}     Current Outpatient Medications   Medication Sig Dispense Refill    abiraterone (ZYTIGA) 250 mg tablet Take 4 tablets (1,000 mg total) by mouth daily. 120 tablet 11    acetaminophen (TYLENOL EXTRA STRENGTH) 500 MG tablet Take 2 tablets (1,000 mg total) by mouth two (2) times a day as needed for pain. 100 tablet 2    amlodipine (NORVASC) 5 MG tablet Take 1 tablet (5 mg total) by mouth daily. 90 tablet 3    aspirin (ECOTRIN) 81 MG tablet Take 1 tablet (81 mg total) by mouth daily. 90 tablet 3    atorvastatin (LIPITOR) 10 MG tablet Take 1 tablet (10 mg total) by mouth daily. 90 tablet 0    cyclobenzaprine (FLEXERIL) 10 MG tablet Take 1 tablet (10 mg total) by mouth nightly as needed for muscle spasms. 30 tablet 3    gabapentin (NEURONTIN) 300 MG capsule Take 1 capsule (300 mg total) by mouth nightly.      lidocaine (XYLOCAINE) 5 % ointment Apply to affected area daily 37 g 0    naloxone (NARCAN) 4 mg nasal spray One spray in either nostril once for known/suspected opioid overdose. May repeat every 2-3 minutes in alternating nostril til EMS arrives 2 each 3    oxybutynin (DITROPAN) 5 MG tablet Take 1 tablet (5 mg total) by mouth Three (3) times a day. 90 tablet 11    oxyCODONE (ROXICODONE) 5 MG immediate release tablet Take 1-2 tablets (5-10 mg total) by mouth every four (4) hours as needed for pain. No more than 7 tablets per day. 196 tablet 0    polyethylene glycol (MIRALAX) 17 gram packet Take 17 g by mouth two (2) times a day. 100 packet 2    potassium chloride (KLOR-CON) 10 MEQ CR tablet Take 1 tablet (10 mEq total) by mouth daily. 30 tablet 3    predniSONE (DELTASONE) 5 MG tablet Take 1 tablet (5 mg total) by mouth daily. 14 tablet 0    predniSONE (DELTASONE) 5 MG tablet Take 1 tablet (5 mg total) by mouth daily. 30 tablet 11    SENNA 8.6 mg tablet Take 2 tablets by mouth nightly. 180 tablet 3    tamsulosin (FLOMAX) 0.4 mg capsule Take 1 capsule (0.4 mg total) by mouth daily. 30 capsule 11     No current facility-administered medications for this visit.        Changes to medications: {Blank:19197::Mayco reports starting the following medications: ***,Jake Shark Reports stopping the following medications: ***,Jamias reports no changes at this time.}    No Known Allergies    Changes to allergies: {Blank:19197::Yes: ***,No}    SPECIALTY MEDICATION ADHERENCE     *** *** {Blank:19197::mg,mg/ml,***}: *** days of medicine on hand   *** *** {Blank:19197::mg,mg/ml,***}: *** days of medicine on hand   *** *** {Blank:19197::mg,mg/ml,***}: *** days of medicine on hand   *** *** {Blank:19197::mg,mg/ml,***}: *** days of medicine on hand   *** *** {Blank:19197::mg,mg/ml,***}: *** days of medicine on hand          Specialty medication(s) dose(s) confirmed: {Blank:19197::Regimen is correct and unchanged.,Patient  reports changes to the regimen as follows: ***}     Are there any concerns with adherence? {Blank:19197::Yes: ***,No}    Adherence counseling provided? {Blank:19197::Yes: ***,Not needed}    CLINICAL MANAGEMENT AND INTERVENTION      Clinical Benefit Assessment:    Do you feel the medicine is effective or helping your condition? {Blank:19197::Yes,No,Patient declined to answer}    Clinical Benefit counseling provided? {Blank:19197::Not needed,Reasonable expectations discussed: ***,Labs from *** show evidence of clinical benefit,Progress note from *** shows evidence of clinical benefit,consulted provider regarding clinical benefit concerns,***}    Adverse Effects Assessment:    Are you experiencing any side effects? {Blank:19197::Yes, patient reports experiencing ***. Side effect counseling provided: ***,No}    Are you experiencing difficulty administering your medicine? {Blank:19197::Yes, patient reports ***. Medication administration counseling provided: ***,No}    Quality of Life Assessment:    Quality of Life    Rheumatology  Oncology  Dermatology  Cystic Fibrosis          {DiseaseSpecificQOL:73897}    Have you discussed this with your provider? {Blank:19197::Not needed,Yes,No - pharmacist will consult provider}    Acute Infection Status:    Acute infections noted within Epic:  No active infections  Patient reported infection: {Blank single:19197::None,***- patient reported to provider,***- pharmacy reported to provider}    Therapy Appropriateness:    Is therapy appropriate and patient progressing towards therapeutic goals? {Blank:19197::Yes, therapy is appropriate and should be continued,Pharmacist will consult provider}    DISEASE/MEDICATION-SPECIFIC INFORMATION      {clinicspecificinstructions:59274}    {DISEASESTATESPECIFICASSESSMENT:98894}    PATIENT SPECIFIC NEEDS     Does the patient have any physical, cognitive, or cultural barriers? {Blank single:19197::No,Yes - ***}    Is the patient high risk? {sschighriskpts:78327}    Did the patient require a clinical intervention? {Blank single:19197::No,Yes (If yes, document using the sscrphintervention smartphrase)}    Does the patient require physician intervention or other additional services (i.e., nutrition, smoking cessation, social work)? {Blank single:19197::No,Yes, ***}    SOCIAL DETERMINANTS OF HEALTH     At the [facility], we have learned that life circumstances - like trouble affording food, housing, utilities, or transportation can affect the health of many of our patients.   That is why we wanted to ask: are you currently experiencing any life circumstances that are negatively impacting your health and/or quality of life? {YES/NO/PATIENTDECLINED:93004}    Social Determinants of Health     Financial Resource Strain: Low Risk  (05/06/2022)    Overall Financial Resource Strain (CARDIA)     Difficulty of Paying Living Expenses: Not hard at all   Internet Connectivity: No Internet connectivity concern identified (05/06/2022)    Internet Connectivity     Do you have access to internet services: Yes     How do you connect to the internet: Personal Device at home     Is your internet connection strong enough for you to watch video on your device without major problems?: Yes     Do you have enough data to get through the month?: Yes     Does at least one of the devices have a camera that you can use for video chat?: Yes   Food Insecurity: No Food Insecurity (05/06/2022)    Hunger Vital Sign     Worried About Running Out of Food in the Last Year: Never true     Ran Out of Food in the Last Year: Never true   Tobacco Use: High Risk (05/18/2023)    Patient  History     Smoking Tobacco Use: Every Day     Smokeless Tobacco Use: Never     Passive Exposure: Not on file   Housing/Utilities: Low Risk  (05/06/2022)    Housing/Utilities     Within the past 12 months, have you ever stayed: outside, in a car, in a tent, in an overnight shelter, or temporarily in someone else's home (i.e. couch-surfing)?: No     Are you worried about losing your housing?: No     Within the past 12 months, have you been unable to get utilities (heat, electricity) when it was really needed?: No   Alcohol Use: Not on file   Transportation Needs: No Transportation Needs (05/06/2022)    PRAPARE - Transportation     Lack of Transportation (Medical): No     Lack of Transportation (Non-Medical): No   Substance Use: Not on file   Health Literacy: Not on file   Physical Activity: Not on file   Interpersonal Safety: Unknown (06/11/2023)    Interpersonal Safety     Unsafe Where You Currently Live: Not on file     Physically Hurt by Anyone: Not on file     Abused by Anyone: Not on file   Stress: Not on file   Intimate Partner Violence: Not At Risk (05/06/2022)    Humiliation, Afraid, Rape, and Kick questionnaire     Fear of Current or Ex-Partner: No     Emotionally Abused: No     Physically Abused: No     Sexually Abused: No   Depression: Not at risk (06/22/2022)    PHQ-2     PHQ-2 Score: 0   Social Connections: Not on file       Would you be willing to receive help with any of the needs that you have identified today? {Yes/No/Not applicable:93005}       SHIPPING     Specialty Medication(s) to be Shipped:   {specpharm:59087}    Other medication(s) to be shipped: {Blank:19197::***,No additional medications requested for fill at this time}     Changes to insurance: {Blank:19197::Yes: ***,No}    Delivery Scheduled: {Blank:19197::Yes, Expected medication delivery date: ***.,Yes, Expected medication delivery date: ***.  However, Rx request for refills was sent to the provider as there are none remaining.,Patient declined refill at this time due to ***.,No, cannot schedule delivery at this time as there are outstanding items that need addressed.  This note has been handed off to the provider for follow up.,Due to patient insurance changes, unable to fill at [facility], please route Rx to *** specialty pharmacy}     Medication will be delivered via {Blank:19197::UPS,Next Day Courier,Same Day Courier,Clinic Courier - *** clinic,***} to the confirmed {Blank:19197::prescription,temporary} address in [facility].    The patient will receive a drug information handout for each medication shipped and additional FDA Medication Guides as required.  Verified that patient has previously received a Conservation officer, historic buildings and a Surveyor, mining.    The patient or caregiver noted above participated in the development of this care plan and knows that they can request review of or adjustments to the care plan at any time.      All of the patient's questions and concerns have been addressed.    Racheal Patches   [facility] Specialty Pharmacist

## 2023-06-16 DIAGNOSIS — C61 Malignant neoplasm of prostate: Principal | ICD-10-CM

## 2023-06-16 MED ORDER — OXYCODONE 5 MG TABLET
ORAL_TABLET | ORAL | 0 refills | 17 days | Status: CP | PRN
Start: 2023-06-16 — End: ?

## 2023-06-17 MED FILL — AMLODIPINE 5 MG TABLET: ORAL | 90 days supply | Qty: 90 | Fill #2

## 2023-06-17 MED FILL — PREDNISONE 5 MG TABLET: ORAL | 30 days supply | Qty: 30 | Fill #4

## 2023-07-01 NOTE — Unmapped (Signed)
OUTPATIENT ONCOLOGY PALLIATIVE CARE CONSULT NOTE    Patrick Montgomery is a 62 y.o. male who is seen in consultation at the request of Patrick Montgomery* for evaluation of Symptoms     Principal Diagnosis: Patrick Montgomery is a 62 y.o. male with prostate cancer, diagnosed in June 2023 now on ADT with Eliguard.  Disease sites include prostate, ?solitary bone metastasis.     Assessment/Plan:     #Cancer-Related Pain: Persistent, not improved, but controlled by current regimen. Severe pain in the right perineal area.  Unclear if cancer related although this does seem to correlate with CT finding of increased conspicuity of right inferior pubic ramus mixed lytic sclerotic lesion.  Unclear if pain is from this is disease versus radiation changes.  Now describing more of a neuropathic burning sensation with radiation down leg.   - s/p xtampza as patient felt unclear efficacy and then self-discontinued when he ran out  - Refill oxycodone to 5-10 mg q4 hrs PRN --> no more than 7 tablets per day  - Increase gabapentin 300mg  to TID  - Referral to Anesthesia pain for eval for possible intervention vs additional imaging  - Cont tylenol 1000 mg TID PRN  - Refill flexeril 10mg  nightly PRN  - Cont Lidocaine ointment to tender area of skin  - Future considerations: Pelvic PT vs Consider reimaging of pelvis/perineum if pain worsens or persist    Prior Medications:   - MS contin - didn't find effective.  Oxycodone 5 mg effective.  - Naproxen BID - mild benefit  - Tylenol - mild to no benefit    #Cancer-Related Fatigue: Stable. Still able to be active. Gets out of house daily.   - Encouraged light exercise as able    #Early Satiety: Stable, still with early satiety, but weight stable  - If worsens, patinet open to nutrition referral in future  - CTM, possible marinol trial in future if appetite becomes issue    #Constipation: Slightly worse - having to strain more, but is having regular BMs.  - cont senna to 2 tablet nightly  - Continue MiraLAX 1-2 times daily - has not been taking so he will resume today   - Low threshold to escalate regimen vs. Start naloxegol    #Skin Lesions: Hx of skin cancer. Previously seen by [facility] derm.   - Re-refer to [facility] derm.     #Mood/Coping: Stable.  Good support from his wife, mother and children. Stays positive by nature. Says he would be perfect if not for lingering pain.     #Goals of Care/Prognostic Awareness: Previously provided patient with prepare for your care paperwork and introduced the concepts of CODE STATUS and the purpose of advance directives.  Patient appreciated having something to take at home and complete with his partner. Hasn't returned this to clinic yet.   - Remains on board with current plan of care.     From prior convos:  - Knows he has prostate cancer and is followed by Patrick Montgomery.  Knows that recent PSA was good.  Understands that he is taking medicine through 2026 for this.  Unsure if his disease is curable or not and what the overall trajectory of his prognosis is, but would like to know.  Is a type of person who would want to know details about his disease and prognosis going forward.  - Patient shares his understanding that he has metastatic prostate cancer. He does not recall much about what to expect from this  cancer, including about prognosis. He knows that there are several treatment options, but is not sure whether it is curable or not.   - We asked whether he is the type of person who wants to know details about prognosis and he reports that he would like to know. He appreciates honest and direct communication. We said that we would discuss with oncology so that we can provide accurate communication.   - Finally, we asked who his preferred HCDM is and he says that it is his domestic partner, Patrick Montgomery. We discussed that she would not be default HCPOA by state law and recommended completing formal HCPOA paperwork to allow her to be his surrogate if needed in the future.     #Advance Care Planning:  HCDM:   HCDM (patient stated preference): Patrick Montgomery - Domestic Partner [phone]  Natural surrogate decision maker: majority of adult children and living parents  Advance Directive: none on file - wants to do HCPOA      #Controlled substances risk management.   - Patient has a signed pain medication agreement with Outpatient Palliative Care, completed on 01/28/23, as per standard of care.   - NCCSRS database was reviewed today and it was appropriate.   - Urine drug screen was performed at this visit. Findings: periodic screening has been appropriate.   - Patient has received information about safe storage and administration of medications.   - Patient has received a prescription for narcan and patient is not applicable.     F/u: 4-6 weeks    ----------------------------------------    Referring Provider: Ortencia Montgomery  Oncology Team: Patrick Montgomery  PCP: Patrick Lever, FNP    Interval Hx, 07/02/23:    No major events since our last visit. Hasn't noticed much change off the abiterone.    Unfortunately, he reports pain persists. Remains unchanged. Not worse, but not better. Hurts more when laying down or sitting donw. Has associated radiation down R leg and sometimes up towards hip/lower back.    Still having constipation with some straining. NOt using miralax, but is using senna. No n/v, or diarrhea. No dyspnea.     Appetite is down from baseline. Has early satiety. Weigth is stable.     Mood is good. Had a nice trip to the beach for July 4.    On ROS, notes new skin lesions.     HPI: Patrick Montgomery is a 62 year old male with history of prostate cancer establishing with palliative care today primarily for pain control.    In terms of pain, patient locates the pain just to the right of his rectum, sometimes with radiation down his leg and up his back. He describes the pain as a sharp and excruciating pain. He notes that it is constant. It is worse after moving around all day and worse when sitting down.  It feels better if he massages the area.  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    Palliative Performance Scale: 60% - Ambulation: Reduced / unable to do hobby or some housework, significant disease / Self-Care:Occasional assist as necessary / Intake:Normal or reduced / Level of Conscious: Full or confusion    Social History:   Lives in [place], [place] with his domestic partner, Belenda Cruise (not officially married). They have an 18yo daughter. Also has a 26yo son who runs a comic book store. Mother is living  and also a source of support.    Watches church every Sunday - [employer]. Also belongs to a [employer] church.     Occupation: Retired house painter  Hobbies: Likes to race go-karts and cars. Likes to watch his nephew do this as well.     Objective     Hematology/Oncology History Overview Note   In 03/2022, seen in ER for gross hematuria, worsening LUTS. CT bladder showing bladder mass.  In 04/2022, PSA 18. Had cysto, prostate biopsy, TURP. Prostate biopsy showed Gleason 4+5=9 (GG 5), 10/12 cores positive for cancer. TURP specimen showed mixed acinar/ductal features, cribriborm morphology.  In 05/2022, CT and bone scan showed no pelvic or distant mets  In 06/2022, ADT started with Degarelix, continued with Eligard.  In 06/2022, PSMA PET showed avidity in R inferior pubic ramus, suspicious for single bone met.  In 07/2022, abiraterone/prednisone started.  In 08/2022, RT to prostate/SV/nodes and pubic ramus bone met site, completed in 12/2022.  In 05/2023, abiraterone held due to hypertension and hypokalemia.     Prostate cancer (CMS-HCC)   06/17/2022 Initial Diagnosis    Prostate cancer (CMS-HCC)     06/17/2022 -  Cancer Staged    Staging form: Prostate, AJCC 8th Edition  - Clinical: Stage IIIC (cT4, cN0, cM0, PSA: 18, Grade Group: 5) - Signed by Young E Whang, MD on 06/19/2022       07/07/2022 - 07/07/2022 Endocrine/Hormone Therapy    OP DEGARELIX  Plan Provider: Young E Whang, MD     08/11/2022 Endocrine/Hormone Therapy    OP PROSTATE LEUPROLIDE (ELIGARD) 45 MG EVERY 6 MONTHS  Plan Provider: Young E Whang, MD     10 /10/2022 -  Radiation    Radiation Therapy Treatment Details (Noted on 09/10/2022)  Site: Prostate  Technique: IMRT  Goal: No goal specified  Planned Treatment Start Date: No planned start date specified       REVIEW OF SYSTEMS:  A comprehensive review of 10 systems was negative except for pertinent positives noted in HPI.    PHYSICAL EXAM:   GEN: NAD  CV: no evidence of cyanosis  PULM: No increased work of breathing  ABD: soft, ND  MSK: mild sarcopenia  EXT: No LE edema  NEURO: AO x 3, No gross focal deficits,  no myoclonus  PSYCH: mood and affect appropriate, no agitation            I personally spent 42 minutes face-to-face and non-face-to-face in the care of this patient, which includes all pre, intra, and post visit time on the date of service.  All documented time was specific to the E/M visit and does not include any procedures that may have been performed.        Redgie Grayer, MD, MEd  [facility] Outpatient Oncology Palliative Care

## 2023-07-02 ENCOUNTER — Ambulatory Visit
Admit: 2023-07-02 | Discharge: 2023-07-03 | Payer: PRIVATE HEALTH INSURANCE | Attending: Student in an Organized Health Care Education/Training Program | Primary: Student in an Organized Health Care Education/Training Program

## 2023-07-02 DIAGNOSIS — L989 Disorder of the skin and subcutaneous tissue, unspecified: Principal | ICD-10-CM

## 2023-07-02 DIAGNOSIS — G893 Neoplasm related pain (acute) (chronic): Principal | ICD-10-CM

## 2023-07-02 DIAGNOSIS — R399 Unspecified symptoms and signs involving the genitourinary system: Principal | ICD-10-CM

## 2023-07-02 DIAGNOSIS — C61 Malignant neoplasm of prostate: Principal | ICD-10-CM

## 2023-07-02 MED ORDER — CYCLOBENZAPRINE 10 MG TABLET
ORAL_TABLET | Freq: Every evening | ORAL | 3 refills | 30 days | Status: CP | PRN
Start: 2023-07-02 — End: ?

## 2023-07-02 MED ORDER — GABAPENTIN 300 MG CAPSULE
ORAL_CAPSULE | Freq: Three times a day (TID) | ORAL | 3 refills | 30 days | Status: CP
Start: 2023-07-02 — End: 2024-07-01

## 2023-07-03 MED ORDER — PREDNISONE 5 MG TABLET
ORAL_TABLET | Freq: Every day | ORAL | 0 refills | 0 days
Start: 2023-07-03 — End: ?

## 2023-07-06 MED ORDER — PREDNISONE 5 MG TABLET
ORAL_TABLET | Freq: Every day | ORAL | 11 refills | 30 days | Status: CP
Start: 2023-07-06 — End: ?

## 2023-07-14 MED ORDER — OXYCODONE 5 MG TABLET
ORAL_TABLET | ORAL | 0 refills | 17 days | Status: CP | PRN
Start: 2023-07-14 — End: ?

## 2023-07-29 NOTE — Unmapped (Signed)
Patrick Montgomery has been contacted in regards to their refill of abiraterone. At this time, they have declined refill due to medication being on hold. Refill assessment call date has been updated per the patient's request.    Last dispensed on 05/12/23 x 30 day supply.    Horace Porteous, PharmD  [facility]

## 2023-08-03 ENCOUNTER — Ambulatory Visit
Admit: 2023-08-03 | Discharge: 2023-08-04 | Payer: PRIVATE HEALTH INSURANCE | Attending: Student in an Organized Health Care Education/Training Program | Primary: Student in an Organized Health Care Education/Training Program

## 2023-08-03 ENCOUNTER — Ambulatory Visit: Admit: 2023-08-03 | Payer: PRIVATE HEALTH INSURANCE | Attending: Radiation Oncology | Primary: Radiation Oncology

## 2023-08-03 DIAGNOSIS — E876 Hypokalemia: Principal | ICD-10-CM

## 2023-08-03 DIAGNOSIS — C61 Malignant neoplasm of prostate: Principal | ICD-10-CM

## 2023-08-03 DIAGNOSIS — K5903 Drug induced constipation: Principal | ICD-10-CM

## 2023-08-03 DIAGNOSIS — G893 Neoplasm related pain (acute) (chronic): Principal | ICD-10-CM

## 2023-08-03 DIAGNOSIS — R630 Anorexia: Principal | ICD-10-CM

## 2023-08-03 MED ORDER — POTASSIUM CHLORIDE ER 10 MEQ TABLET,EXTENDED RELEASE
ORAL_TABLET | Freq: Every day | ORAL | 3 refills | 30 days | Status: CP
Start: 2023-08-03 — End: ?

## 2023-08-03 MED ORDER — GABAPENTIN 300 MG CAPSULE
ORAL_CAPSULE | Freq: Three times a day (TID) | ORAL | 3 refills | 30 days | Status: CP
Start: 2023-08-03 — End: 2024-08-02

## 2023-08-03 NOTE — Unmapped (Signed)
OUTPATIENT ONCOLOGY PALLIATIVE CARE CONSULT NOTE    Patrick Montgomery is a 62 y.o. male who is seen in consultation at the request of Patrick Montgomery* for evaluation of Symptoms     Principal Diagnosis: Patrick Montgomery is a 62 y.o. male with prostate cancer, diagnosed in June 2023 now on ADT with Eliguard.  Disease sites include prostate, ?solitary bone metastasis.     Assessment/Plan:     #Cancer-Related Pain: Persistent, mostly controlled by current regimen. Moderate to severe pain in the right perineal area. Unclear if cancer related although this does seem to correlate with CT finding of increased conspicuity of right inferior pubic ramus mixed lytic sclerotic lesion.  Unclear if pain is from this is disease versus radiation changes.  Now describing more of a neuropathic burning sensation with radiation down leg.   - s/p xtampza as patient felt unclear efficacy and then self-discontinued when he ran out  - Refill oxycodone to 5-10 mg q4 hrs PRN --> no more than 7 tablets per day  - Increase gabapentin 600mg  to TID  - Referral to Anesthesia pain for eval for possible intervention vs additional imaging - to be seen 9/25  - Cont tylenol 1000 mg TID PRN  - Refill flexeril 10mg  nightly PRN  - Cont Lidocaine ointment to tender area of skin  - Future considerations: Pelvic PT vs Consider reimaging of pelvis/perineum if pain worsens or persist    Prior Medications:   - MS contin - didn't find effective.  Oxycodone 5 mg effective.  - Naproxen BID - mild benefit  - Tylenol - mild to no benefit    #Cancer-Related Fatigue: Stable. Still able to be active. Gets out of house daily.   - Encouraged light exercise as able    #Early Satiety: Improved and stable. Now gaining weight.   - If worsens, patinet open to nutrition referral in future  - CTM, possible marinol trial in future if appetite becomes issue    #Constipation: Improved with bowel reimgen.   - cont senna to 2 tablet nightly  - Continue MiraLAX 1-2 times daily   - Low threshold to escalate regimen vs. Start naloxegol    #Skin Lesions: Hx of skin cancer. Previously seen by [facility] derm.   - Re-refer to [facility] derm - appt is pending    #Mood/Coping: Stable.  Good support from his wife, mother and children. Stays positive by nature. Says he would be perfect if not for lingering pain.     #Goals of Care/Prognostic Awareness: Previously provided patient with prepare for your care paperwork and introduced the concepts of CODE STATUS and the purpose of advance directives.  Patient appreciated having something to take at home and complete with his partner. Hasn't returned this to clinic yet.   - Remains on board with current plan of care.     From prior convos:  - Knows he has prostate cancer and is followed by Patrick Montgomery.  Knows that recent PSA was good.  Understands that he is taking medicine through 2026 for this.  Unsure if his disease is curable or not and what the overall trajectory of his prognosis is, but would like to know.  Is a type of person who would want to know details about his disease and prognosis going forward.  - Patient shares his understanding that he has metastatic prostate cancer. He does not recall much about what to expect from this cancer, including about prognosis. He knows that there are several treatment  options, but is not sure whether it is curable or not.   - We asked whether he is the type of person who wants to know details about prognosis and he reports that he would like to know. He appreciates honest and direct communication. We said that we would discuss with oncology so that we can provide accurate communication.   - Finally, we asked who his preferred HCDM is and he says that it is his domestic partner, Patrick Montgomery. We discussed that she would not be default HCPOA by state law and recommended completing formal HCPOA paperwork to allow her to be his surrogate if needed in the future.     #Advance Care Planning:  HCDM:   HCDM (patient stated preference): Patrick Montgomery - Domestic Partner [phone]  Natural surrogate decision maker: majority of adult children and living parents  Advance Directive: none on file - wants to do HCPOA      #Controlled substances risk management.   - Patient has a signed pain medication agreement with Outpatient Palliative Care, completed on 01/28/23, as per standard of care.   - NCCSRS database was reviewed today and it was appropriate.   - Urine drug screen was performed at this visit. Findings: periodic screening has been appropriate.   - Patient has received information about safe storage and administration of medications.   - Patient has received a prescription for narcan and patient is not applicable.     F/u: 4-6 weeks    ----------------------------------------    Referring Provider: Ortencia Montgomery  Oncology Team: Patrick Montgomery  PCP: Patrick Lever, FNP    Interval Hx, 08/03/23:    Patient reports that things are relatively unchanged. He continues to have pain in the same location. If anything, describes it as even more radiating. Gabapentin may help, but he isn't sure. Oxycodone at current levels does help.     He remains active and able to do most, if not all, activity aside from full-time work.     Notes ipmrovement in appetite and some weight gain. Has occasional constipation.     No n/v, or diarrhea. No dyspnea. Mood is good overall. Has upcoming fishing trip.     HPI: Patrick Montgomery is a 62 year old male with history of prostate cancer establishing with palliative care today primarily for pain control.    In terms of pain, patient locates the pain just to the right of his rectum, sometimes with radiation down his leg and up his back. He describes the pain as a sharp and excruciating pain. He notes that it is constant. It is worse after moving around all day and worse when sitting down.  It feels better if he massages the area.  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    Palliative Performance Scale: 60% - Ambulation: Reduced / unable to do hobby or some housework, significant disease / Self-Care:Occasional assist as necessary / Intake:Normal or reduced / Level of Conscious: Full or confusion    Social History:   Lives in [place], [place] with his domestic partner, Belenda Cruise (not officially married). They have an 18yo daughter. Also has a 26yo son who runs a comic book store. Mother is living and also a source of support.    Watches church every Sunday - [employer]. Also belongs to a [employer].     Occupation: Retired Surveyor, minerals  Hobbies: Wellsite geologist  to race go-karts and cars. Likes to watch his nephew do this as well.     Objective     Hematology/Oncology History Overview Note   In 03/2022, seen in ER for gross hematuria, worsening LUTS. CT bladder showing bladder mass.  In 04/2022, PSA 18. Had cysto, prostate biopsy, TURP. Prostate biopsy showed Gleason 4+5=9 (GG 5), 10/12 cores positive for cancer. TURP specimen showed mixed acinar/ductal features, cribriborm morphology.  In 05/2022, CT and bone scan showed no pelvic or distant mets  In 06/2022, ADT started with Degarelix, continued with Eligard.  In 06/2022, PSMA PET showed avidity in R inferior pubic ramus, suspicious for single bone met.  In 07/2022, abiraterone/prednisone started.  In 08/2022, RT to prostate/SV/nodes and pubic ramus bone met site, completed in 12/2022.  In 05/2023, abiraterone held due to hypertension and hypokalemia.     Prostate cancer (CMS-HCC)   06/17/2022 Initial Diagnosis    Prostate cancer (CMS-HCC)     06/17/2022 -  Cancer Staged    Staging form: Prostate, AJCC 8th Edition  - Clinical: Stage IIIC (cT4, cN0, cM0, PSA: 18, Grade Group: 5) - Signed by Maurie Boettcher, MD on 06/19/2022       07/07/2022 - 07/07/2022 Endocrine/Hormone Therapy    OP DEGARELIX  Plan Provider: Maurie Boettcher, MD     08/11/2022 Endocrine/Hormone Therapy    OP PROSTATE LEUPROLIDE (ELIGARD) 45 MG EVERY 6 MONTHS  Plan Provider: Maurie Boettcher, MD     09/10/2022 -  Radiation    Radiation Therapy Treatment Details (Noted on 09/10/2022)  Site: Prostate  Technique: IMRT  Goal: No goal specified  Planned Treatment Start Date: No planned start date specified       REVIEW OF SYSTEMS:  A comprehensive review of 10 systems was negative except for pertinent positives noted in HPI.    PHYSICAL EXAM:   GEN: NAD  CV: no evidence of cyanosis  PULM: No increased work of breathing  ABD: soft, ND  MSK: mild sarcopenia  EXT: No LE edema  NEURO: AO x 3, No gross focal deficits,  no myoclonus  PSYCH: mood and affect appropriate, no agitation            I personally spent 40 minutes face-to-face and non-face-to-face in the care of this patient, which includes all pre, intra, and post visit time on the date of service.  All documented time was specific to the E/M visit and does not include any procedures that may have been performed.        Redgie Grayer, MD, MEd  [facility] Outpatient Oncology Palliative Care

## 2023-08-03 NOTE — Unmapped (Signed)
No diarrhea/loose watery stool. Fairly frequent bowel urgency and pain/tenderness. No bleeding. Fairly frequent abdominal cramping and passing mucus. Fairly frequent urgency to have a BM but unable to.    Urinary flow is fairly easy. Urinates x2-3 nightly and x5-8 daily. No pain and burning.  Fairly frequent urgency. Occasional leakage. Pt not taking Flomax at this time.     No nausea.

## 2023-08-03 NOTE — Unmapped (Signed)
RADIATION ONCOLOGY FOLLOW-UP VISIT NOTE     Encounter Date: 08/03/2023  Patient Name: Patrick Montgomery  Medical Record Number: [id number]    DIAGNOSIS:  62yo with high risk prostate cancer (PSA 18, Gleason 4+5 in 10/12 cores) with extension into the bladder neck and a solitary PSMA PET-avid lesion in the R pubic ramus concerning for oligometastatic disease.  He was treated with definitive RT (70Gy/80fx) finished 10/2022.    DURATION SINCE COMPLETION OF RADIOTHERAPY:  9 months (11/10/2022)    ASSESSMENT:  Disease Status: PSA <0.04 on ADT, no evidence of dsiease    RECOMMENDATIONS:  FOLLOW-UP:  Med onc following q3 months and also following with palliative care team regularly.  Discussed prn follow-up with rad onc.  GU:  LUTS significantly improved.  Had cysto recently showing no concerning findings  GI:  Improved from prior- still having issues balancing constipation (while on narcotic pain meds) with bowel regimen.  Reviewed bowel regimen again  Pain:  This has been an ongoing issue predating radiation treatments.  I suspect that his pain may be related to his underlying met to the R pubic ramus or potentially an underlying fracture.  Unfortunately the met was treated with his recent RT treatment and pain did not improve.  There's also a neuropathic component to the pain as it is radiating down his leg. He is now following with palliative care team and continues on oxycodone, naproxen.  Appointment with the anesthesia pain team is pending.  ADT: Plans for 3 years of ADT/abi with med onc    INTERVAL HISTORY:      Overall doing well today without any major changes from our last visit.  His biggest issue remains the pelvic pain that is very similar to 6 months ago in intensity, but is now starting to radiate down his leg.  He continues to take oxycodone for pain control which makes it manageable.  He is awaiting an appointment with the anesthesia pain team in 3 weeks for further management options.  Urination is doing well overall.  He has nocturia 2-3, some urgency and rare urge incontinence.  Good flow, no dysuria.  Has had a few kidney stones associated with bleeding but otherwise no hematuria.  Bowels are OK- tends to be on the loose side but can occasionally have constipation.  Does not take a regular bowel regimen as this causes more issues with diarrhea.    REVIEW OF SYSTEMS:  A comprehensive review of 10 systems was negative except for pertinent positives noted in HPI.    PAST MEDICAL HISTORY/FAMILY HISTORY/SOCIAL HISTORY:  Reviewed in EPIC    ALLERGIES/MEDICATIONS:  Reviewed in EPIC    PHYSICAL EXAM:  Vital Signs for this encounter:   Wt 63.8 kg (140 lb 9.6 oz)  - BMI 22.69 kg/m??   Karnofsky/Lansky Performance Status: 80, Normal activity with effort; some signs or symptoms of disease (ECOG equivalent 1)  General:   No acute distress, alert and oriented X 4   Psychiatric:  Normal mood and affect.  Converses clearly and emotionally appropriate.   HEENT:  EOMI, MMM  Cardio: RRR, no m/r/g  Respiratory: LCTAB, no w/r/r  Abdomen: Soft, non-tender, non-distended. Normal active bowel sounds  Neuro: CN III-XII intact grossly, AAOx3  Extremities:  No clubbing cyanosis, or edema    RADIOLOGY:   CT A/P 12/15/2022 personally reviewed  --Mild rectosigmoid wall thickening without surrounding inflammatory changes, favored secondary to radiation changes, with additional imaging findings suggestive of slow bowel transit.      --  Increased conspicuity of right inferior pubic ramus mixed lytic sclerotic lesion which may reflect posttreatment changes versus worsening site of disease. No new osseous metastases.         Labs:    No results found for: WBC, HGB, HCT, PLT, LDH, CREATININE, AST, ALT, MG      Rayetta Humphrey, MD  Assistant Professor  [facility] Dept of Radiation Oncology  08/03/2023

## 2023-08-09 MED ORDER — OXYCODONE 5 MG TABLET
ORAL_TABLET | ORAL | 0 refills | 17 days | Status: CP | PRN
Start: 2023-08-09 — End: ?

## 2023-08-11 ENCOUNTER — Ambulatory Visit: Admit: 2023-08-11 | Discharge: 2023-08-12 | Payer: PRIVATE HEALTH INSURANCE

## 2023-08-11 DIAGNOSIS — L814 Other melanin hyperpigmentation: Principal | ICD-10-CM

## 2023-08-11 DIAGNOSIS — L570 Actinic keratosis: Principal | ICD-10-CM

## 2023-08-11 DIAGNOSIS — L989 Disorder of the skin and subcutaneous tissue, unspecified: Principal | ICD-10-CM

## 2023-08-11 DIAGNOSIS — L821 Other seborrheic keratosis: Principal | ICD-10-CM

## 2023-08-11 DIAGNOSIS — D1801 Hemangioma of skin and subcutaneous tissue: Principal | ICD-10-CM

## 2023-08-11 DIAGNOSIS — L578 Other skin changes due to chronic exposure to nonionizing radiation: Principal | ICD-10-CM

## 2023-08-11 DIAGNOSIS — D229 Melanocytic nevi, unspecified: Principal | ICD-10-CM

## 2023-08-11 NOTE — Unmapped (Signed)
Dermatology Note     Assessment and Plan:      Neoplasm(ia) of unspecified etiology:  - To help confirm diagnosis, biopsy/biopsies obtained today.   Biopsy (Shave) Procedure Note:   After R/B/A discussed (including scarring, pigment alteration, recurrence, or persistence of the lesion) and consent was obtained, the area was marked and photographed. Time Out verification of patient, procedure, and site was performed. When applicable, safety precautions based on patient's medical history or medication use and review of relevant images and results was also performed as part of the Time Out. Site was then prepped with alcohol, and anesthetized with lidocaine 2% with epinephrine. Biopsy(ies) performed using a shave technique. Hemostasis was achieved with pressure, aluminum chloride, Monsel's, and/or electrocautery. Area was dressed with petrolatum and bandage. Wound care instructions were provided. We will contact the patient with results when available. Patient agrees to be notified of results by MyChart - even if needs further management.   A) Location: left ear, DDx: BCC vs SCC vs AK vs KA vs SK  B) Location: left forearm, DDx: BCC vs SCC vs AK vs KA vs SK  C) Location: right forearm superior, DDx: BCC vs SCC vs AK vs KA vs SK vs prurigo nodule  D) Location: right forearm inferior , DDx: BCC vs SCC vs AK vs KA vs SK vs prurigo nodule     Actinic Keratosis(es):   - Discussed premalignant nature of lesions and therefore recommended therapy.   - Given several biopsies performed today as above, jointly elected to defer treatment today. Will address at next visit.     Benign Lesions/ Findings:   Actinic Elastosis  Angioma(s)  Lentigo/Lentigines  Nevus/Nevi-Benign Appearing  Seborrheic Keratosis(es) - no irritation noted  - Reassurance provided regarding the benign appearance of lesions noted on exam today; no treatment is indicated in the absence of symptoms/changes.  - Reinforced importance of photoprotective strategies including liberal and frequent sunscreen use of a broad-spectrum SPF 30 or greater, use of protective clothing, and sun avoidance for prevention of cutaneous malignancy and photoaging.  Counseled patient on the importance of regular self-skin monitoring as well as routine clinical skin examinations as scheduled.     Personal history of non-melanoma skin cancer   - No evidence of recurrence at this time.  - Discussed maintaining vigilance and counseled on sun protection as above.        The patient was advised to call for an appointment should any new, changing, or symptomatic lesions develop.     RTC: Return in about 3 months (around 11/10/2023) for FBSE. or sooner as needed   _________________________________________________________________      Chief Complaint     Chief Complaint   Patient presents with    Skin Check     Fbse, area of concern, spots on arms and left ear, present for a month or two, no itching, bleeding or pain        HPI     Patrick Montgomery is a 62 y.o. male who presents as a returning patient (last seen 10/09/2021) to Dermatology for a full body skin exam. Patient reports several lesions of concern.     Today he reports:  - spot on the left ear has been growing, unsure how long it has been present  - two new spots on the right arm, not symptomatic but he picks at them and is concerned about them  - non-healing wound on the left arm, reports a surgery there (unsure for what) and  says the spot never fully healed     The patient denies any other new or changing lesions or areas of concern.     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     ROS: Other than symptoms mentioned in the HPI, no fevers, chills, or other skin complaints    Physical Examination     GENERAL: Well-appearing male in no acute distress, resting comfortably.  NEURO: Alert and oriented, answers questions appropriately  PSYCH: Normal mood and affect  RESP: No increased work of breathing  SKIN (Full Skin Exam): Examination of the face, eyelids, lips, nose, ears, neck, chest, abdomen, back, arms, legs, hands, feet, palms, soles, nails was performed  - Actinic Elastosis: moderate chronic sun damage: dyspigmentation, telangiectasia, and wrinkling  - Actinic Keratosis(es): Scaly erythematous macule(s) on the face  - Angioma(s): Scattered red vascular papule(s) on the scattered diffusely  - Lentigo/lentigines: Scattered pigmented macules that are tan to brown in color and are somewhat non-uniform in shape and concentrated in the sun-exposed areas of the face, trunk, and upper extremities  - Nevus/nevi: Scattered well-demarcated, regular, pigmented macule(s) and/or papule(s) on the scattered diffusely  - Seborrheic Keratosis(es): Stuck-on appearing keratotic papule(s) on the scattered diffusely, none irritated with redness, crusting, edema, and/or partial avulsion  - Surgical Site(s): Well healed scars without nodularity, re-pigmentation, scaling, erythema, or regrowth  1.5 cm ulcerated nodule to L antihelix (biopsy site A)  1.5 cm ulcerated plaque to L forearm (biopsy site B)  1 cm pink hyper keratotic nodule at R forearm superior (biopsy site C)  1 cm pink hyper keratotic nodule at R forearm superior (biopsy site D)            All areas not commented on are within normal limits or unremarkable      (Approved Template 08/12/2020)

## 2023-08-15 DIAGNOSIS — C61 Malignant neoplasm of prostate: Principal | ICD-10-CM

## 2023-08-16 NOTE — Unmapped (Signed)
Addended by: Dwyane Luo on: 08/16/2023 04:08 PM     Modules accepted: Orders

## 2023-08-16 NOTE — Unmapped (Signed)
Please call patient to schedule mohs of BCC on L ear & excision of BCC on left forearm and KA on right forearm. If possible, schedule for 2 hr surgery slot for both excisions (L & R forearms).     Result Note:    Specimen: A  Diagnosis:  BCC  Site: left ear   Tracking:Yes.  Treatment plan:  Mohs- referral placed  ZO:XWRUEAV appointment    Specimen: B  Diagnosis:  BCC  Site: BCC   Tracking:Yes.  Treatment plan:  Excision scalp, trunk, arms, legs, axillae: 2.6 cm -7.5  12032trunk, arms, legs: 1.1-2cm  11602  WU:[id number] appointment    Specimen: C   Diagnosis:  HAK/prurigo  Site: right superior forearm   Tracking:No.  Treatment plan:  observation      Specimen: D  Diagnosis:  KA  Site: Right inferior forearm   Tracking:Yes.  Treatment plan:  Excision scalp, trunk, arms, legs, axillae: 2.6 cm -7.5  12032trunk, arms, legs: .6-1cm  11601  NW:GNFAOZH appointment      Patient notified: MYCHART. Attempted to contact by phone numbers listed x2.

## 2023-08-17 NOTE — Unmapped (Signed)
-----   Message from Ardeen Jourdain, MD sent at 08/16/2023  4:08 PM EDT -----  Please call patient to schedule mohs of BCC on L ear & excision of BCC on left forearm and KA on right forearm. If possible, schedule for 2 hr surgery slot for both excisions (L & R forearms).     Result Note:    Specimen: A  Diagnosis:  BCC  Site: left ear   Tracking:Yes.  Treatment plan:  Mohs- referral placed  ZO:XWRUEAV appointment    Specimen: B  Diagnosis:  BCC  Site: BCC   Tracking:Yes.  Treatment plan:  Excision scalp, trunk, arms, legs, axillae: 2.6 cm -7.5  12032trunk, arms, legs: 1.1-2cm  11602  WU:JWJXBJY appointment    Specimen: C   Diagnosis:  HAK/prurigo  Site: right superior forearm   Tracking:No.  Treatment plan:  observation      Specimen: D  Diagnosis:  KA  Site: Right inferior forearm   Tracking:Yes.  Treatment plan:  Excision scalp, trunk, arms, legs, axillae: 2.6 cm -7.5  12032trunk, arms, legs: .6-1cm  11601  NW:GNFAOZH appointment      Patient notified: MYCHART. Attempted to contact by phone numbers listed x2.

## 2023-08-17 NOTE — Unmapped (Addendum)
[facility] Visit Note    Patient Name: Patrick Montgomery  Patient Age: 62 y.o.  Encounter Date: 08/18/2023  Attending Provider:  Young E. 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    Patrick Montgomery is a 62 y.o. male who presents for management of newly diagnosed prostate cancer. He was initially seen in the ER In May 2023 for gross hematuria and  worsening LUTS. A CT scan of the bladder revealed a bladder mass. In June 2023, he was found to have a PSA of 18. He underwent cystoscopy, prostate biopsy, and TURP. Prostate biopsy showed Gleason 4+5=9 (GG 5), 10/12 cores positive for cancer. TURP specimen showed mixed acinar/ductal features, cribriborm morphology. In July 2023, CT and bone scan showed no pelvic or distant metastases.    On 06/18/22, we discussed management of his recent diagnosis of prostate cancer. Given that he has been diagnosed with prostate cancer at a very young age, I informed the patient that there may be features that indicate a higher risk of hereditary prostate cancer. 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   Based on current imaging, his cancer is confined to the prostate. A PSMA PET scan will be ordered to see if the cancer has metastasized.     On 07/07/2022, pt is now ready to move ahead with treatment. Will proceed with ADT plus abiraterone for two years, per STAMP[facility]E.  PSMA PET still undergoing authorization.    On 08/11/2022, most recent PSA on 08/01/2022 is 0.27. PSMA PET scan from 07/27/2022 showed avidity involving the right inferior pubic ramus which is suspicious for single site of osseous metastatic disease. Will proceed with Eligard shot and proceed with radiation with Dr. Carles Collet. May consider radiating single site of metastatic disease at the right inferior pubic ramus     On 12/03/2022, PSA <0.04, on ADT plus abi, s/p RT to all involved sites. For pain, unlikely to be from cancer, may benefit from pain clinic management long-term.    On 02/16/23, PSA remains <0.04. Received ADT today and will continue abiraterone/prednisone. BP mildly elevated at 146/81, will monitor. Chronic pain being addressed with support from palliative care team. Will RTC in 3 months.    On 05/18/2023, PSA remains undetectable. BP elevated at 166/72, potassium 3.2 today. Hold abiraterone for now.     Today, on 08/18/23, PSA remains undetectable. His BP improved from last visit at 151/74, K 4.4 and still remains on potassium supplement. He remains off of abiraterone. Has not checked BP at home. He is due for Eligard today. Given elevated BP, will increase amlodipine to 10 mg for BP management and restart abiraterone and continue prednisone.    Plan:  Continue ADT with Eligard due today, next due on 02/15/24. Anticipate 3 years of therapy (till 8-07/2025)  --Follow PSA  Restart Abiraterone 1000 mg  daily and continue prednisone 5 mg daily x 2 years (Until ~07/2024)  Potassium supplement continued  Follow labs/BP  Instructed patient to bring log of BP readings at home at next follow up  3. NGS returned without actionable mutations, germline testing negative on [facility] normal  5. Given patient reported lack of benefit of gabapentin, provided instructions to safely taper gabapentin. Reassess at follow up.  4. RTC in 1 month to see CPP/labs    Laverna Peace PharmD, BCOP, CPP  Hematology/Oncology Pharmacist  P: [phone]      Reason for Visit  Follow up of prostate cancer.     History of Present Illness:  Hematology/Oncology History Overview Note   In 03/2022, seen in ER for gross hematuria, worsening LUTS. CT bladder showing bladder mass.  In 04/2022, PSA 18. Had cysto, prostate biopsy, TURP. Prostate biopsy showed Gleason 4+5=9 (GG 5), 10/12 cores positive for cancer. TURP specimen showed mixed acinar/ductal features, cribriborm morphology.  In 05/2022, CT and bone scan showed no pelvic or distant mets  In 06/2022, ADT started with Degarelix, continued with Eligard.  In 06/2022, PSMA PET showed avidity in R inferior pubic ramus, suspicious for single bone met.  In 07/2022, abiraterone/prednisone started.  In 08/2022, RT to prostate/SV/nodes and pubic ramus bone met site, completed in 12/2022.  In 05/2023, abiraterone held due to hypertension and hypokalemia.     Prostate cancer (CMS-HCC)   06/17/2022 Initial Diagnosis    Prostate cancer (CMS-HCC)     06/17/2022 -  Cancer Staged    Staging form: Prostate, AJCC 8th Edition  - Clinical: Stage IIIC (cT4, cN0, cM0, PSA: 18, Grade Group: 5) - Signed by Maurie Boettcher, MD on 06/19/2022       07/07/2022 - 07/07/2022 Endocrine/Hormone Therapy    OP DEGARELIX  Plan Provider: Maurie Boettcher, MD     08/11/2022 Endocrine/Hormone Therapy    OP PROSTATE LEUPROLIDE (ELIGARD) 45 MG EVERY 6 MONTHS  Plan Provider: Maurie Boettcher, MD     09/10/2022 -  Radiation    Radiation Therapy Treatment Details (Noted on 09/10/2022)  Site: Prostate  Technique: IMRT  Goal: No goal specified  Planned Treatment Start Date: No planned start date specified         Interval history  The patient returns for scheduled follow up, unaccompanied. He is doing generally well. States his mood has been worsened since increasing his gabapentin and stated wanting to completely stop the medication as of 08/17/23. Continues to take oxycodone 5 mg 2-3 times a day on average but needing up to 10 mg 6-7 times a day on occasion. He denies any issues with BM. He has not been checking his BP at home. He continues to take prednisone 5 mg daily.       Allergies:  No Known Allergies    Current Medications:    Current Outpatient Medications:     abiraterone (ZYTIGA) 250 mg tablet, Take 4 tablets (1,000 mg total) by mouth daily. (Patient not taking: Reported on 07/02/2023), Disp: 120 tablet, Rfl: 11    acetaminophen (TYLENOL EXTRA STRENGTH) 500 MG tablet, Take 2 tablets (1,000 mg total) by mouth two (2) times a day as needed for pain. (Patient not taking: Reported on 08/03/2023), Disp: 100 tablet, Rfl: 2    amlodipine (NORVASC) 5 MG tablet, Take 1 tablet (5 mg total) by mouth daily., Disp: 90 tablet, Rfl: 3    aspirin (ECOTRIN) 81 MG tablet, Take 1 tablet (81 mg total) by mouth  daily., Disp: 90 tablet, Rfl: 3    atorvastatin (LIPITOR) 10 MG tablet, Take 1 tablet (10 mg total) by mouth daily. (Patient not taking: Reported on 08/03/2023), Disp: 90 tablet, Rfl: 0    cyclobenzaprine (FLEXERIL) 10 MG tablet, Take 1 tablet (10 mg total) by mouth nightly as needed for muscle spasms. (Patient not taking: Reported on 08/03/2023), Disp: 30 tablet, Rfl: 3    gabapentin (NEURONTIN) 300 MG capsule, Take 2 capsules (600 mg total) by mouth Three (3) times a day., Disp: 180 capsule, Rfl: 3    lidocaine (XYLOCAINE) 5 % ointment, Apply to affected area daily (Patient not taking: Reported on 08/03/2023), Disp: 37 g, Rfl: 0    naloxone (NARCAN) 4 mg nasal spray, One spray in either nostril once for known/suspected opioid overdose. May repeat every 2-3 minutes in alternating nostril til EMS arrives, Disp: 2 each, Rfl: 3    oxybutynin (DITROPAN) 5 MG tablet, Take 1 tablet (5 mg total) by mouth Three (3) times a day., Disp: 90 tablet, Rfl: 11    oxyCODONE (ROXICODONE) 5 MG immediate release tablet, Take 1-2 tablets (5-10 mg total) by mouth every four (4) hours as needed for pain. No more than 7 tablets per day., Disp: 196 tablet, Rfl: 0    polyethylene glycol (MIRALAX) 17 gram packet, Take 17 g by mouth two (2) times a day. (Patient not taking: Reported on 08/03/2023), Disp: 100 packet, Rfl: 2    potassium chloride (KLOR-CON) 10 MEQ CR tablet, Take 1 tablet (10 mEq total) by mouth daily., Disp: 30 tablet, Rfl: 3    predniSONE (DELTASONE) 5 MG tablet, Take 1 tablet (5 mg total) by mouth daily. (Patient not taking: Reported on 08/03/2023), Disp: 30 tablet, Rfl: 11    predniSONE (DELTASONE) 5 MG tablet, TAKE 1 TABLET (5 MG TOTAL) BY MOUTH DAILY., Disp: 30 tablet, Rfl: 11    SENNA 8.6 mg tablet, Take 2 tablets by mouth nightly. (Patient not taking: Reported on 08/03/2023), Disp: 180 tablet, Rfl: 3    tamsulosin (FLOMAX) 0.4 mg capsule, Take 1 capsule (0.4 mg total) by mouth daily. (Patient not taking: Reported on 08/03/2023), Disp: 30 capsule, Rfl: 11    Past Medical History and Social History  Past Medical History:   Diagnosis Date    Anxiety     Arthritis     Basal cell carcinoma     Cancer (CMS-HCC)     skin    Gout     Kidney stones     Squamous cell skin cancer     Stroke (CMS-HCC)       Past Surgical History:   Procedure Laterality Date    PR BIOPSY OF PROSTATE,NEEDLE/PUNCH N/A 05/21/2022    Procedure: IMAGE GUIDED BIOPSY, PROSTATE; NEEDLE OR PUNCH, SINGLE OR MULTIPLE, ANY APPROACH;  Surgeon: Phillips Grout, MD;  Location: [facility] OR [facility];  Service: Urology    PR TRANSURETHRAL ELEC-SURG PROSTATECTOM N/A 05/21/2022    Procedure: TRANSURETHRAL ELECTROSURGICAL RESECT PROSTATE, W/CONTORL POSTOP BLEED, COMPLETE (VASECT, MEATOT, CYSTO ETC);  Surgeon: Phillips Grout, MD;  Location: [facility];  Service: Urology    SKIN BIOPSY      skin cancer removal           Social History     Occupational History    Not on file   Tobacco Use    Smoking status: Every Day     Current packs/day: 1.50     Average packs/day: 1.5 packs/day for 45.0 years (  67.5 ttl pk-yrs)     Types: Cigarettes    Smokeless tobacco: Never   Vaping Use    Vaping status: Never Used   Substance and Sexual Activity    Alcohol use: No     Comment: socially     Drug use: Yes     Types: Marijuana    Sexual activity: Not Currently     Partners: Female     Birth control/protection: None       Family History  Family History   Problem Relation Age of Onset    Cancer Father         Skin    Melanoma Father     Squamous cell carcinoma Father     Rashes / Skin problems Father     Cancer Brother         skin     Substance Abuse Disorder Neg Hx      Prostate Cancer Family History Assessment:  History of cancer in children (yes/no; if yes, what type AND age of diagnosis): No  Total number of siblings (including deceased): 2  History of cancer in siblings (yes/no; if yes, provide relation, type of cancer, AND age of diagnosis): Brother superficial basal cell; age unknown  History of cancer in parents (yes/no; if yes, please specify parent, type of cancer, AND age of diagnosis): Father superficial basal cell carcinoma; age unknown  History of cancer in aunts/uncles/grandparents (yes/no; if yes, provide relation, type of cancer, AND age of diagnosis): No      Review of Systems:  A comprehensive review of 10 systems was negative except for pertinent positives noted in HPI.    Physical Exam:    VITAL SIGNS:  There were no vitals taken for this visit.  ECOG Performance Status: 1  GENERAL: Well-developed, well-nourished patient in no acute distress.  HEAD: Normocephalic and atraumatic.  EYES: Conjunctivae are normal. No scleral icterus.  MOUTH/THROAT: Oropharynx is clear and moist.  No mucosal lesions.  NECK: Supple, no thyromegaly.  LYMPHATICS: No palpable cervical, supraclavicular, or axillary adenopathy.  CARDIOVASCULAR: Normal rate, regular rhythm and normal heart sounds.  Exam reveals no gallop and no friction rub.  No murmur heard.  PULMONARY/CHEST: Effort normal and breath sounds normal. No respiratory distress.  ABDOMINAL:  Soft. There is no distension. There is no tenderness. There is no rebound and no guarding.  MUSCULOSKELETAL: No clubbing, cyanosis, or lower extremity edema.  PSYCHIATRIC: Alert and oriented.  Normal mood and affect.  NEUROLOGIC: No focal motor deficit. Normal gait.  SKIN: Skin is warm, dry, and intact.      Results/Orders:    Office Visit on 08/11/2023   Component Date Value Ref Range Status    Case Report 08/11/2023    Final                    Value:Surgical Pathology Report                         Case: [id number]                                 Authorizing Provider:  Marylene Land, MD Collected:           08/11/2023 0840              Ordering Location:     [facility]   Received:  08/11/2023 1612                                     [facility]                                                   Pathologist:           Abbie Sons, MD                                                        Specimens:   A) - Skin, Left ear, shave                                                                          B) - Skin, Left forearm, shave                                                                      C) - Skin, Right superior forearm, shave                                                            D) - Skin, Right inferior forearm, shave                                                   Diagnosis 08/11/2023    Final                    Value:A:  Left ear, shave  - Basal cell carcinoma, nodular type, present in base and edges of biopsy    B:  Left forearm, shave  - Basal cell carcinoma, nodular-infiltrating type, present in base of biopsy and edge    C:  Right superior forearm, shave  - 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    A shave of skin was received labeled with the patient's name and measured 15 x 11 x 3 mm, serially sectioned, NTR.    A shave of skin was received labeled with the patient's name and measured 10 x 9 x 4 mm, serially sectioned, NTR.    A shave of skin was received labeled with the patient's name and measured 10 x 10 x 3 mm, serially sectioned, NTR.        Microscopic Description 08/11/2023    Final                    Value:Light microscopy substantiates the above diagnosis.          Lab Results   Component Value Date    PSA <0.04 05/18/2023    PSA <0.04 02/16/2023    PSA <0.04 01/07/2023    PSA <0.04 12/03/2022    PSA <0.04 10/30/2022    PSA <0.04 10/16/2022    PSA 0.09 10/05/2022    PSA 0.04 09/21/2022    PSA 0.09 09/07/2022    PSA 0.14 08/21/2022         Orders placed or performed in visit on 08/15/23    Therapeutic Injection Appointment Request       Molecular Pathology  Tumor mutation profiling  [facility]: ZMYM3 mutation, TMB 3.7, MSS, no actionable mutations    Germline testing  Negative on [facility] normal draw    Pathology:  A: Prostate, right apex, core biopsy  - Prostatic adenocarcinoma, Gleason score 4 + 4 = 8 (Grade group 4) involving 2 of 2 cores, approximately 4 and 1 mm in linear extent, approximately 20% of total core length.      B: Prostate, left apex, core biopsy  - Prostatic adenocarcinoma, Gleason score 4 + 4 = 8 (Grade group 4) involving 2 of 2 cores, approximately 7 and 5 mm in linear extent, approximately 60% of total core length.   - Perineural invasion identified in this case.      C: Prostate, left mid, core biopsy  - Prostatic adenocarcinoma, Gleason score 4 + 5 = 9 (Grade group 5) involving 2 of 2 cores, approximately 6 and 3mm in linear extent, approximately 90% of total core length.      D: Prostate, right mid, core biopsy  - Prostatic adenocarcinoma, Gleason score 4 + 5 = 9 (Grade group 5) involving 2 of 2 cores, approximately 6 and 5 mm in linear extent, approximately 70% of total core length.      E: Prostate, right base, core biopsy  - Benign prostate tissue.      F: Prostate, left base, core biopsy  - Prostatic adenocarcinoma, Gleason score 4 + 4 = 8 (Grade group 4)  involving 2 of 2 cores, approximately 3 and 1 mm in linear extent, approximately 20% of total core length.      G: Bladder neck and bilateral median lobe, transurethral resection  - Involved by prostatic adenocarcinoma with mixed acinar and ductal features, Gleason score  4 + 4 = 8 (Grade group 4)    - Lymphovascular invasion identified     The pattern 4 in this case demonstrates cribriform morphology.      Imaging results:    CT Chest w Contrast 06/11/22:   No intrathoracic metastasis.       CT Abdomen Pelvis W Contrast 06/11/22:     Enlarged irregular prostate correlates with history of biopsy-proven prostate cancer. There is redemonstrated irregularity of the posterior bladder and increased irregularity of the prostate at the bladder margin compared to prior may be secondary to tumor versus postsurgical/post TURP change. Consider attention on follow-up versus further evaluation with PSMA PET scan.       Questionable sclerotic changes of the right inferior pubic ramus are indeterminate and may represent normal variation although metastatic osseous disease is not excluded. Attention on follow-up bone scan. There is no other evidence of metastatic disease in the abdomen/pelvis.       Additional chronic and incidental findings as described.       Please see same day CT chest report for detailed findings above the diaphragm     NM Bone Scan Whole Body 06/11/22  No findings suggestive of osseous metastatic disease     PET CT 07/27/2022:   Impression   1.Ill-defined tracer avidity associated with the prostate, left greater than right, likely representing biopsy-proven prostate carcinoma.   2.Moderate avidity involving the right inferior pubic ramus which is suspicious for single site of osseous metastatic disease. Otherwise, no definite evidence of additional metastatic disease. More specifically, no avid adenopathy. Subcentimeter pulmonary nodules which are below the resolution of PET/CT and require continued attention on follow-up.

## 2023-08-18 ENCOUNTER — Ambulatory Visit
Admit: 2023-08-18 | Discharge: 2023-08-18 | Payer: PRIVATE HEALTH INSURANCE | Attending: Pharmacist Clinician (PhC)/ Clinical Pharmacy Specialist | Primary: Pharmacist Clinician (PhC)/ Clinical Pharmacy Specialist

## 2023-08-18 ENCOUNTER — Institutional Professional Consult (permissible substitution): Admit: 2023-08-18 | Discharge: 2023-08-18 | Payer: PRIVATE HEALTH INSURANCE

## 2023-08-18 ENCOUNTER — Ambulatory Visit: Admit: 2023-08-18 | Discharge: 2023-08-18 | Payer: PRIVATE HEALTH INSURANCE

## 2023-08-18 ENCOUNTER — Other Ambulatory Visit: Admit: 2023-08-18 | Discharge: 2023-08-18 | Payer: PRIVATE HEALTH INSURANCE

## 2023-08-18 DIAGNOSIS — C61 Malignant neoplasm of prostate: Principal | ICD-10-CM

## 2023-08-18 DIAGNOSIS — E785 Hyperlipidemia, unspecified: Principal | ICD-10-CM

## 2023-08-18 DIAGNOSIS — I63231 Cerebral infarction due to unspecified occlusion or stenosis of right carotid arteries: Principal | ICD-10-CM

## 2023-08-18 LAB — HEPATIC FUNCTION PANEL
ALBUMIN: 3.6 g/dL (ref 3.4–5.0)
ALKALINE PHOSPHATASE: 84 U/L (ref 46–116)
ALT (SGPT): 11 U/L (ref 10–49)
AST (SGOT): 16 U/L (ref <=34)
BILIRUBIN DIRECT: 0.1 mg/dL (ref 0.00–0.30)
BILIRUBIN TOTAL: 0.3 mg/dL (ref 0.3–1.2)
PROTEIN TOTAL: 6.5 g/dL (ref 5.7–8.2)

## 2023-08-18 LAB — PSA: PROSTATE SPECIFIC ANTIGEN: <0.04 ng/mL (ref 0.00–4.00)

## 2023-08-18 LAB — POTASSIUM: POTASSIUM: 4.4 mmol/L (ref 3.5–5.1)

## 2023-08-18 MED ORDER — PREDNISONE 5 MG TABLET
ORAL_TABLET | Freq: Every day | ORAL | 11 refills | 30 days | Status: CP
Start: 2023-08-18 — End: ?

## 2023-08-18 MED ORDER — ATORVASTATIN 10 MG TABLET
ORAL | 0 refills | 90 days | Status: CP
Start: 2023-08-18 — End: ?

## 2023-08-18 MED ORDER — ABIRATERONE 250 MG TABLET
ORAL_TABLET | Freq: Every day | ORAL | 11 refills | 30 days | Status: CP
Start: 2023-08-18 — End: ?
  Filled 2023-08-30: qty 120, 30d supply, fill #0

## 2023-08-18 MED ORDER — AMLODIPINE 10 MG TABLET
ORAL | 11 refills | 30 days | Status: CP
Start: 2023-08-18 — End: 2024-08-17

## 2023-08-18 MED ADMIN — leuprolide acetate (6 month) (ELIGARD) injection 45 mg: 45 mg | SUBCUTANEOUS | @ 15:00:00 | Stop: 2023-08-18

## 2023-08-18 NOTE — Unmapped (Addendum)
I saw and evaluated the patient, participating in the key elements of the service.  I discussed the findings, assessment and plan with the Resident and agree with the Resident???s findings and plan as documented in the Resident???s note.  I was present for the entirety of procedures taking less than 5 minutes and was present for the key and critical portions and immediately available for the entirety of procedure(s) taking 5 or more minutes.   Janece Canterbury, MD

## 2023-08-18 NOTE — Unmapped (Unsigned)
Eligard 45mg  subcutaneous injection given to LLA. Patient tolerated injection well. Patient left clinic walking with no complications noted.

## 2023-08-19 NOTE — Unmapped (Unsigned)
LVM 9/19    [facility] Clinical Assessment & Refill Coordination Note    Patrick Montgomery, DOB: 1961-11-10  Phone: There are no phone numbers on file.    All above HIPAA information was verified with {Blank:19197::patient.,patient's caregiver, ***.,patient's family member, ***.}     Was a Nurse, learning disability used for this call? {Blank single:19197::Yes, ***. Patient language is appropriate in WAM,No}    Specialty Medication(s):   Hematology/Oncology: abiraterone 250 mg     Current Outpatient Medications   Medication Sig Dispense Refill    abiraterone (ZYTIGA) 250 mg tablet Take 4 tablets (1,000 mg total) by mouth daily. 120 tablet 11    amlodipine (NORVASC) 10 MG tablet Take 1 tablet (10 mg total) by mouth daily. 30 tablet 11    aspirin (ECOTRIN) 81 MG tablet Take 1 tablet (81 mg total) by mouth daily. 90 tablet 3    atorvastatin (LIPITOR) 10 MG tablet Take 1 tablet (10 mg total) by mouth daily. 90 tablet 0    gabapentin (NEURONTIN) 300 MG capsule Take 2 capsules (600 mg total) by mouth Three (3) times a day. 180 capsule 3    naloxone (NARCAN) 4 mg nasal spray One spray in either nostril once for known/suspected opioid overdose. May repeat every 2-3 minutes in alternating nostril til EMS arrives 2 each 3    oxyCODONE (ROXICODONE) 5 MG immediate release tablet Take 1-2 tablets (5-10 mg total) by mouth every four (4) hours as needed for pain. No more than 7 tablets per day. 196 tablet 0    polyethylene glycol (MIRALAX) 17 gram packet Take 17 g by mouth two (2) times a day. 100 packet 2    potassium chloride (KLOR-CON) 10 MEQ CR tablet Take 1 tablet (10 mEq total) by mouth daily. 30 tablet 3    predniSONE (DELTASONE) 5 MG tablet Take 1 tablet (5 mg total) by mouth daily. 30 tablet 11    SENNA 8.6 mg tablet Take 2 tablets by mouth nightly. 180 tablet 3     No current facility-administered medications for this visit.        Changes to medications: {Blank:19197::Zanden reports starting the following medications: ***,Jake Shark Reports stopping the following medications: ***,Jashaun reports no changes at this time.}    No Known Allergies    Changes to allergies: {Blank:19197::Yes: ***,No}    SPECIALTY MEDICATION ADHERENCE     *** *** {Blank:19197::mg,mg/ml,***}: *** {Blank:19197::days,doses} of medicine on hand   *** *** {Blank:19197::mg,mg/ml,***}: *** {Blank:19197::days,doses} of medicine on hand   *** *** {Blank:19197::mg,mg/ml,***}: *** {Blank:19197::days,doses} of medicine on hand   *** *** {Blank:19197::mg,mg/ml,***}: *** {Blank:19197::days,doses} of medicine on hand   *** *** {Blank:19197::mg,mg/ml,***}: *** {Blank:19197::days,doses} of medicine on hand          Specialty medication(s) dose(s) confirmed: {Blank:19197::Regimen is correct and unchanged.,Patient reports changes to the regimen as follows: ***}     Are there any concerns with adherence? {Blank:19197::Yes: ***,No}    Adherence counseling provided? {Blank:19197::Yes: ***,Not needed}    CLINICAL MANAGEMENT AND INTERVENTION      Clinical Benefit Assessment:    Do you feel the medicine is effective or helping your condition? {Blank:19197::Yes,No,Patient declined to answer}    Clinical Benefit counseling provided? {Blank:19197::Not needed,Reasonable expectations discussed: ***,Labs from *** show evidence of clinical benefit,Progress note from *** shows evidence of clinical benefit,consulted provider regarding clinical benefit concerns,***}    Adverse Effects Assessment:    Are you experiencing any side effects? {Blank:19197::Yes, patient reports experiencing ***. Side effect counseling provided: ***,  No}    Are you experiencing difficulty administering your medicine? {Blank:19197::Yes, patient reports ***. Medication administration counseling provided: ***,No}    Quality of Life Assessment:    Quality of Life Rheumatology  Oncology  Dermatology  Cystic Fibrosis          {DiseaseSpecificQOL:73897}    Have you discussed this with your provider? {Blank:19197::Not needed,Yes,No - pharmacist will consult provider}    Acute Infection Status:    Acute infections noted within Epic:  No active infections  Patient reported infection: {Blank single:19197::None,***- patient reported to provider,***- pharmacy reported to provider}    Therapy Appropriateness:    Is therapy appropriate based on current medication list, adverse reactions, adherence, clinical benefit and progress toward achieving therapeutic goals? {Blank:19197::Yes, therapy is appropriate and should be continued,Pharmacist will consult provider}     DISEASE/MEDICATION-SPECIFIC INFORMATION      {clinicspecificinstructions:59274}    {DISEASESTATESPECIFICASSESSMENT:98894}    PATIENT SPECIFIC NEEDS     Does the patient have any physical, cognitive, or cultural barriers? {Blank single:19197::No,Yes - ***}    Is the patient high risk? {sschighriskpts:78327}    Did the patient require a clinical intervention? {Blank single:19197::No,Yes (If yes, document using the sscrphintervention smartphrase)}    Does the patient require physician intervention or other additional services (i.e., nutrition, smoking cessation, social work)? {Blank single:19197::No,Yes, ***}    SOCIAL DETERMINANTS OF HEALTH     At the [facility], we have learned that life circumstances - like trouble affording food, housing, utilities, or transportation can affect the health of many of our patients.   That is why we wanted to ask: are you currently experiencing any life circumstances that are negatively impacting your health and/or quality of life? {YES/NO/PATIENTDECLINED:93004}    Social Determinants of Health     Financial Resource Strain: Low Risk  (05/06/2022)    Overall Financial Resource Strain (CARDIA)     Difficulty of Paying Living Expenses: Not hard at all Internet Connectivity: No Internet connectivity concern identified (05/06/2022)    Internet Connectivity     Do you have access to internet services: Yes     How do you connect to the internet: Personal Device at home     Is your internet connection strong enough for you to watch video on your device without major problems?: Yes     Do you have enough data to get through the month?: Yes     Does at least one of the devices have a camera that you can use for video chat?: Yes   Food Insecurity: No Food Insecurity (05/06/2022)    Hunger Vital Sign     Worried About Running Out of Food in the Last Year: Never true     Ran Out of Food in the Last Year: Never true   Tobacco Use: High Risk (08/11/2023)    Patient History     Smoking Tobacco Use: Every Day     Smokeless Tobacco Use: Never     Passive Exposure: Not on file   Housing/Utilities: Low Risk  (05/06/2022)    Housing/Utilities     Within the past 12 months, have you ever stayed: outside, in a car, in a tent, in an overnight shelter, or temporarily in someone else's home (i.e. couch-surfing)?: No     Are you worried about losing your housing?: No     Within the past 12 months, have you been unable to get utilities (heat, electricity) when it was really needed?: No   Alcohol Use: Not  on file   Transportation Needs: No Transportation Needs (05/06/2022)    PRAPARE - Therapist, art (Medical): No     Lack of Transportation (Non-Medical): No   Substance Use: Not on file   Health Literacy: Not on file   Physical Activity: Not on file   Interpersonal Safety: Unknown (08/19/2023)    Interpersonal Safety     Unsafe Where You Currently Live: Not on file     Physically Hurt by Anyone: Not on file     Abused by Anyone: Not on file   Stress: Not on file   Intimate Partner Violence: Not At Risk (05/06/2022)    Humiliation, Afraid, Rape, and Kick questionnaire     Fear of Current or Ex-Partner: No     Emotionally Abused: No     Physically Abused: No     Sexually Abused: No   Depression: Not at risk (06/22/2022)    PHQ-2     PHQ-2 Score: 0   Social Connections: Not on file       Would you be willing to receive help with any of the needs that you have identified today? {Yes/No/Not applicable:93005}       SHIPPING     Specialty Medication(s) to be Shipped:   {specpharm:59087}    Other medication(s) to be shipped: {Blank:19197::***,No additional medications requested for fill at this time}     Changes to insurance: {Blank:19197::Yes: ***,No}    Delivery Scheduled: {Blank:19197::Yes, Expected medication delivery date: ***.,Yes, Expected medication delivery date: ***.  However, Rx request for refills was sent to the provider as there are none remaining.,Patient declined refill at this time due to ***.,No, cannot schedule delivery at this time as there are outstanding items that need addressed.  This note has been handed off to the provider for follow up.,Due to patient insurance changes, unable to fill at [facility], please route Rx to *** specialty pharmacy}     Medication will be delivered via {Blank:19197::UPS,Next Day Courier,Same Day Courier,Clinic Courier - *** clinic,***} to the confirmed {Blank:19197::prescription,temporary} address in [facility].    The patient will receive a drug information handout for each medication shipped and additional FDA Medication Guides as required.  Verified that patient has previously received a Conservation officer, historic buildings and a Surveyor, mining.    The patient or caregiver noted above participated in the development of this care plan and knows that they can request review of or adjustments to the care plan at any time.      All of the patient's questions and concerns have been addressed.    Burley Saver, PharmD   [facility] Specialty Pharmacist

## 2023-08-19 NOTE — Unmapped (Signed)
Called patient to encourage him to make a PCP appointment since he hasn't been recently.  No answer. Send [employer].

## 2023-08-23 NOTE — Unmapped (Signed)
-----   Message from Ardeen Jourdain, MD sent at 08/17/2023 10:33 AM EDT -----  That'll be fine! Whatever is most convenient for the patient!  ----- Message -----  From: Ricky Stabs  Sent: 08/17/2023  10:16 AM EDT  To: Marylene Land, MD; #    Since there are no multiple hour surgeries at [facility],  Is it ok to schedule the surgery on 09-22-2023 @ 10 w/Davari?  Otherwise it will be single surgeries or go to [facility]  ----- Message -----  From: Dwyane Luo, MD  Sent: 08/16/2023   4:08 PM EDT  To: Harolyn Rutherford; #    Please call patient to schedule mohs of BCC on L ear & excision of BCC on left forearm and KA on right forearm. If possible, schedule for 2 hr surgery slot for both excisions (L & R forearms).     Result Note:    Specimen: A  Diagnosis:  BCC  Site: left ear   Tracking:Yes.  Treatment plan:  Mohs- referral placed  ZO:XWRUEAV appointment    Specimen: B  Diagnosis:  BCC  Site: BCC   Tracking:Yes.  Treatment plan:  Excision scalp, trunk, arms, legs, axillae: 2.6 cm -7.5  12032trunk, arms, legs: 1.1-2cm  11602  WU:JWJXBJY appointment    Specimen: C   Diagnosis:  HAK/prurigo  Site: right superior forearm   Tracking:No.  Treatment plan:  observation      Specimen: D  Diagnosis:  KA  Site: Right inferior forearm   Tracking:Yes.  Treatment plan:  Excision scalp, trunk, arms, legs, axillae: 2.6 cm -7.5  12032trunk, arms, legs: .6-1cm  11601  NW:GNFAOZH appointment      Patient notified: MYCHART. Attempted to contact by phone numbers listed x2.

## 2023-08-24 NOTE — Unmapped (Signed)
-----  Message from Ardeen Jourdain, MD sent at 08/16/2023  4:08 PM EDT -----  Please call patient to schedule mohs of BCC on L ear & excision of BCC on left forearm and KA on right forearm. If possible, schedule for 2 hr surgery slot for both excisions (L & R forearms).     Result Note:    Specimen: A  Diagnosis:  BCC  Site: left ear   Tracking:Yes.  Treatment plan:  Mohs- referral placed  UU:VOZDGUY appointment    Specimen: B  Diagnosis:  BCC  Site: BCC   Tracking:Yes.  Treatment plan:  Excision scalp, trunk, arms, legs, axillae: 2.6 cm -7.5  12032trunk, arms, legs: 1.1-2cm  11602  QI:HKVQQVZ appointment    Specimen: C   Diagnosis:  HAK/prurigo  Site: right superior forearm   Tracking:No.  Treatment plan:  observation      Specimen: D  Diagnosis:  KA  Site: Right inferior forearm   Tracking:Yes.  Treatment plan:  Excision scalp, trunk, arms, legs, axillae: 2.6 cm -7.5  12032trunk, arms, legs: .6-1cm  11601  DG:LOVFIEP appointment      Patient notified: MYCHART. Attempted to contact by phone numbers listed x2.

## 2023-08-25 ENCOUNTER — Ambulatory Visit
Admit: 2023-08-25 | Discharge: 2023-08-26 | Payer: PRIVATE HEALTH INSURANCE | Attending: Student in an Organized Health Care Education/Training Program | Primary: Student in an Organized Health Care Education/Training Program

## 2023-08-25 DIAGNOSIS — G588 Other specified mononeuropathies: Principal | ICD-10-CM

## 2023-08-25 DIAGNOSIS — G893 Neoplasm related pain (acute) (chronic): Principal | ICD-10-CM

## 2023-08-25 NOTE — Unmapped (Signed)
-----  Message from Ardeen Jourdain, MD sent at 08/16/2023  4:08 PM EDT -----  Please call patient to schedule mohs of BCC on L ear & excision of BCC on left forearm and KA on right forearm. If possible, schedule for 2 hr surgery slot for both excisions (L & R forearms).     Result Note:    Specimen: A  Diagnosis:  BCC  Site: left ear   Tracking:Yes.  Treatment plan:  Mohs- referral placed  UU:VOZDGUY appointment    Specimen: B  Diagnosis:  BCC  Site: BCC   Tracking:Yes.  Treatment plan:  Excision scalp, trunk, arms, legs, axillae: 2.6 cm -7.5  12032trunk, arms, legs: 1.1-2cm  11602  QI:HKVQQVZ appointment    Specimen: C   Diagnosis:  HAK/prurigo  Site: right superior forearm   Tracking:No.  Treatment plan:  observation      Specimen: D  Diagnosis:  KA  Site: Right inferior forearm   Tracking:Yes.  Treatment plan:  Excision scalp, trunk, arms, legs, axillae: 2.6 cm -7.5  12032trunk, arms, legs: .6-1cm  11601  DG:LOVFIEP appointment      Patient notified: MYCHART. Attempted to contact by phone numbers listed x2.

## 2023-08-25 NOTE — Unmapped (Unsigned)
[address]  [facility] [address]  [place] [address]  [phone]    Date: August 24, 2023  Patient Name: Patrick Montgomery  MRN: [id number]  PCP: Deneise Lever  Referring Provider: Ortencia Kick, *    Assessment:        No diagnosis found.    Patrick Montgomery is a 62 y.o. male with a PMH of anxiety, arthritis, gout, kidney stones, cancer, and stroke who [facility] for cancer related right rectal and right lower extremity pain.    Per chart review the patient was referred to our clinic by palliative medicine to evaluate persistent R rectal/RLE pain s/p XRT. Eval for procedural intervanetion. IF not good option, would wonder what imaging would be ideal for him.           No follow-ups on file.        Requested Prescriptions      No prescriptions requested or ordered in this encounter     No orders of the defined types were placed in this encounter.                 Subjective:      HPI:    Today, ***    Current Medication Regimen:  Gabapentin 300 mg 6x per day  Flexeril 10 mg every day   Oxycodone 5 mg 7x per day      Mr. Ozment is seen in consultation at the request of Ortencia Kick, * for evaluation and recommendations regarding his chronic pain.  The pain began *** and is located in his ***.  The pain is described as {PAIN DESCRIPTION:[phone]}.  At worst it rates {numbers 0-10:5044}/10 and at best {numbers 0-10:5044}/10.  On average over the last month, the pain has been a {numbers 0-10:5044}/10.  It is a {AMB WND Constant/Intermittent:25166} pain.    His pain symptoms are associated with {PAIN SYMPTOMS:7026287734}.  The patient {HAS HAS GMW:10272} experienced lost of bowel or bladder control.  His pain is made worse with {PAIN WORSE ZDGU:[id number]}.  He does get some relief from {PAIN IMPROVEMENT:[phone]}.     Patient {HAS HAS DGL:87564} been seen in a previous pain clinic.    Previous treatments include: {nikatrials:26567}    Medications tried include:  NSAIDS- {NSAIDs:27043}  Antidepressants- {Antidepressants:27045}  Neuroleptics- {bacneurolep:27890}  Muscle relaxants- {Muscle relaxants:27044}  Topicals- {bactop:27887}  Short-acting opiates- {bacsop:27893}  Long-acting opiates- {baclop:27894}  Anxiolytics- {bacanx:28400}    Previous interventional procedures include: {bacint:27896}    Previous imaging/diagnostic studies include:     CT Abdomen pelvis 12/15/22  FINDINGS  LOWER CHEST: Unremarkable.      LIVER: Ill-defined hypodensities in the right hepatic lobe with surrounding nodular enhancement consistent with prior and not FDG avid on most recent PET/CT suggestive of hemangioma (i.e. 2:21, 36). No suspicious hepatic lesions. Normal liver contour.      BILIARY: The gallbladder is normal in appearance. No biliary ductal dilatation.        SPLEEN: Normal in size and contour.      PANCREAS: Normal pancreatic contour.  No focal lesions.  No ductal dilation.      ADRENAL GLANDS: Normal appearance of the adrenal glands.      KIDNEYS/URETERS: Symmetric renal enhancement. Nonobstructive calculi in the left lower pole measuring 4 mm (2:48) and left mid kidney measuring 3 mm (2:43). No hydronephrosis. No solid renal mass.  BLADDER: Unremarkable.      REPRODUCTIVE ORGANS: Sequela of TURP with dense calcifications throughout the prostate.      GI TRACT: Mild rectosigmoid wall thickening without surrounding inflammatory changes. Sigmoid diverticulosis. Moderate colonic stool with fecalization of distal small bowel contents suggestive of slow colonic transit. Normal appendix.      PERITONEUM, RETROPERITONEUM AND MESENTERY: No free air.  No ascites.  No fluid collection.      LYMPH NODES: No adenopathy.      VESSELS: Hepatic and portal veins are patent.  Normal caliber aorta with extensive calcified atherosclerosis throughout the aorta and common iliac arteries.      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 Moderate degenerative changes of the spine and hips. No focal soft tissue lesions.       IMPRESSION  --Mild rectosigmoid wall thickening without surrounding inflammatory changes, favored secondary to radiation changes, with additional imaging findings suggestive of slow bowel transit.      --Increased conspicuity of right inferior pubic ramus mixed lytic sclerotic lesion which may reflect posttreatment changes versus worsening site of disease. No new osseous metastases.       The patient's visit does not involve Workman's Compensation nor is he involved in litigation regarding this pain.    Mr. Baldry goals include: {bacgoal:28286}      Past Medical History:   Diagnosis Date    Anxiety     Arthritis     Basal cell carcinoma     Cancer (CMS-HCC)     skin    Gout     Kidney stones     Squamous cell skin cancer     Stroke (CMS-HCC)      Past Surgical History:   Procedure Laterality Date    PR BIOPSY OF PROSTATE,NEEDLE/PUNCH N/A 05/21/2022    Procedure: IMAGE GUIDED BIOPSY, PROSTATE; NEEDLE OR PUNCH, SINGLE OR MULTIPLE, ANY APPROACH;  Surgeon: Phillips Grout, MD;  [facility];  Service: Urology    PR TRANSURETHRAL ELEC-SURG PROSTATECTOM N/A 05/21/2022    Procedure: TRANSURETHRAL ELECTROSURGICAL RESECT PROSTATE, W/CONTORL POSTOP BLEED, COMPLETE (VASECT, MEATOT, CYSTO ETC);  Surgeon: Phillips Grout, MD;  [facility]: [facility];  Service: Urology    SKIN BIOPSY      skin cancer removal        Family History   Problem Relation Age of Onset    Cancer Father         Skin    Melanoma Father     Squamous cell carcinoma Father     Rashes / Skin problems Father     Cancer Brother         skin     Substance Abuse Disorder Neg Hx        Social History:  He reports that he has been smoking cigarettes. He has a 67.5 pack-year smoking history. He has never used smokeless tobacco. He reports current drug use. Drug: Marijuana. He reports that he does not drink alcohol.    Allergies as of 08/25/2023    (No Known Allergies)      Current Outpatient Medications   Medication Sig Dispense Refill    abiraterone (ZYTIGA) 250 mg tablet Take 4 tablets (1,000 mg total) by mouth daily. 120 tablet 11    amlodipine (NORVASC) 10 MG tablet Take 1 tablet (10 mg total) by mouth daily.  30 tablet 11    aspirin (ECOTRIN) 81 MG tablet Take 1 tablet (81 mg total) by mouth daily. 90 tablet 3    atorvastatin (LIPITOR) 10 MG tablet Take 1 tablet (10 mg total) by mouth daily. 90 tablet 0    gabapentin (NEURONTIN) 300 MG capsule Take 2 capsules (600 mg total) by mouth Three (3) times a day. 180 capsule 3    naloxone (NARCAN) 4 mg nasal spray One spray in either nostril once for known/suspected opioid overdose. May repeat every 2-3 minutes in alternating nostril til EMS arrives 2 each 3    oxyCODONE (ROXICODONE) 5 MG immediate release tablet Take 1-2 tablets (5-10 mg total) by mouth every four (4) hours as needed for pain. No more than 7 tablets per day. 196 tablet 0    polyethylene glycol (MIRALAX) 17 gram packet Take 17 g by mouth two (2) times a day. 100 packet 2    potassium chloride (KLOR-CON) 10 MEQ CR tablet Take 1 tablet (10 mEq total) by mouth daily. 30 tablet 3    predniSONE (DELTASONE) 5 MG tablet Take 1 tablet (5 mg total) by mouth daily. 30 tablet 11    SENNA 8.6 mg tablet Take 2 tablets by mouth nightly. 180 tablet 3     No current facility-administered medications for this visit.         Review of Systems:  GENERAL: {nikaROSgen:23990}  HEENT: {NIKAROSENT:24068}  SKIN:{nikaROSskin:23995}  PULMONARY:{nikarospulm:24070}  ENDOCRINE: {nikaROSendoc:23997}  GASTROINTESTINAL:{nikaROSGI:23994:a}  GENITOURINARY:{nikarosgu:24073:a}  MUSCULOSKELETAL:{nikaROSmusculosk:23999:a}  CARDIOVASCULAR:{nikaROSCV:23991:a}  NEUROLOGIC:{nikaROSneur:24000:a}  PSYCHIATRIC: {nikaROSpsych:24001:a}  HEMATOLOGIC: {nikaroshema:24075:a}  IMMUNOLOGIC: {nikarosimmuno:24076:a}       Objective:      PHYSICAL EXAM:    VITALS: There were no vitals filed for this visit.  GENERAL:  The patient is well developed, well-nourished and appears to be in no apparent distress. The patient is pleasant and interactive. Patient is a good historian.  HEENT:    Reveals normocephalic/atraumatic. Clear sclera. Mucous membranes are moist.  RESPIRATORY:   Normal work of breathing.  No supplemental oxygen.    NEUROLOGIC:    The patient was alert and oriented, speech fluent, normal language. Reflexes were {Exam; reflexes:19581} in the {DESC; UPPER/LOWER/BOTH:18669} extremities bilaterally.  No clonus present.  MUSCULOSKELETAL:    Motor function  {Pain Motor Numbers:[phone]::5/5} in {Desc; upper/lower:12526} extremities with normal tone and bulk. Good range of motion of extremities. The patient was able to ambulate without difficulty throughout the clinic today {With/without:5700} the assistance of a walking aid.    SKIN:   No obvious rashes, lesions, or erythema.  PSYCHIATRIC:  Appropriate mood and affect.  No pressured speech.

## 2023-08-25 NOTE — Unmapped (Addendum)
We will order an MRI of your pelvis to better determine what could be causing your pain.    After reviewing the MRI results, we may discuss recommending procedures such as a nerve block to alleviate the pain.    Follow up with [name] in 4-6 weeks.

## 2023-08-31 NOTE — Unmapped (Signed)
Called patient and got voicemail.  I left a voicemail for the patient instructing him to call and schedule appt with his PCP. I provide the phone number for patient to call.,

## 2023-09-02 NOTE — Unmapped (Addendum)
Called patient and no answer.  Sent mychart message to patient requesting that he scheduling an appointment with his PCP as soon as possible to help manage his mediations and other medical conditions.        ----- Message from [name], FNP sent at 08/26/2023  4:46 PM EDT -----  Regarding: RE: Follow with PCP  Hi    Okay, keep [name] posted  Thanks  AM  ----- Message -----  From: Harley Hallmark, RN  Sent: 08/26/2023   4:04 PM EDT  To: Deneise Lever, FNP; #  Subject: RE: Follow with PCP                              Thanks for trying to reach him.  I will try reaching out to him as well.    Kind regards,  [name]  ----- Message -----  From: Deneise Lever, FNP  Sent: 08/26/2023   3:48 PM EDT  To: Daylene Posey, CPP; #  Subject: RE: Follow with PCP                              Hi    We tried to reach him--no response  Thanks  AM  ----- Message -----  From: Harley Hallmark, RN  Sent: 08/26/2023   2:12 PM EDT  To: Deneise Lever, FNP; #  Subject: RE: Follow with PCP                              Just following up.  Are they having trouble reaching [name] to schedule him a PCP appointment?    Thanks,  Development worker, international aid  ----- Message -----  From: Deneise Lever, FNP  Sent: 08/20/2023   3:49 PM EDT  To: Daylene Posey, CPP; #  Subject: RE: Follow with PCP                              Hi    I sent the message to front desk who will reach out to Mr Froman.  Thanks  AM  ----- Message -----  From: Harley Hallmark, RN  Sent: 08/20/2023   1:57 PM EDT  To: Deneise Lever, FNP; #  Subject: FW: Follow with PCP                              Sue Lush,    Will you have your front desk staff to call to make an appointment for [name] to follow up with you?    Thanks,  Development worker, international aid  ----- Message -----  From: Margarite Gouge, PharmD  Sent: 08/18/2023  10:32 AM EDT  To: Daylene Posey, CPP; #  Subject: Follow with PCP                                  [name],     We saw [name] today and it looks like he has not been in touch with PCP in a while. Would you be able to get him in contact with his PCP to manage his comorbidities?     Priscille Heidelberg, PharmD  PGY2 Oncology Pharmacy Resident

## 2023-09-07 NOTE — Unmapped (Signed)
Hi,    Patient Patrick Montgomery called requesting a medication refill for the following:    Medication: Oxycodone 5mg   Pharmacy: [id number], [place] - [address]  [address] [place], [place] [address]  Phone: [phone]  Fax: [phone]     Patient only has one day left of medication      The expected turnaround time is 3-4 business days     Thank you,  Yehuda Mao  [facility]  [phone]

## 2023-09-08 DIAGNOSIS — C61 Malignant neoplasm of prostate: Principal | ICD-10-CM

## 2023-09-08 MED ORDER — OXYCODONE 5 MG TABLET
ORAL_TABLET | ORAL | 0 refills | 17 days | Status: CP | PRN
Start: 2023-09-08 — End: ?

## 2023-09-08 NOTE — Unmapped (Signed)
PDMP and chart reviewed; refill request for oxycodone 5 mg IR appropriate. Sent to [facility].

## 2023-09-13 ENCOUNTER — Ambulatory Visit
Admit: 2023-09-13 | Discharge: 2023-09-14 | Payer: PRIVATE HEALTH INSURANCE | Attending: Student in an Organized Health Care Education/Training Program | Primary: Student in an Organized Health Care Education/Training Program

## 2023-09-13 DIAGNOSIS — C44619 Basal cell carcinoma of skin of left upper limb, including shoulder: Principal | ICD-10-CM

## 2023-09-13 DIAGNOSIS — L858 Other specified epidermal thickening: Principal | ICD-10-CM

## 2023-09-13 NOTE — Unmapped (Signed)
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 Sites and procedure verified with patient/physician agreement and clinical photograph.   2 Health Care Workers involved in verification.   Consent form:  Implantable device: no             Allergies to Anesthetics: no  Intolerance to Epinephrine: no       Anticoagulants: Aspirin  Major Medical Problems: Reviewed in Epic   Time: 10:30AM     Personnel present during procedure/verification: Sharlyne Cai, MD Inis Sizer MD PhD  Operating surgeon (s): Sharlyne Cai, MD     The size and nature of the excisions as well as typical scarring were discussed along with, among others, the risks of bleeding, infection, cutaneous dysesthesia, positive margins, and lesion recurrence.  Written consent obtained/form signed.    EXCISIONS  Anesthesia: lidocaine 1% with epinephrine was infiltrated at the sites with a total of 18 ml injected.   The surgical sites were marked, cleansed with chlorhexidine x 2, and draped in a sterile fashion.      Measurements:  L forearm: Tumor diameter: 15 mm   Margins: 4 mm   Total excision (tumor + margins) size:  23 mm  R inferior forearm: Tumor diameter: 10 mm   Margins: 2 mm   Total excision (tumor + margins) size:  14 mm    Sites were excised to the subcutaneous tissue  Undermining was performed, and hemostasis was accomplished with electrocautery and pressure.     CLOSURES    L forearm: Linear layered closure performed to close potential space with:  Buried vertical mattress sutures:   4-0 Vicryl  Superficial suture:   Locked running  4-0 Prolene  Length of closure: 70 mm    R inferior forearm:   Linear layered closure performed to close potential space with:Buried vertical mattress sutures:   4-0 Vicryl  Superficial suture:   Simple interrupted and one horizontal mattress suture   4-0 Prolene  Length of closure: 34 mm    ANTIBIOTICS: None     DRESSING  Dry pressure dressing was applied to the wound sites over petrolatum.   Wound healing expectations and the specifics of wound cleansing and bandaging were discussed in detail. The patient was given a handout reiterating instructions.     Suture removal plan: 2 weeks in clinic    RTC: Return for 2 weeks suture removal visit .

## 2023-09-13 NOTE — Unmapped (Signed)
Post-Operative Instructions  Instructions  Get plenty of rest.  Take pain medication as needed. Start with acetaminophen (Tylenol) and rotate with ibuprofen (Motrin/Advil) as needed. Do not take aspirin or any product containing aspirin.  Ice for 10-15 minutes every hour or more often as needed for pain  Activities  Resume normal activities gradually.  Return to school/work in 1-2 days, depending upon extent of surgery.  ?? Physical activity, including but not limited to the following, should be restricted for four weeks following surgery:  No sports activities including swimming, hot tubs, baths, bicycle, scooter, skateboard or roller blade riding, dance, karate, horseback riding, Frisbee, golf, tennis, bowling, laser tag  No gym at school for four weeks    Incision Care  Keep incisions clean  Keep area clean and dry first 48 hours -- then ok to take off pressure bandage  Wash daily with gentle soap and water  Gently pat the area dry with a clean towel, do not rub  No tub soaking for 2 weeks or until stitches comes out  Keep the stitches clean and greasy with Vaseline twice a day until stitches come out and incision heals      What to Expect  Some bruising and swelling.  May have slight bleeding from incision. Apply 4x4 gauze with slight pressure to control bleeding  Occasionally the wound opens at its center. If this happens, a few weeks of dressing changes may be needed until it heals.    Appearance  Final results of surgery may take a year or more.    Scar Care:  Can begin scar massage at 6 weeks. 15 seconds 3 times a day. Can use ointment of choice such as Mederma.  Avoid exposing scars to sun for at least 12 months  Always use a strong sunblock, if sun exposure is unavoidable (SPF 30 or greater)    When to Call  If swelling and redness persist after a few days.  If you have increased redness along the incision.  If you have severe or increased pain not relieved by medication.  If you have any side effects to medications, such as, rash, nausea, headache, vomiting.  If you have an oral temperature over 100.4 degrees.  If you have any yellowish or greenish drainage from the incisions or notice a foul odor.  If you have bleeding from the incisions that is difficult to control with light pressure    For Medical Questions, Please Call:  Monday-Friday. 8 a.m. - 5 p.m. Office [phone]  After hours and on weekends, call the number above and there will be instruction for calling the hospital to page the Dermatology Resident on call.

## 2023-09-14 ENCOUNTER — Ambulatory Visit
Admit: 2023-09-14 | Discharge: 2023-09-15 | Payer: PRIVATE HEALTH INSURANCE | Attending: Student in an Organized Health Care Education/Training Program | Primary: Student in an Organized Health Care Education/Training Program

## 2023-09-14 DIAGNOSIS — G893 Neoplasm related pain (acute) (chronic): Principal | ICD-10-CM

## 2023-09-14 NOTE — Unmapped (Signed)
OUTPATIENT ONCOLOGY PALLIATIVE CARE CONSULT NOTE    Patrick Montgomery is a 62 y.o. male who is seen in consultation at the request of Patrick Montgomery* for evaluation of Symptoms     Principal Diagnosis: Patrick Montgomery is a 62 y.o. male with prostate cancer, diagnosed in June 2023 now on ADT with Eliguard.  Disease sites include prostate, ?solitary bone metastasis.     Assessment/Plan:     #Cancer-Related Pain: ONgoing, mostly controlled by current regimen. Goal is to get to a pudendal nerve block. Moderate to severe pain in the right perineal area. Unclear if cancer related although this does seem to correlate with CT finding of increased conspicuity of right inferior pubic ramus mixed lytic sclerotic lesion.  Unclear if pain is from this is disease versus radiation changes.  Now describing more of a neuropathic burning sensation with radiation down leg.   - s/p xtampza as patient felt unclear efficacy and then self-discontinued when he ran out  - Refill oxycodone to 5-10 mg q4 hrs PRN --> no more than 7 tablets per day  - s/p gabapentin 600mg  to TID - patient self-discontinued  - Referral to Anesthesia pain - recommending MRI followed by likely pudendal nerve block   - Goal will be to wean off oxycodone pending successful nerve block  - Cont tylenol 1000 mg TID PRN  - Refill flexeril 10mg  nightly PRN  - Cont Lidocaine ointment to tender area of skin    Prior Medications:   - MS contin - didn't find effective.  Oxycodone 5 mg effective.  - Naproxen BID - mild benefit  - Tylenol - mild to no benefit    #Cancer-Related Fatigue: Improved and stable. Still able to be active. Gets out of house daily.   - Encouraged light exercise as able    #Early Satiety: Improved and stable. Now gaining weight.   - If worsens, patinet open to nutrition referral in future  - CTM, possible marinol trial in future if appetite becomes issue    #Constipation: Improved with bowel reimgen.   - cont senna to 2 tablet nightly  - Continue MiraLAX 1-2 times daily   - Low threshold to escalate regimen vs. Start naloxegol    #Skin Lesions: Ongoing, just had two BCCs removed.  Hx of skin cancer. Previously seen by [facility] derm.   - Re-refer to [facility] derm - appt is pending    #Mood/Coping: Stable.  Good support from his wife, mother and children. Stays positive by nature. Says he would be perfect if not for lingering pain.     #Goals of Care/Prognostic Awareness: Previously provided patient with prepare for your care paperwork and introduced the concepts of CODE STATUS and the purpose of advance directives.  Patient appreciated having something to take at home and complete with his partner. Hasn't returned this to clinic yet.   - Remains on board with current plan of care.     From prior convos:  - Knows he has prostate cancer and is followed by Dr. Regino Schultze.  Knows that recent PSA was good.  Understands that he is taking medicine through 2026 for this.  Unsure if his disease is curable or not and what the overall trajectory of his prognosis is, but would like to know.  Is a type of person who would want to know details about his disease and prognosis going forward.  - Patient shares his understanding that he has metastatic prostate cancer. He does not recall much about what  to expect from this cancer, including about prognosis. He knows that there are several treatment options, but is not sure whether it is curable or not.   - We asked whether he is the type of person who wants to know details about prognosis and he reports that he would like to know. He appreciates honest and direct communication. We said that we would discuss with oncology so that we can provide accurate communication.   - Finally, we asked who his preferred HCDM is and he says that it is his domestic partner, Patrick Montgomery. We discussed that she would not be default HCPOA by state law and recommended completing formal HCPOA paperwork to allow her to be his surrogate if needed in the future. #Advance Care Planning:  HCDM:   HCDM (patient stated preference): Patrick Montgomery - Domestic Partner [phone]  Natural surrogate decision maker: majority of adult children and living parents  Advance Directive: none on file - wants to do HCPOA      #Controlled substances risk management.   - Patient has a signed pain medication agreement with Outpatient Palliative Care, completed on 01/28/23, as per standard of care.   - [place] database was reviewed today and it was appropriate.   - Urine drug screen was performed at this visit. Findings: periodic screening has been appropriate.   - Patient has received information about safe storage and administration of medications.   - Patient has received a prescription for narcan and patient is not applicable.     F/u: 6-8 weeks    ----------------------------------------    Referring Provider: Ortencia Kick  Oncology Team: [name]  PCP: Deneise Lever, FNP    Interval Hx, 09/15/23:    No major changes since last visit. 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  They have ordered an MRI for further clarity and will likely pursue pudendal nerve block afterward.    patient is managing the pain with his current opioid regimen.  He has not found gabapentin to be all that helpful so he has self discontinued.    No current dyspnea, nausea, vomiting, diarrhea, or peripheral neuropathy.    He is having some constipation.  He is not having any dysuria or frequency.  Reports a decreased libido while on antiandrogen therapy.     Mood is good overall.  Eating well.    HPI: Patrick Montgomery is a 62 year old male with history of prostate cancer establishing with palliative care today primarily for pain control.    In terms of pain, patient locates the pain just to the right of his rectum, sometimes with radiation down his leg and up his back. He describes the pain as a sharp and excruciating pain. He notes that it is constant. It is worse after moving around all day and worse when sitting down.  It feels better if he massages the area.  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    Palliative Performance Scale: 60% - Ambulation: Reduced / unable to do hobby or some housework, significant disease / Self-Care:Occasional assist as necessary / Intake:Normal or reduced / Level of Conscious: Full or confusion    Social History:   Lives in [place], [place] with his domestic partner, Belenda Cruise (not officially married). They have an 18yo daughter. Also has a 26yo son who runs a comic  book store. Mother is living and also a source of support.    Watches church every Sunday - [employer]. Also belongs to a [employer] church.     Occupation: Retired house painter  Hobbies: Likes to race go-karts and cars. Likes to watch his nephew do this as well.     Objective     Hematology/Oncology History Overview Note   In 03/2022, seen in ER for gross hematuria, worsening LUTS. CT bladder showing bladder mass.  In 04/2022, PSA 18. Had cysto, prostate biopsy, TURP. Prostate biopsy showed Gleason 4+5=9 (GG 5), 10/12 cores positive for cancer. TURP specimen showed mixed acinar/ductal features, cribriborm morphology.  In 05/2022, CT and bone scan showed no pelvic or distant mets  In 06/2022, ADT started with Degarelix, continued with Eligard.  In 06/2022, PSMA PET showed avidity in R inferior pubic ramus, suspicious for single bone met.  In 07/2022, abiraterone/prednisone started.  In 08/2022, RT to prostate/SV/nodes and pubic ramus bone met site, completed in 12/2022.  In 05/2023, abiraterone held due to hypertension and hypokalemia.     Prostate cancer (CMS-HCC)   06/17/2022 Initial Diagnosis    Prostate cancer (CMS-HCC)     06/17/2022 -  Cancer Staged    Staging form: Prostate, AJCC 8th Edition  - Clinical: Stage IIIC (cT4, cN0, cM0, PSA: 18, Grade Group: 5) - Signed by Young E Whang, MD on 06/19/2022       07/07/2022 - 07/07/2022 Endocrine/Hormone Therapy    OP DEGARELIX  Plan Provider: Young E Whang, MD     08/11/2022 Endocrine/Hormone Therapy    OP PROSTATE LEUPROLIDE (ELIGARD) 45 MG EVERY 6 MONTHS  Plan Provider: Young E Whang, MD     10 /10/2022 -  Radiation    Radiation Therapy Treatment Details (Noted on 09/10/2022)  Site: Prostate  Technique: IMRT  Goal: No goal specified  Planned Treatment Start Date: No planned start date specified       REVIEW OF SYSTEMS:  A comprehensive review of 10 systems was negative except for pertinent positives noted in HPI.    PHYSICAL EXAM:   GEN: NAD  CV: no evidence of cyanosis  PULM: No increased work of breathing  ABD: soft, ND  MSK: mild sarcopenia  EXT: No LE edema  NEURO: AO x 3, No gross focal deficits,  no myoclonus  PSYCH: mood and affect appropriate, no agitation            I personally spent 40 minutes face-to-face and non-face-to-face in the care of this patient, which includes all pre, intra, and post visit time on the date of service.  All documented time was specific to the E/M visit and does not include any procedures that may have been performed.        Redgie Grayer, MD, MEd  [facility] Outpatient Oncology Palliative Care

## 2023-09-15 ENCOUNTER — Ambulatory Visit
Admit: 2023-09-15 | Discharge: 2023-09-16 | Payer: PRIVATE HEALTH INSURANCE | Attending: Pharmacist Clinician (PhC)/ Clinical Pharmacy Specialist | Primary: Pharmacist Clinician (PhC)/ Clinical Pharmacy Specialist

## 2023-09-15 ENCOUNTER — Other Ambulatory Visit: Admit: 2023-09-15 | Discharge: 2023-09-16 | Payer: PRIVATE HEALTH INSURANCE

## 2023-09-15 ENCOUNTER — Ambulatory Visit: Admit: 2023-09-15 | Discharge: 2023-09-16 | Payer: PRIVATE HEALTH INSURANCE

## 2023-09-15 DIAGNOSIS — C61 Malignant neoplasm of prostate: Principal | ICD-10-CM

## 2023-09-15 LAB — HEPATIC FUNCTION PANEL
ALBUMIN: 3.6 g/dL (ref 3.4–5.0)
ALKALINE PHOSPHATASE: 83 U/L (ref 46–116)
ALT (SGPT): 14 U/L (ref 10–49)
AST (SGOT): 19 U/L (ref <=34)
BILIRUBIN DIRECT: 0.2 mg/dL (ref 0.00–0.30)
BILIRUBIN TOTAL: 0.4 mg/dL (ref 0.3–1.2)
PROTEIN TOTAL: 6.3 g/dL (ref 5.7–8.2)

## 2023-09-15 LAB — PSA: PROSTATE SPECIFIC ANTIGEN: <0.04 ng/mL (ref 0.00–4.00)

## 2023-09-15 LAB — POTASSIUM: POTASSIUM: 3.5 mmol/L (ref 3.5–5.1)

## 2023-09-15 NOTE — Unmapped (Incomplete)
[facility] Visit Note    Patient Name: Patrick Montgomery  Patient Age: 62 y.o.  Encounter Date: 09/15/2023  Attending Provider:  Young E. 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    Patrick Montgomery is a 63 y.o. male who presents for management of newly diagnosed prostate cancer. He was initially seen in the ER In May 2023 for gross hematuria and  worsening LUTS. A CT scan of the bladder revealed a bladder mass. In June 2023, he was found to have a PSA of 18. He underwent cystoscopy, prostate biopsy, and TURP. Prostate biopsy showed Gleason 4+5=9 (GG 5), 10/12 cores positive for cancer. TURP specimen showed mixed acinar/ductal features, cribriborm morphology. In July 2023, CT and bone scan showed no pelvic or distant metastases.    On 06/18/22, we discussed management of his recent diagnosis of prostate cancer. Given that he has been diagnosed with prostate cancer at a very young age, I informed the patient that there may be features that indicate a higher risk of hereditary prostate cancer. 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   Based on current imaging, his cancer is confined to the prostate. A PSMA PET scan will be ordered to see if the cancer has metastasized.     On 07/07/2022, pt is now ready to move ahead with treatment. Will proceed with ADT plus abiraterone for two years, per STAMP[facility].  PSMA PET still undergoing authorization.    On 08/11/2022, most recent PSA on 08/01/2022 is 0.27. PSMA PET scan from 07/27/2022 showed avidity involving the right inferior pubic ramus which is suspicious for single site of osseous metastatic disease. Will proceed with Eligard shot and proceed with radiation with Dr. Carles Collet. May consider radiating single site of metastatic disease at the right inferior pubic ramus     On 12/03/2022, PSA <0.04, on ADT plus abi, s/p RT to all involved sites. For pain, unlikely to be from cancer, may benefit from pain clinic management long-term.    On 02/16/23, PSA remains <0.04. Received ADT today and will continue abiraterone/prednisone. BP mildly elevated at 146/81, will monitor. Chronic pain being addressed with support from palliative care team. Will RTC in 3 months.    On 05/18/2023, PSA remains undetectable. BP elevated at 166/72, potassium 3.2 today. Hold abiraterone for now.     On 08/18/23, PSA remains undetectable. His BP improved from last visit at 151/74, K 4.4 and still remains on potassium supplement. He remains off of abiraterone. Has not checked BP at home. He is due for Eligard today. Given elevated BP, will increase amlodipine to 10 mg for BP management and restart abiraterone and continue prednisone.    Today, on 09/15/23, PSA remains undetectable. BP 150/75. K 3.5 and still remains on potassium supplement. LFTs WNL. Taking abiraterone and prednisone. Taking amlodipine 10mg , home BP averages 120s/80s.  Due for Eligard 02/15/2024.     Plan:  Continue ADT with Eligard, next due on 02/15/24. Anticipate 3 years of therapy (till 8-07/2025)  --Follow PSA  Continue Abiraterone 1000 mg daily and continue prednisone 5 mg daily x 2 years (Until ~07/2024)  Potassium supplement continued  Follow labs/BP  Instructed patient to bring log of BP readings at home at next follow up  3. NGS returned without actionable mutations, germline testing negative on [facility] normal  4. RTC 11/16/23 to see Dr Tomma Rakers PharmD candidate 2025    Laverna Peace PharmD, BCOP, CPP  Hematology/Oncology Pharmacist  P: [phone]      Reason for Visit  Follow up of prostate cancer.     History of Present Illness:  Hematology/Oncology History Overview Note   In 03/2022, seen in ER for gross hematuria, worsening LUTS. CT bladder showing bladder mass.  In 04/2022, PSA 18. Had cysto, prostate biopsy, TURP. Prostate biopsy showed Gleason 4+5=9 (GG 5), 10/12 cores positive for cancer. TURP specimen showed mixed acinar/ductal features, cribriborm morphology.  In 05/2022, CT and bone scan showed no pelvic or distant mets  In 06/2022, ADT started with Degarelix, continued with Eligard.  In 06/2022, PSMA PET showed avidity in R inferior pubic ramus, suspicious for single bone met.  In 07/2022, abiraterone/prednisone started.  In 08/2022, RT to prostate/SV/nodes and pubic ramus bone met site, completed in 12/2022.  In 05/2023, abiraterone held due to hypertension and hypokalemia.     Prostate cancer (CMS-HCC)   06/17/2022 Initial Diagnosis    Prostate cancer (CMS-HCC)     06/17/2022 -  Cancer Staged    Staging form: Prostate, AJCC 8th Edition  - Clinical: Stage IIIC (cT4, cN0, cM0, PSA: 18, Grade Group: 5) - Signed by Maurie Boettcher, MD on 06/19/2022       07/07/2022 - 07/07/2022 Endocrine/Hormone Therapy    OP DEGARELIX  Plan Provider: Maurie Boettcher, MD     08/11/2022 Endocrine/Hormone Therapy    OP PROSTATE LEUPROLIDE (ELIGARD) 45 MG EVERY 6 MONTHS  Plan Provider: Maurie Boettcher, MD     09/10/2022 -  Radiation    Radiation Therapy Treatment Details (Noted on 09/10/2022)  Site: Prostate  Technique: IMRT  Goal: No goal specified  Planned Treatment Start Date: No planned start date specified         Interval history  Today, Patrick Montgomery presents to clinic. He reports feeling well. He takes his abiraterone in the morning and waits a couple hours to eat breakfast. He takes the prednisone in the morning as well with his amlodipine. He has been taking the potassium and says he ran out of this yesterday. Reports tolerating medication well, except for occasional hot flashes. He says that they happen once every 3 days and only last a few minutes. He checks his BP once every few days, says the values are normally in the 120s/80s. Normally takes oxycodone 5 mg 2 tablets in the morning and sometimes 2 tablets in the afternoon if he still has pain. He denies any issues with BM. Says that he's noticed some appetite suppression with the oxycodone but he tries to snack more during the day to compensate for smaller meal sizes. He has a MRI today to better assess why he's having pain and is hopeful he'll have answers soon.      Allergies:  No Known Allergies    Current Medications:    Current Outpatient Medications:  abiraterone (ZYTIGA) 250 mg tablet, Take 4 tablets (1,000 mg total) by mouth daily., Disp: 120 tablet, Rfl: 11    amlodipine (NORVASC) 10 MG tablet, Take 1 tablet (10 mg total) by mouth daily., Disp: 30 tablet, Rfl: 11    aspirin (ECOTRIN) 81 MG tablet, Take 1 tablet (81 mg total) by mouth daily., Disp: 90 tablet, Rfl: 3    atorvastatin (LIPITOR) 10 MG tablet, Take 1 tablet (10 mg total) by mouth daily., Disp: 90 tablet, Rfl: 0    gabapentin (NEURONTIN) 300 MG capsule, Take 2 capsules (600 mg total) by mouth Three (3) times a day., Disp: 180 capsule, Rfl: 3    naloxone (NARCAN) 4 mg nasal spray, One spray in either nostril once for known/suspected opioid overdose. May repeat every 2-3 minutes in alternating nostril til EMS arrives, Disp: 2 each, Rfl: 3    oxyCODONE (ROXICODONE) 5 MG immediate release tablet, Take 1-2 tablets (5-10 mg total) by mouth every four (4) hours as needed for pain. No more than 7 tablets per day., Disp: 196 tablet, Rfl: 0    polyethylene glycol (MIRALAX) 17 gram packet, Take 17 g by mouth two (2) times a day., Disp: 100 packet, Rfl: 2    potassium chloride (KLOR-CON) 10 MEQ CR tablet, Take 1 tablet (10 mEq total) by mouth daily., Disp: 30 tablet, Rfl: 3    predniSONE (DELTASONE) 5 MG tablet, Take 1 tablet (5 mg total) by mouth daily., Disp: 30 tablet, Rfl: 11    SENNA 8.6 mg tablet, Take 2 tablets by mouth nightly., Disp: 180 tablet, Rfl: 3    Past Medical History and Social History  Past Medical History:   Diagnosis Date    Anxiety     Arthritis     Basal cell carcinoma     Cancer (CMS-HCC)     skin    Gout     Kidney stones     Squamous cell skin cancer     Stroke (CMS-HCC)       Past Surgical History:   Procedure Laterality Date    PR BIOPSY OF PROSTATE,NEEDLE/PUNCH N/A 05/21/2022    Procedure: IMAGE GUIDED BIOPSY, PROSTATE; NEEDLE OR PUNCH, SINGLE OR MULTIPLE, ANY APPROACH;  Surgeon: Phillips Grout, MD;  Location: [facility];  Service: Urology    PR TRANSURETHRAL ELEC-SURG PROSTATECTOM N/A 05/21/2022    Procedure: TRANSURETHRAL ELECTROSURGICAL RESECT PROSTATE, W/CONTORL POSTOP BLEED, COMPLETE (VASECT, MEATOT, CYSTO ETC);  Surgeon: Phillips Grout, MD;  Location: [facility] OR [facility];  Service: Urology    SKIN BIOPSY      skin cancer removal           Social History     Occupational History    Not on file   Tobacco Use    Smoking status: Every Day     Current packs/day: 1.50     Average packs/day: 1.5 packs/day for 45.0 years (67.5 ttl pk-yrs)     Types: Cigarettes    Smokeless tobacco: Never   Vaping Use    Vaping status: Never Used   Substance and Sexual Activity    Alcohol use: No     Comment: socially     Drug use: Yes     Types: Marijuana    Sexual activity: Not Currently     Partners: Female     Birth control/protection: None       Family History  Family History   Problem Relation Age of Onset    Cancer Father  Skin    Melanoma Father     Squamous cell carcinoma Father     Rashes / Skin problems Father     Cancer Brother         skin     Substance Abuse Disorder Neg Hx     Basal cell carcinoma Neg Hx      Prostate Cancer Family History Assessment:  History of cancer in children (yes/no; if yes, what type AND age of diagnosis): No  Total number of siblings (including deceased): 2  History of cancer in siblings (yes/no; if yes, provide relation, type of cancer, AND age of diagnosis): Brother superficial basal cell; age unknown  History of cancer in parents (yes/no; if yes, please specify parent, type of cancer, AND age of diagnosis): Father superficial basal cell carcinoma; age unknown  History of cancer in aunts/uncles/grandparents (yes/no; if yes, provide relation, type of cancer, AND age of diagnosis): No      Review of Systems:  A comprehensive review of 10 systems was negative except for pertinent positives noted in HPI.    Physical Exam:    VITAL SIGNS:  There were no vitals taken for this visit.  ECOG Performance Status: 1  GENERAL: Well-developed, well-nourished patient in no acute distress.  HEAD: Normocephalic and atraumatic.  EYES: Conjunctivae are normal. No scleral icterus.  MOUTH/THROAT: Oropharynx is clear and moist.  No mucosal lesions.  NECK: Supple, no thyromegaly.  LYMPHATICS: No palpable cervical, supraclavicular, or axillary adenopathy.  CARDIOVASCULAR: Normal rate, regular rhythm and normal heart sounds.  Exam reveals no gallop and no friction rub.  No murmur heard.  PULMONARY/CHEST: Effort normal and breath sounds normal. No respiratory distress.  ABDOMINAL:  Soft. There is no distension. There is no tenderness. There is no rebound and no guarding.  MUSCULOSKELETAL: No clubbing, cyanosis, or lower extremity edema.  PSYCHIATRIC: Alert and oriented.  Normal mood and affect.  NEUROLOGIC: No focal motor deficit. Normal gait.  SKIN: Skin is warm, dry, and intact.      Results/Orders:    No visits with results within 2 Week(s) from this visit.   Latest known visit with results is:   Lab on 08/18/2023   Component Date Value Ref Range Status    PSA 08/18/2023 <0.04  0.00 - 4.00 ng/mL Final    Albumin 08/18/2023 3.6  3.4 - 5.0 g/dL Final    Total Protein 08/18/2023 6.5  5.7 - 8.2 g/dL Final    Total Bilirubin 08/18/2023 0.3  0.3 - 1.2 mg/dL Final Bilirubin, Direct 08/18/2023 0.10  0.00 - 0.30 mg/dL Final    AST 16/08/9603 16  <=34 U/L Final    ALT 08/18/2023 11  10 - 49 U/L Final    Alkaline Phosphatase 08/18/2023 84  46 - 116 U/L Final    Potassium 08/18/2023 4.4  3.5 - 5.1 mmol/L Final       Lab Results   Component Value Date    PSA <0.04 08/18/2023    PSA <0.04 05/18/2023    PSA <0.04 02/16/2023    PSA <0.04 01/07/2023    PSA <0.04 12/03/2022    PSA <0.04 10/30/2022    PSA <0.04 10/16/2022    PSA 0.09 10/05/2022    PSA 0.04 09/21/2022    PSA 0.09 09/07/2022         Orders placed or performed in visit on 09/15/23    LAB Appointment Request       Molecular Pathology  Tumor mutation profiling  [facility]: ZMYM3 mutation,  TMB 3.7, MSS, no actionable mutations    Germline testing  Negative on [facility] normal draw    Pathology:  A: Prostate, right apex, core biopsy  - Prostatic adenocarcinoma, Gleason score 4 + 4 = 8 (Grade group 4) involving 2 of 2 cores, approximately 4 and 1 mm in linear extent, approximately 20% of total core length.      B: Prostate, left apex, core biopsy  - Prostatic adenocarcinoma, Gleason score 4 + 4 = 8 (Grade group 4) involving 2 of 2 cores, approximately 7 and 5 mm in linear extent, approximately 60% of total core length.   - Perineural invasion identified in this case.      C: Prostate, left mid, core biopsy  - Prostatic adenocarcinoma, Gleason score 4 + 5 = 9 (Grade group 5) involving 2 of 2 cores, approximately 6 and 3mm in linear extent, approximately 90% of total core length.      D: Prostate, right mid, core biopsy  - Prostatic adenocarcinoma, Gleason score 4 + 5 = 9 (Grade group 5) involving 2 of 2 cores, approximately 6 and 5 mm in linear extent, approximately 70% of total core length.      E: Prostate, right base, core biopsy  - Benign prostate tissue.      F: Prostate, left base, core biopsy  - Prostatic adenocarcinoma, Gleason score 4 + 4 = 8 (Grade group 4)  involving 2 of 2 cores, approximately 3 and 1 mm in linear extent, approximately 20% of total core length.      G: Bladder neck and bilateral median lobe, transurethral resection  - Involved by prostatic adenocarcinoma with mixed acinar and ductal features, Gleason score 4 + 4 = 8 (Grade group 4)    - Lymphovascular invasion identified     The pattern 4 in this case demonstrates cribriform morphology.      Imaging results:    CT Chest w Contrast 06/11/22:   No intrathoracic metastasis.       CT Abdomen Pelvis W Contrast 06/11/22:     Enlarged irregular prostate correlates with history of biopsy-proven prostate cancer. There is redemonstrated irregularity of the posterior bladder and increased irregularity of the prostate at the bladder margin compared to prior may be secondary to tumor versus postsurgical/post TURP change. Consider attention on follow-up versus further evaluation with PSMA PET scan.       Questionable sclerotic changes of the right inferior pubic ramus are indeterminate and may represent normal variation although metastatic osseous disease is not excluded. Attention on follow-up bone scan. There is no other evidence of metastatic disease in the abdomen/pelvis.       Additional chronic and incidental findings as described.       Please see same day CT chest report for detailed findings above the diaphragm     NM Bone Scan Whole Body 06/11/22  No findings suggestive of osseous metastatic disease     PET CT 07/27/2022:   Impression   1.Ill-defined tracer avidity associated with the prostate, left greater than right, likely representing biopsy-proven prostate carcinoma.   2.Moderate avidity involving the right inferior pubic ramus which is suspicious for single site of osseous metastatic disease. Otherwise, no definite evidence of additional metastatic disease. More specifically, no avid adenopathy. Subcentimeter pulmonary nodules which are below the resolution of PET/CT and require continued attention on follow-up.

## 2023-09-16 NOTE — Unmapped (Signed)
Result Note: Patient contacted and informed that margins appear clear for both A and B, skin cancers both likely fully excised; no additional treatment is needed for A or B.     Tracking: No.  Treatment plan:??No additional treatment needed  [id number] visit  Patient notified: MYCHART    Result: Diagnosis       Date                     Value               Ref Range           Status                09/13/2023                                                       Final             A. Left forearm, excision:   Basal cell carcinoma, nodular and infiltrative patterns with associated    healed surgical wound, margins are free.   B:  Right forearm, excision:   - Previous biopsy site changes with no residual keratoacanthoma, margins    are free.   - Basal cell carcinoma, nodular and superficial patterns, margins are    free. [FORMATTING REMOVED]  ----------

## 2023-09-22 ENCOUNTER — Ambulatory Visit
Admit: 2023-09-22 | Discharge: 2023-09-23 | Payer: PRIVATE HEALTH INSURANCE | Attending: Student in an Organized Health Care Education/Training Program | Primary: Student in an Organized Health Care Education/Training Program

## 2023-09-22 DIAGNOSIS — G9782 Other postprocedural complications and disorders of nervous system: Principal | ICD-10-CM

## 2023-09-22 DIAGNOSIS — G588 Other specified mononeuropathies: Principal | ICD-10-CM

## 2023-09-22 DIAGNOSIS — Y842 Radiological procedure and radiotherapy as the cause of abnormal reaction of the patient, or of later complication, without mention of misadventure at the time of the procedure: Principal | ICD-10-CM

## 2023-09-22 NOTE — Unmapped (Signed)
Chronic Pain Follow Up Note    Assessment and Plan    1. 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 male with past medical history who is being followed at [facility] for cancer related rectal and right lower extremity pain.     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      Last PET scan was 07/27/2022 showing uptake in the right inferior pubic ramus, this was treated previously with XRT.  Most recent CT scan was 12/15/2022 showing increased conspicuity of right inferior pubic ramus mixed lytic sclerotic lesion.    He previously received radiation therapy as part of his cancer care. We obtained an MRI of the pelvis 09/15/23 seeking to evaluate the pudendal and perineal nerves. Although these nerves were not well-visualized, the radiologist did note post-radiation changes in the expected areas of these nerves, with no evidence of new areas of cancer or mechanical compression. Most likely culprit is thought to be radiation injury of the pudendal nerve. The pudendal nerve branches to the inferior rectal nerve, which communicates with the posterior femoral cutaneous nerve which could explain the posterior thigh pain. We discussed options for pain management with medications targeting neuropathic pain (duloxetine and pregabalin) vs pudendal nerve block w/steroid. At the moment, pt would prefer medications alone and does not want to try a nerve block yet. Currently manages pain with oxycodone 5 mg 7x per day to moderate effect.    Medications are currently being managed by Dr. Ike Bene. We will provide recommendations and defer prescribing to Dr. Ike Bene to avoid multiple clinicians managing medications.    Patient will follow-up with [name] [facility] if medications are ineffective and patient wants to move forward with a right pudendal nerve block with steroid.      Plan:  -Continue pain medications from other clinic  -Patient no longer taking gabapentin (self-discontinued 1 month ago)  -Defer to Dr. Ike Bene for medication management; consider initiating Duloxetine or Lyrica for neuropathic pain  -Discussed right pudendal nerve block with patient, patient preferred conservative/medication management at this time, will follow up with our clinic if wanting to move forwards with scheduling a pudendal nerve block     Future considerations:  -Pudendal nerve block, pt will call to schedule if interested.    Return if symptoms worsen or fail to improve or if you want to schedule a nerve block.    Requested Prescriptions      No prescriptions requested or ordered in this encounter     No orders of the defined types were placed in this encounter.    HPI  Patrick Montgomery is a 62 y.o. being followed at [facility] for complaint of chronic pain localized to rectal and RLE.     At initial visit in September patient noted that he had been having pain for about a year to the right of his rectum and then radiated down the back of his thigh and down to his knee.  He also described a burning sensation in the right hemiscrotum.  The right lower extremity pain did not extend below the knee.  He described the pain as constant and noted that the pain was the worst when he was sitting or applied  pressure to his gluteal region. He described the pain as sharp and noted that it had gotten worse over the past few months.     Today, patient notes that his pain is in the same location, has not changed. Denies bowel/bladder retention or incontinence. He denies changes in feeling of leg or loss of strength. denied any lower extremity numbness or weakness.     In terms of medications he is currently taking aspirin. He notes that gabapentin provided no benefit and has stopped taking it around a month ago (he was taking 600 mg TID). He notes that the oxycodone provides benefit. He denies taking duloxetine or Cymbalta and has not tried TCAs before.    Current analgesic regimen:  Oxycodone 5 to 10 mg 7x per day     Current view: Showing all answers           [facility] Return Patient Questionnaire       Question 09/22/2023  6:39 AM EDT - [name] by Patient    What is the reason for your visit? Review test results    Date of onset of your pain: 11/02/2022    Please rate your pain at its WORST in the past month. 9    Please rate your pain at its LEAST in the past month. 7    Please rate your pain as it is RIGHT NOW. 8    Please rate your pain on AVERAGE in the past month. 9    Please circle the location of your pain.       Please select the words that describe your pain. Aching     Pulsing     Sharp     shooting     Stabbing Tender Throbbing    How often do you have pain? All the time    When is your pain the worst? Evenings    Which of the following have been negatively affected by your pain? Enjoyment of life     General activity     Recreational activities     Relationships with people     Sleep     Walking     Standing    Since your last visit:     Have you had any of the following?       Do you have any new pain you would like to discuss with your doctor? No    How has your pain changed? Stayed the same    Are you currently taking any blood-thinners or anticoagulants? No    If you are on Pain Medication - Are you having any of the following? None    If you have had a procedure since your last visit, how much pain relief was obtained?       If you have had a procedure since your last visit, were there any complications?       General: Weight Loss/Gain     Difficulty Sleeping    Cardiovascular:       Gastrointestinal - (Intestinal):       Skin:       Endocrine (Hormonal System): Decreased sex drive     Difficulties with Orgasm (Delayed or None)    Musculoskeletal System - (Muscles, Joints and Coverings): Back pain    Neurologic:       Psychiatric: Low Energy     Lack of interest in activities          [facility] Patient-Entered [facility]pi Selection Questionnaire  Question 09/22/2023  6:43 AM EDT - [name] by Patient    What is the primary reason for your visit? Back Pain    Your back pain Has lasted for 3 months or more    When did you first notice your back pain? More than 1 year ago    How often do you feel back pain? Constantly    Since you first noticed your back pain, how has it changed? Unchanged    Where is your back pain located? Buttocks     Lower back - below the waist    How would you describe your back pain? Aching     Shooting     Stabbing    Where does your back pain spread? Right thigh    On a scale of 0 to 10 (10 being the worst), how strong is your back pain? 9    Your back pain is... the same all the time    What makes your back pain worse? Bending     Sitting     Standing    When do you feel stiffness in your back? All day    Are you experiencing any of the following symptoms with your back pain?     Abdominal pain       Bladder incontinence       Bowel incontinence       Chest pain       Fever       Headaches       Leg pain       Numbness       Numbness around the anus       Painful urination       Paralysis       Pelvic pain       Pins and needles       Tingling       Weakness       Weight loss       Do any of the following apply to you? History of cancer          Previous Medication Trials:  NSAIDS- none tried  Antidepressants- none tried  Neuroleptics- gabapentin  Muscle relaxants- none tried  Topicals- None  Short-acting opiates- oxycodone  Long-acting opiates- None  Anxiolytics- none    Previous Interventions  Procedures: none    Imaging:    MRI Pelvis 09/15/23    FINDINGS  Increased T2 signal along the anterior aspect of the right inferior pubic ramus consistent with previously identified metastatic disease. No other marrow signal abnormalities are noted.      There is extensive edema of the right-sided obturator internus, obturator externus, pectineus, and left-sided obturator internus muscle bodies in a cone-shaped distribution consistent with prior radiotherapy. The other muscle bodies of the pelvis and visualized legs are normal and symmetric. Mild left-sided gluteus medius tendinopathy. No evidence of bursitis.      The sacroiliac and hip joints are normal.      The sciatic nerves are normal. The right-sided pudendal and perineal nerve distributions are not well visualized on T2 imaging or neurography due to the diminutive size of the nerves, however the post radiation changes of the muscles as described above involve the normal anterior course of these nerves. Vasculature of the pelvis is normal.      Diffusely increased T2 signal of the prostate which measures approximately 3.2 cm in greatest axial dimension. This is consistent with post radiation changes. No evidence of discrete mass,  though evaluation is limited on noncontrast exam.       IMPRESSION  Poor visualization of the  pudendal and perineal nerves bilaterally.      There is however, extensive post radiation changes of the right-sided adductor and levator ani musculature surrounding the treated lesion within the right inferior pubic rami. These changes correspond to the expected location of anterior distribution of the above-mentioned nerves which indicate the possibility of of its involvement by post radiation damage or mass effect plus or minus scarring as a possible etiology of the patient's symptoms.     CT Abdomen pelvis 12/15/22  FINDINGS  LOWER CHEST: Unremarkable.       LIVER: Ill-defined hypodensities in the right hepatic lobe with surrounding nodular enhancement consistent with prior and not FDG avid on most recent PET/CT suggestive of hemangioma (i.e. 2:21, 36). No suspicious hepatic lesions. Normal liver contour.       BILIARY: The gallbladder is normal in appearance. No biliary ductal dilatation. SPLEEN: Normal in size and contour.       PANCREAS: Normal pancreatic contour.  No focal lesions.  No ductal dilation.       ADRENAL GLANDS: Normal appearance of the adrenal glands.       KIDNEYS/URETERS: Symmetric renal enhancement. Nonobstructive calculi in the left lower pole measuring 4 mm (2:48) and left mid kidney measuring 3 mm (2:43). No hydronephrosis. No solid renal mass.       BLADDER: Unremarkable.       REPRODUCTIVE ORGANS: Sequela of TURP with dense calcifications throughout the prostate.       GI TRACT: Mild rectosigmoid wall thickening without surrounding inflammatory changes. Sigmoid diverticulosis. Moderate colonic stool with fecalization of distal small bowel contents suggestive of slow colonic transit. Normal appendix.       PERITONEUM, RETROPERITONEUM AND MESENTERY: No free air.  No ascites.  No fluid collection.       LYMPH NODES: No adenopathy.       VESSELS: Hepatic and portal veins are patent.  Normal caliber aorta with extensive calcified atherosclerosis throughout the aorta and common iliac arteries.       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 Moderate degenerative changes of the spine and hips. No focal soft tissue lesions.         IMPRESSION  --Mild rectosigmoid wall thickening without surrounding inflammatory changes, favored secondary to radiation changes, with additional imaging findings suggestive of slow bowel transit.       --Increased conspicuity of right inferior pubic ramus mixed lytic sclerotic lesion which may reflect posttreatment changes versus worsening site of disease. No new osseous metastases.         Allergies  No Known Allergies    Home Medications    Current Outpatient Medications   Medication Sig Dispense Refill    abiraterone (ZYTIGA) 250 mg tablet Take 4 tablets (1,000 mg total) by mouth daily. 120 tablet 11 amlodipine (NORVASC) 10 MG tablet Take 1 tablet (10 mg total) by mouth daily. 30 tablet 11    aspirin (ECOTRIN) 81 MG tablet Take 1 tablet (81 mg total) by mouth daily. 90 tablet 3    atorvastatin (LIPITOR) 10 MG tablet Take 1 tablet (10 mg total) by mouth daily. 90 tablet 0    gabapentin (NEURONTIN) 300 MG capsule Take 2 capsules (600 mg total) by mouth Three (3) times a day. 180  capsule 3    naloxone (NARCAN) 4 mg nasal spray One spray in either nostril once for known/suspected opioid overdose. May repeat every 2-3 minutes in alternating nostril til EMS arrives 2 each 3    oxyCODONE (ROXICODONE) 5 MG immediate release tablet Take 1-2 tablets (5-10 mg total) by mouth every four (4) hours as needed for pain. No more than 7 tablets per day. 196 tablet 0    potassium chloride (KLOR-CON) 10 MEQ CR tablet Take 1 tablet (10 mEq total) by mouth daily. 30 tablet 3    predniSONE (DELTASONE) 5 MG tablet Take 1 tablet (5 mg total) by mouth daily. 30 tablet 11     No current facility-administered medications for this visit.       ROS  See questionnaire above     Answers submitted by the patient for this visit:  Back Pain Questionnaire (Submitted on 09/22/2023)  Chief Complaint: Back pain  Chronicity: chronic  Onset: more than 1 year ago  Frequency: constantly  Progression since onset: unchanged  Pain location: gluteal, sacro-iliac  Pain quality: aching, shooting, stabbing  Radiates to: right thigh  Pain - numeric: 9/10  Pain is: the same all the time  Aggravated by: bending, sitting, standing  Stiffness is present: all day  Risk factors: history of cancer      Physical Exam    VITALS:   Vitals:    09/22/23 0942   BP: 115/74   Pulse: 70   Resp: 16   Temp: 36.2 ??C (97.2 ??F)   SpO2: 96%     Wt Readings from Last 3 Encounters:   09/22/23 63.1 kg (139 lb 3.2 oz)   09/15/23 64.3 kg (141 lb 12.8 oz)   09/14/23 64.5 kg (142 lb 1.6 oz)       GENERAL:  The patient is well developed, well-nourished and appears to be in No distress. The patient is pleasant and interactive. Patient is a good historian.  HEAD/NECK:    Reveals normocephalic/atraumatic. Clear sclera. Mask in place.  LUNGS:   Normal work of breathing, no supplemental O2.  EXTREMITIES:  No clubbing, cyanosis noted. 2+ R radial pulse.  NEUROLOGIC:    The patient was alert and oriented, speech fluent, normal language.  MUSCULOSKELETAL:    Motor function  5/5 in lower extremities with normal tone and bulk. Good range of motion of all extremities. The patient was able to ambulate without difficult throughout the clinic today without the assistance of a walking aid.   SKIN:   No obvious rashes, lesions, or erythema.  PSY:  Appropriate affect    Documentation assistance was provided by Toni Amend, on September 22, 2023 at 10:19 AM for Dr. Cloyde Reams, MD.    September 22, 2023 10:43 AM. Documentation assistance provided by the Scribe. I was present during the time the encounter was recorded. The information recorded by the Scribe was done at my direction and has been reviewed and validated by me.

## 2023-09-22 NOTE — Unmapped (Addendum)
Your MRI was concerning for radiation-associated damage to the nerves that supply the area where you are having your pain (the pudendal nerve and the perineal nerve).    We would defer to Dr. Ike Bene to adjust your medications. There are some other medications that it sounds like you have not tried that may help (duloxetine/Cymbalta and pregabalin/Lyrica). We will message Dr. Ike Bene about these new medications.    We discussed a nerve block to the nerves that may be causing your pain. If you'd want to move forward with trying the nerve block, please call our clinic to set up the injection.

## 2023-09-24 ENCOUNTER — Ambulatory Visit
Admit: 2023-09-24 | Payer: PRIVATE HEALTH INSURANCE | Attending: Student in an Organized Health Care Education/Training Program | Primary: Student in an Organized Health Care Education/Training Program

## 2023-09-24 NOTE — Unmapped (Unsigned)
 OUTPATIENT ONCOLOGY PALLIATIVE CARE CONSULT NOTE    Patrick Montgomery is a 62 y.o. male who is seen in consultation at the request of Patrick Montgomery* for evaluation of Symptoms     Principal Diagnosis: Patrick Montgomery is a 62 y.o. male with prostate cancer, diagnosed in June 2023 now on ADT with Eliguard.  Disease sites include prostate, ?solitary bone metastasis.     Assessment/Plan:     #Cancer-Related Pain: ONgoing, mostly controlled by current regimen. Goal is to get to a pudendal nerve block. Moderate to severe pain in the right perineal area. Unclear if cancer related although this does seem to correlate with CT finding of increased conspicuity of right inferior pubic ramus mixed lytic sclerotic lesion.  Unclear if pain is from this is disease versus radiation changes.  Now describing more of a neuropathic burning sensation with radiation down leg.   - s/p xtampza as patient felt unclear efficacy and then self-discontinued when he ran out  - Refill oxycodone to 5-10 mg q4 hrs PRN --> no more than 7 tablets per day  - s/p gabapentin 600mg  to TID - patient self-discontinued  - Referral to Anesthesia pain - recommending MRI followed by likely pudendal nerve block   - Goal will be to wean off oxycodone pending successful nerve block  - Cont tylenol 1000 mg TID PRN  - Refill flexeril 10mg  nightly PRN  - Cont Lidocaine ointment to tender area of skin    Prior Medications:   - MS contin - didn't find effective.  Oxycodone 5 mg effective.  - Naproxen BID - mild benefit  - Tylenol - mild to no benefit    #Cancer-Related Fatigue: Improved and stable. Still able to be active. Gets out of house daily.   - Encouraged light exercise as able    #Early Satiety: Improved and stable. Now gaining weight.   - If worsens, patinet open to nutrition referral in future  - CTM, possible marinol trial in future if appetite becomes issue    #Constipation: Improved with bowel reimgen.   - cont senna to 2 tablet nightly  - Continue MiraLAX 1-2 times daily   - Low threshold to escalate regimen vs. Start naloxegol    #Skin Lesions: Ongoing, just had two BCCs removed.  Hx of skin cancer. Previously seen by [facility] derm.   - Re-refer to [facility] derm - appt is pending    #Mood/Coping: Stable.  Good support from his wife, mother and children. Stays positive by nature. Says he would be perfect if not for lingering pain.     #Goals of Care/Prognostic Awareness: Previously provided patient with prepare for your care paperwork and introduced the concepts of CODE STATUS and the purpose of advance directives.  Patient appreciated having something to take at home and complete with his partner. Hasn't returned this to clinic yet.   - Remains on board with current plan of care.     From prior convos:  - Knows he has prostate cancer and is followed by Dr. Regino Montgomery.  Knows that recent PSA was good.  Understands that he is taking medicine through 2026 for this.  Unsure if his disease is curable or not and what the overall trajectory of his prognosis is, but would like to know.  Is a type of person who would want to know details about his disease and prognosis going forward.  - Patient shares his understanding that he has metastatic prostate cancer. He does not recall much about what  to expect from this cancer, including about prognosis. He knows that there are several treatment options, but is not sure whether it is curable or not.   - We asked whether he is the type of person who wants to know details about prognosis and he reports that he would like to know. He appreciates honest and direct communication. We said that we would discuss with oncology so that we can provide accurate communication.   - Finally, we asked who his preferred HCDM is and he says that it is his domestic partner, Patrick Montgomery. We discussed that she would not be default HCPOA by state law and recommended completing formal HCPOA paperwork to allow her to be his surrogate if needed in the future. #Advance Care Planning:  HCDM:   HCDM (patient stated preference): Patrick Montgomery - Domestic Partner [phone]  Natural surrogate decision maker: majority of adult children and living parents  Advance Directive: none on file - wants to do HCPOA      #Controlled substances risk management.   - Patient has a signed pain medication agreement with Outpatient Palliative Care, completed on 01/28/23, as per standard of care.   - NCCSRS database was reviewed today and it was appropriate.   - Urine drug screen was performed at this visit. Findings: periodic screening has been appropriate.   - Patient has received information about safe storage and administration of medications.   - Patient has received a prescription for narcan and patient is not applicable.     F/u: 6-8 weeks    ----------------------------------------    Referring Provider: Ortencia Montgomery  Oncology Team: Patrick Montgomery  PCP: Patrick Lever, FNP    Interval Hx, 09/15/23:    No major changes since last visit. 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  They have ordered an MRI for further clarity and will likely pursue pudendal nerve block afterward.    patient is managing the pain with his current opioid regimen.  He has not found gabapentin to be all that helpful so he has self discontinued.    No current dyspnea, nausea, vomiting, diarrhea, or peripheral neuropathy.    He is having some constipation.  He is not having any dysuria or frequency.  Reports a decreased libido while on antiandrogen therapy.     Mood is good overall.  Eating well.    HPI: Patrick Montgomery is a 62 year old male with history of prostate cancer establishing with palliative care today primarily for pain control.    In terms of pain, patient locates the pain just to the right of his rectum, sometimes with radiation down his leg and up his back. He describes the pain as a sharp and excruciating pain. He notes that it is constant. It is worse after moving around all day and worse when sitting down.  It feels better if he massages the area.  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    Palliative Performance Scale: 60% - Ambulation: Reduced / unable to do hobby or some housework, significant disease / Self-Care:Occasional assist as necessary / Intake:Normal or reduced / Level of Conscious: Full or confusion    Social History:   Lives in [place], [place] with his domestic partner, Belenda Cruise (not officially married). They have an 18yo daughter. Also has a 26yo son who runs a comic  book store. Mother is living and also a source of support.    Watches church every Sunday - [employer]. Also belongs to a baptist church.     Occupation: Retired house painter  Hobbies: Likes to race go-karts and cars. Likes to watch his nephew do this as well.     Objective     Hematology/Oncology History Overview Note   In 03/2022, seen in ER for gross hematuria, worsening LUTS. CT bladder showing bladder mass.  In 04/2022, PSA 18. Had cysto, prostate biopsy, TURP. Prostate biopsy showed Gleason 4+5=9 (GG 5), 10/12 cores positive for cancer. TURP specimen showed mixed acinar/ductal features, cribriborm morphology.  In 05/2022, CT and bone scan showed no pelvic or distant mets  In 06/2022, ADT started with Degarelix, continued with Eligard.  In 06/2022, PSMA PET showed avidity in R inferior pubic ramus, suspicious for single bone met.  In 07/2022, abiraterone/prednisone started.  In 08/2022, RT to prostate/SV/nodes and pubic ramus bone met site, completed in 12/2022.  In 05/2023, abiraterone held due to hypertension and hypokalemia.     Prostate cancer (CMS-HCC)   06/17/2022 Initial Diagnosis    Prostate cancer (CMS-HCC)     06/17/2022 -  Cancer Staged    Staging form: Prostate, AJCC 8th Edition  - Clinical: Stage IIIC (cT4, cN0, cM0, PSA: 18, Grade Group: 5) - Signed by Young E Whang, MD on 06/19/2022       07/07/2022 - 07/07/2022 Endocrine/Hormone Therapy    OP DEGARELIX  Plan Provider: Young E Whang, MD     08/11/2022 Endocrine/Hormone Therapy    OP PROSTATE LEUPROLIDE (ELIGARD) 45 MG EVERY 6 MONTHS  Plan Provider: Young E Whang, MD     10 /10/2022 -  Radiation    Radiation Therapy Treatment Details (Noted on 09/10/2022)  Site: Prostate  Technique: IMRT  Goal: No goal specified  Planned Treatment Start Date: No planned start date specified       REVIEW OF SYSTEMS:  A comprehensive review of 10 systems was negative except for pertinent positives noted in HPI.    PHYSICAL EXAM:   GEN: NAD  CV: no evidence of cyanosis  PULM: No increased work of breathing  ABD: soft, ND  MSK: mild sarcopenia  EXT: No LE edema  NEURO: AO x 3, No gross focal deficits,  no myoclonus  PSYCH: mood and affect appropriate, no agitation            I personally spent 40 minutes face-to-face and non-face-to-face in the care of this patient, which includes all pre, intra, and post visit time on the date of service.  All documented time was specific to the E/M visit and does not include any procedures that may have been performed.        Redgie Grayer, MD, MEd  [facility] Outpatient Oncology Palliative Care

## 2023-09-27 ENCOUNTER — Ambulatory Visit
Admit: 2023-09-27 | Discharge: 2023-09-28 | Payer: PRIVATE HEALTH INSURANCE | Attending: Student in an Organized Health Care Education/Training Program | Primary: Student in an Organized Health Care Education/Training Program

## 2023-09-27 DIAGNOSIS — Z85828 Personal history of other malignant neoplasm of skin: Principal | ICD-10-CM

## 2023-09-27 NOTE — Unmapped (Signed)
Addended by: Inis Sizer A on: 09/27/2023 02:56 PM     Modules accepted: Level of Service

## 2023-09-27 NOTE — Unmapped (Signed)
Dermatology Note     Assessment and Plan:      S/p excision of BCC L forearm, KA R forearm  - Scheduled for KA removal of R forearm today but done at prior visit   - Healing well, sutures removed in clinic today  - Encouraged q47month FBSE     The patient was advised to call for an appointment should any new, changing, or symptomatic lesions develop.     RTC: Return in about 6 months (around 03/27/2024) for In-person. or sooner as needed   _________________________________________________________________      Chief Complaint     Chief Complaint   Patient presents with    Follow-up     Bilateral forearms       HPI     Patrick Montgomery is a 62 y.o. male who presents as a return patient for follow up excisions - BCC of L forearm, KA - R inferior forearm. Initially scheduled for KA removal today but both lesions removed at last visit. Doing well. No issues with healing. Due for suture removal.     The patient denies any other new or changing lesions or areas of concern.     Pertinent Past Medical History     History of skin cancer as outlined below:    Problem List       History of nonmelanoma skin cancer - Primary       Skin Cancer History- Non-Melanoma Skin Cancer     Diagnosis Location Biopsy Date Treatment date Procedure Surgeon   [facility] R temple   09/2021 [facility]   BCC R arm   09/2021 [facility]   BCC L back/shoulder   09/2021 [facility]   BCC L forearm  08/2023 excision flanigan   KA R forearm  08/2023 Excision  [place]                       Past Medical History, Family History, Social History, Medication List, Allergies, and Problem List were reviewed in the rooming section of Epic.     ROS: Other than symptoms mentioned in the HPI, no fevers, chills, or other skin complaints    Physical Examination     GENERAL: Well-appearing male in no acute distress, resting comfortably.  NEURO: Alert and oriented, answers questions appropriately  SKIN (Focal Skin Exam): Per patient request, examination of upper extremities was performed  - well healing excisions with sutures in place     (Approved Template 08/12/2020)

## 2023-10-01 ENCOUNTER — Telehealth
Admit: 2023-10-01 | Discharge: 2023-10-02 | Payer: PRIVATE HEALTH INSURANCE | Attending: Student in an Organized Health Care Education/Training Program | Primary: Student in an Organized Health Care Education/Training Program

## 2023-10-01 NOTE — Unmapped (Signed)
OUTPATIENT ONCOLOGY PALLIATIVE CARE CONSULT NOTE    Patrick Montgomery is a 62 y.o. male who is seen in consultation at the request of [name] for evaluation of Symptoms     Principal Diagnosis: Patrick Montgomery is a 62 y.o. male with prostate cancer, diagnosed in June 2023 now on ADT with Eliguard.  Disease sites include prostate, ?solitary bone metastasis.     Assessment/Plan:     #Cancer-Related Pain: ONgoing, MRI and exam most consistent with radiation related nerve-damage. Has been hesitant about pudendal nerve block due to worries about losing all sensatation in his leg. Moderate to severe pain in the right perineal area. Unclear if cancer related although this does seem to correlate with CT finding of increased conspicuity of right inferior pubic ramus mixed lytic sclerotic lesion.  Unclear if pain is from this is disease versus radiation changes.  Now describing more of a neuropathic burning sensation with radiation down leg.   - s/p xtampza as patient felt unclear efficacy and then self-discontinued when he ran out  - Will start wean of oxycodone to 5-10 mg q4 hrs PRN --> no more than 6 tablets per day, will go down monthly going forward  - Restart gabapentin 600mg  to TID with goal to increase to 900mg  TID, will rotate to pregabalin and add duloxetine if ineffective.   - Discussed pudendal nerve block in detail again - patient is more open to this but will discuss with his wife before making a decision   - Cont tylenol 1000 mg TID PRN  - Cont flexeril 10mg  nightly PRN  - Cont Lidocaine ointment to tender area of skin    Prior Medications:   - MS contin - didn't find effective.  Oxycodone 5 mg effective.  - Naproxen BID - mild benefit  - Tylenol - mild to no benefit    #Cancer-Related Fatigue: Improved and stable. Still able to be active. Gets out of house daily.   - Encouraged light exercise as able    #Early Satiety: Improved. Now gaining weight.   - If worsens, patinet open to nutrition referral in future  - CTM, possible marinol trial in future if appetite becomes issue    #Constipation: Improved and stable when taking bowel reimgen.   - cont senna to 2 tablet nightly  - Continue MiraLAX 1-2 times daily   - Low threshold to escalate regimen vs. Start naloxegol    #Skin Lesions: Ongoing, continues t have lesions removed by Derm. Hx of skin cancer. Previously seen by [facility] derm.   - mngt per Derm    #Mood/Coping: Stable.  Good support from his wife, mother and children. Stays positive by nature. Says he would be perfect if not for lingering pain.   - Also shares some concerns about loss of libido - will discuss in more detail with onc team at next visit    #Goals of Care/Prognostic Awareness: Previously provided patient with prepare for your care paperwork and introduced the concepts of CODE STATUS and the purpose of advance directives.  Patient appreciated having something to take at home and complete with his partner. Hasn't returned this to clinic yet.   - Remains on board with current plan of care.     From prior convos:  - Knows he has prostate cancer and is followed by Dr. Regino Schultze.  Knows that recent PSA was good.  Understands that he is taking medicine through 2026 for this.  Unsure if his disease is curable or not  and what the overall trajectory of his prognosis is, but would like to know.  Is a type of person who would want to know details about his disease and prognosis going forward.  - Patient shares his understanding that he has metastatic prostate cancer. He does not recall much about what to expect from this cancer, including about prognosis. He knows that there are several treatment options, but is not sure whether it is curable or not.   - We asked whether he is the type of person who wants to know details about prognosis and he reports that he would like to know. He appreciates honest and direct communication. We said that we would discuss with oncology so that we can provide accurate communication.   - Finally, we asked who his preferred HCDM is and he says that it is his domestic partner, [name]. We discussed that she would not be default HCPOA by state law and recommended completing formal HCPOA paperwork to allow her to be his surrogate if needed in the future.     #Advance Care Planning:  HCDM:   HCDM (patient stated preference): [name] - Domestic Partner [phone]  Natural surrogate decision maker: majority of adult children and living parents  Advance Directive: none on file - wants to do HCPOA      #Controlled substances risk management.   - Patient has a signed pain medication agreement with Outpatient Palliative Care, completed on 01/28/23, as per standard of care.   - NCCSRS database was reviewed today and it was appropriate.   - Urine drug screen was performed at this visit. Findings: periodic screening has been appropriate.   - Patient has received information about safe storage and administration of medications.   - Patient has received a prescription for narcan and patient is not applicable.     F/u: 6-8 weeks    ----------------------------------------    Referring Provider: Ortencia Kick  Oncology Team: [name]  PCP: Deneise Lever, FNP    Interval Hx, 10/01/23:    Since last visit, patient had MRI. 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 Pudendal nerve block recommended but patient has been hesistant to do worries of possible complication (specifically losing all ssensattion in his leg). We reviewed risks/benefits again and he is more open to the idea now, but watns to trial gabapentin at higher dose first. Pain is most bothersome at night.     No other acute concerns. No current dyspnea, nausea, vomiting, diarrhea, or peripheral neuropathy.    Reports ongoing decreased libido while on antiandrogen therapy.     Mood is good overall.  Eating well. Sleeping well.     HPI: Patrick Montgomery is a 62 year old male with history of prostate cancer establishing with palliative care today primarily for pain control.    In terms of pain, patient locates the pain just to the right of his rectum, sometimes with radiation down his leg and up his back. He describes the pain as a sharp and excruciating pain. He notes that it is constant. It is worse after moving around all day and worse when sitting down.  It feels better if he massages the area.  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    Palliative Performance Scale: 60% - Ambulation: Reduced / unable to  do hobby or some housework, significant disease / Self-Care:Occasional assist as necessary / Intake:Normal or reduced / Level of Conscious: Full or confusion    Social History:   Lives in [place], [place] with his domestic partner, Belenda Cruise (not officially married). They have an 18yo daughter. Also has a 26yo son who runs a comic book store. Mother is living and also a source of support.    Watches church every Sunday - [employer]. Also belongs to a [employer] church.     Occupation: Retired house painter  Hobbies: Likes to race go-karts and cars. Likes to watch his nephew do this as well.     Objective     Hematology/Oncology History Overview Note   In 03/2022, seen in ER for gross hematuria, worsening LUTS. CT bladder showing bladder mass.  In 04/2022, PSA 18. Had cysto, prostate biopsy, TURP. Prostate biopsy showed Gleason 4+5=9 (GG 5), 10/12 cores positive for cancer. TURP specimen showed mixed acinar/ductal features, cribriborm morphology.  In 05/2022, CT and bone scan showed no pelvic or distant mets  In 06/2022, ADT started with Degarelix, continued with Eligard.  In 06/2022, PSMA PET showed avidity in R inferior pubic ramus, suspicious for single bone met.  In 07/2022, abiraterone/prednisone started.  In 08/2022, RT to prostate/SV/nodes and pubic ramus bone met site, completed in 12/2022.  In 05/2023, abiraterone held due to hypertension and hypokalemia.     Prostate cancer (CMS-HCC)   06/17/2022 Initial Diagnosis    Prostate cancer (CMS-HCC)     06/17/2022 -  Cancer Staged    Staging form: Prostate, AJCC 8th Edition  - Clinical: Stage IIIC (cT4, cN0, cM0, PSA: 18, Grade Group: 5) - Signed by Young E Whang, MD on 06/19/2022       07/07/2022 - 07/07/2022 Endocrine/Hormone Therapy    OP DEGARELIX  Plan Provider: Young E Whang, MD     08/11/2022 Endocrine/Hormone Therapy    OP PROSTATE LEUPROLIDE (ELIGARD) 45 MG EVERY 6 MONTHS  Plan Provider: Young E Whang, MD     10 /10/2022 -  Radiation    Radiation Therapy Treatment Details (Noted on 09/10/2022)  Site: Prostate  Technique: IMRT  Goal: No goal specified  Planned Treatment Start Date: No planned start date specified       REVIEW OF SYSTEMS:  A comprehensive review of 10 systems was negative except for pertinent positives noted in HPI.    PHYSICAL EXAM:   TElephone call             The patient reports they are physically located in [place] and is currently: at home. I conducted a phone visit.  I spent 15 minutes on the phone call with the patient on the date of service .           Redgie Grayer, MD, MEd  [facility] Outpatient Oncology Palliative Care

## 2023-10-06 MED ORDER — OXYCODONE 5 MG TABLET
ORAL_TABLET | ORAL | 0 refills | 14 days | Status: CP | PRN
Start: 2023-10-06 — End: ?

## 2023-10-12 NOTE — Unmapped (Signed)
The [facility] has made a second and final attempt to reach this patient to refill the following medication:Zytiga.      We have left voicemails on the following phone numbers: [phone], have been unable to leave messages on the following phone numbers: [phone], have sent a text message to the following phone numbers: [phone], and have sent a Mychart questionnaire..    Dates contacted: 09/23/2023 & 09/30/2023  Last scheduled delivery: 08/31/2023    The patient may be at risk of non-compliance with this medication. The patient should call the [facility] at [phone]  Option 4, then Option 1: Oncology to refill medication.    Dorisann Frames   [facility] Specialty Technician

## 2023-10-14 NOTE — Unmapped (Signed)
error 

## 2023-10-19 NOTE — Unmapped (Signed)
Upon investigation, one additional attempt for patient contact is warranted.  The [facility] reached out to patient on 10/19/23 but was unable to LVM.  We will wait for the patient to contact the [facility].  If not contact is made in the appropriate time frame then the patient will be disenrolled from our specialty call services .     Kermit Balo, [facility]  [facility] Specialty Pharmacist

## 2023-10-21 MED FILL — ABIRATERONE 250 MG TABLET: ORAL | 30 days supply | Qty: 120 | Fill #1

## 2023-10-21 NOTE — Unmapped (Signed)
[facility] Refill Coordination Note    Patrick Montgomery, [place]: 1961-07-10  Phone: There are no phone numbers on file.      All above HIPAA information was verified with patient.         10/21/2023     6:22 AM   Specialty Rx Medication Refill Questionnaire   Which Medications would you like refilled and shipped? Abiraterone, completely out   Please list all current allergies: None   Have you missed any doses in the last 30 days? Yes   If Yes, how many doses have you missed ? 0-2   Have you had any changes to your medication(s) since your last refill? No   How many days remaining of each medication do you have at home? 0   Have you experienced any side effects in the last 30 days? No   Please enter the full address (street address, city, state, zip code) where you would like your medication(s) to be delivered to. [address], [place], [place] [address].   Please specify on which day you would like your medication(s) to arrive. Note: if you need your medication(s) within 3 days, please call the pharmacy to schedule your order at [phone]  10/21/2023   Has your insurance changed since your last refill? No   Would you like a pharmacist to call you to discuss your medication(s)? No   Do you require a signature for your package? (Note: if we are billing Medicare Part B or your order contains a controlled substance, we will require a signature) No         Completed refill call assessment today to schedule patient's medication shipment from the [facility] [phone]).  All relevant notes have been reviewed.       Confirmed patient received a Conservation officer, historic buildings and a Surveyor, mining with first shipment. The patient will receive a drug information handout for each medication shipped and additional FDA Medication Guides as required.         REFERRAL TO PHARMACIST     Referral to the pharmacist: Not needed      [facility]     Shipping address confirmed in Epic.     Delivery Scheduled: Yes, Expected medication delivery date: 10/21/23.     Medication will be delivered via Same Day Courier to the prescription address in Epic [place].    Arun Herrod M Elisabeth Cara   [facility] Specialty Technician

## 2023-11-02 DIAGNOSIS — C61 Malignant neoplasm of prostate: Principal | ICD-10-CM

## 2023-11-02 MED ORDER — OXYCODONE 5 MG TABLET
ORAL_TABLET | ORAL | 0 refills | 14 days | PRN
Start: 2023-11-02 — End: ?

## 2023-11-03 MED ORDER — OXYCODONE 5 MG TABLET
ORAL_TABLET | ORAL | 0 refills | 7 days | Status: CP | PRN
Start: 2023-11-03 — End: ?

## 2023-11-04 NOTE — Unmapped (Signed)
 Refill requested. PDMP reviewed and appropriate. Refill sent to preferred pharmacy.

## 2023-11-10 ENCOUNTER — Ambulatory Visit: Admit: 2023-11-10 | Payer: PRIVATE HEALTH INSURANCE

## 2023-11-15 NOTE — Unmapped (Signed)
OUTPATIENT ONCOLOGY PALLIATIVE CARE CONSULT NOTE    Patrick Montgomery is a 62 y.o. male who is seen in consultation at the request of [name] for evaluation of Symptoms     Principal Diagnosis: Patrick Montgomery is a 62 y.o. male with prostate cancer, diagnosed in June 2023 now on ADT with Eliguard.  Disease sites include prostate, ?solitary bone metastasis.     Assessment/Plan:     #Cancer-Related Pain: ONgoing, MRI and exam most consistent with radiation related nerve-damage. Has been hesitant about pudendal nerve block due to worries about losing all sensatation in his leg, but now more agreeable, especially given plans to taper opioids. Moderate to severe pain in the right perineal area. Unclear if cancer related although this does seem to correlate with CT finding of increased conspicuity of right inferior pubic ramus mixed lytic sclerotic lesion.  Unclear if pain is from this is disease versus radiation changes.  Now describing more of a neuropathic burning sensation with radiation down leg.   - s/p xtampza as patient felt unclear efficacy and then self-discontinued when he ran out  - Continue wean of oxycodone 5-10 mg q4 hrs PRN --> now no more than 5 tablets per day, # 140 tablets this month  -Has not found gabapentin effective enough, so we will trial Lyrica 75 mcg twice daily (will plan to uptitrate if some benefit)  - Discussed pudendal nerve block in detail again - patient open to this now and will message to anesthesia team to schedule  - Cont tylenol 1000 mg TID PRN  - Cont flexeril 10mg  nightly PRN  - Cont Lidocaine ointment to tender area of skin    Prior Medications:   - MS contin - didn't find effective.  Oxycodone 5 mg effective.  - Naproxen BID - mild benefit  - Tylenol - mild to no benefit    #Cancer-Related Fatigue: Improved and stable. Still able to be active. Gets out of house daily.   - Encouraged light exercise as able    #Early Satiety: Stable, but no longer improving, weight is stable..   - If worsens, patinet open to nutrition referral in future  - CTM, possible marinol trial in future if appetite becomes issue    #Constipation: Stable when taking bowel reimgen.   - cont senna to 2 tablet nightly  - Continue MiraLAX 1-2 times daily   - Low threshold to escalate regimen vs. Start naloxegol    #Skin Lesions: Ongoing, continues t have lesions removed by Derm. Hx of skin cancer. Previously seen by [facility] derm.   - mngt per Derm    #Mood/Coping: Stable.  Good support from his wife, mother and children. Stays positive by nature. Says he would be perfect if not for lingering pain.   - Also shares some concerns about loss of libido - will discuss in more detail with onc team at next visit    #Goals of Care/Prognostic Awareness: Previously provided patient with prepare for your care paperwork and introduced the concepts of CODE STATUS and the purpose of advance directives.  Patient appreciated having something to take at home and complete with his partner. Hasn't returned this to clinic yet.   - Remains on board with current plan of care.  Glad to hear his PSA is undetectable      From prior convos:  - Knows he has prostate cancer and is followed by Dr. Regino Schultze.  Knows that recent PSA was good.  Understands that he is taking  medicine through 2026 for this.  Unsure if his disease is curable or not and what the overall trajectory of his prognosis is, but would like to know.  Is a type of person who would want to know details about his disease and prognosis going forward.  - Patient shares his understanding that he has metastatic prostate cancer. He does not recall much about what to expect from this cancer, including about prognosis. He knows that there are several treatment options, but is not sure whether it is curable or not.   - We asked whether he is the type of person who wants to know details about prognosis and he reports that he would like to know. He appreciates honest and direct communication. We said that we would discuss with oncology so that we can provide accurate communication.   - Finally, we asked who his preferred HCDM is and he says that it is his domestic partner, Patrick Montgomery. We discussed that she would not be default HCPOA by state law and recommended completing formal HCPOA paperwork to allow her to be his surrogate if needed in the future.     #Advance Care Planning:  HCDM:   HCDM (patient stated preference): Patrick Montgomery - Domestic Partner [phone]  Natural surrogate decision maker: majority of adult children and living parents  Advance Directive: none on file - wants to do HCPOA      #Controlled substances risk management.   - Patient has a signed pain medication agreement with Outpatient Palliative Care, completed on 01/28/23, as per standard of care.   - [place]CCSRS database was reviewed today and it was appropriate.   - Urine drug screen was performed at this visit. Findings: periodic screening has been appropriate.   - Patient has received information about safe storage and administration of medications.   - Patient has received a prescription for narcan and patient is not applicable.     F/u: 6-8 weeks    ----------------------------------------    Referring Provider: Ortencia Kick  Oncology Team: [name]  PCP: Deneise Lever, FNP    Interval Hx, 11/16/23:    No major updates since his last visit.  PSA checked today and undetectable.  Oncology plan remains unchanged.    Patient, however, continues to report ongoing pain in the same location.  After initially thinking gabapentin was helping, he decided it was no longer beneficial and stopped taking.  He would like to trial Lyrica.    He continues to take as needed oxycodone, typically maximum number of tablets per day.  He understands that this is not the ideal long-term treatment for chronic pain.  He understands that we will be weaning the oxycodone going forward and he is now more open to a pudendal nerve block.    No other acute concerns. No current dyspnea, nausea, vomiting, diarrhea, or peripheral neuropathy. Mood is good overall.  Sleeping well.     Weight is stable, but no longer increasing as it was prior.    HPI: Mr. Kornfeld is a 62 year old male with history of prostate cancer establishing with palliative care today primarily for pain control.    In terms of pain, patient locates the pain just to the right of his rectum, sometimes with radiation down his leg and up his back. He describes the pain as a sharp and excruciating pain. He notes that it is constant. It is worse after moving around all day and worse when sitting down.  It feels better if he  massages the area.  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    Palliative Performance Scale: 60% - Ambulation: Reduced / unable to do hobby or some housework, significant disease / Self-Care:Occasional assist as necessary / Intake:Normal or reduced / Level of Conscious: Full or confusion    Social History:   Lives in [place], [place] with his domestic partner, Belenda Cruise (not officially married). They have an 18yo daughter. Also has a 26yo son who runs a comic book store. Mother is living and also a source of support.    Watches church every Sunday - [employer]. Also belongs to a [employer] church.     Occupation: Retired house painter  Hobbies: Likes to race go-karts and cars. Likes to watch his nephew do this as well.     Objective     Hematology/Oncology History Overview Note   In 03/2022, seen in ER for gross hematuria, worsening LUTS. CT bladder showing bladder mass.  In 04/2022, PSA 18. Had cysto, prostate biopsy, TURP. Prostate biopsy showed Gleason 4+5=9 (GG 5), 10/12 cores positive for cancer. TURP specimen showed mixed acinar/ductal features, cribriborm morphology.  In 05/2022, CT and bone scan showed no pelvic or distant mets  In 06/2022, ADT started with Degarelix, continued with Eligard.  In 06/2022, PSMA PET showed avidity in R inferior pubic ramus, suspicious for single bone met.  In 07/2022, abiraterone/prednisone started.  In 08/2022, RT to prostate/SV/nodes and pubic ramus bone met site, completed in 12/2022.  In 05/2023, abiraterone held due to hypertension and hypokalemia.     Prostate cancer (CMS-HCC)   06/17/2022 Initial Diagnosis    Prostate cancer (CMS-HCC)     06/17/2022 -  Cancer Staged    Staging form: Prostate, AJCC 8th Edition  - Clinical: Stage IIIC (cT4, cN0, cM0, PSA: 18, Grade Group: 5) - Signed by Young E Whang, MD on 06/19/2022       07/07/2022 - 07/07/2022 Endocrine/Hormone Therapy    OP DEGARELIX  Plan Provider: Young E Whang, MD     08/11/2022 Endocrine/Hormone Therapy    OP PROSTATE LEUPROLIDE (ELIGARD) 45 MG EVERY 6 MONTHS  Plan Provider: Young E Whang, MD     10 /10/2022 - 10/02/2023 Radiation    Radiation Therapy Treatment Details (09/10/2022 - 10/02/2023)  Site: Prostate  Technique: IMRT  Goal: No goal specified  Planned Treatment Start Date: No planned start date specified       REVIEW OF SYSTEMS:  A comprehensive review of 10 systems was negative except for pertinent positives noted in HPI.    PHYSICAL EXAM:   GEN: NAD    CV: RRR, no murmurs appreciated, no evidence of cyanosis  PULM: No increased work of breathing  ABD: soft, NT/ND,  EXT: No LE edema  NEURO: AO x 3, No gross focal deficits  PSYCH: mood and affect appropriate, no agitation       I personally spent 40 minutes face-to-face and non-face-to-face in the care of this patient, which includes all pre, intra, and post visit time on the date of service.  All documented time was specific to the E/M visit and does not include any procedures that may have been performed.      Redgie Grayer, MD, MEd  [facility] Outpatient Oncology Palliative Care

## 2023-11-16 ENCOUNTER — Ambulatory Visit
Admit: 2023-11-16 | Discharge: 2023-11-16 | Payer: PRIVATE HEALTH INSURANCE | Attending: Hematology & Oncology | Primary: Hematology & Oncology

## 2023-11-16 ENCOUNTER — Other Ambulatory Visit: Admit: 2023-11-16 | Discharge: 2023-11-16 | Payer: PRIVATE HEALTH INSURANCE

## 2023-11-16 ENCOUNTER — Ambulatory Visit
Admit: 2023-11-16 | Discharge: 2023-11-16 | Payer: PRIVATE HEALTH INSURANCE | Attending: Student in an Organized Health Care Education/Training Program | Primary: Student in an Organized Health Care Education/Training Program

## 2023-11-16 DIAGNOSIS — Z515 Encounter for palliative care: Principal | ICD-10-CM

## 2023-11-16 DIAGNOSIS — C61 Malignant neoplasm of prostate: Principal | ICD-10-CM

## 2023-11-16 DIAGNOSIS — G893 Neoplasm related pain (acute) (chronic): Principal | ICD-10-CM

## 2023-11-16 DIAGNOSIS — R530 Neoplastic (malignant) related fatigue: Principal | ICD-10-CM

## 2023-11-16 DIAGNOSIS — K5903 Drug induced constipation: Principal | ICD-10-CM

## 2023-11-16 LAB — HEPATIC FUNCTION PANEL
ALBUMIN: 3.8 g/dL (ref 3.4–5.0)
ALKALINE PHOSPHATASE: 95 U/L (ref 46–116)
ALT (SGPT): 13 U/L (ref 10–49)
AST (SGOT): 14 U/L (ref <=34)
BILIRUBIN DIRECT: 0.1 mg/dL (ref 0.00–0.30)
BILIRUBIN TOTAL: 0.3 mg/dL (ref 0.3–1.2)
PROTEIN TOTAL: 6.4 g/dL (ref 5.7–8.2)

## 2023-11-16 LAB — PSA: PROSTATE SPECIFIC ANTIGEN: <0.04 ng/mL (ref 0.00–4.00)

## 2023-11-16 LAB — POTASSIUM: POTASSIUM: 3.7 mmol/L (ref 3.5–5.1)

## 2023-11-16 MED ORDER — OXYCODONE 5 MG TABLET
ORAL_TABLET | ORAL | 0 refills | 12.00 days | Status: CP | PRN
Start: 2023-11-16 — End: ?

## 2023-11-16 MED ORDER — PREGABALIN 75 MG CAPSULE
ORAL_CAPSULE | Freq: Two times a day (BID) | ORAL | 0 refills | 30.00 days | Status: CP
Start: 2023-11-16 — End: 2024-11-15

## 2023-11-16 MED ORDER — SENNOSIDES 8.6 MG TABLET
ORAL_TABLET | Freq: Every evening | ORAL | 2 refills | 30.00 days | Status: CP
Start: 2023-11-16 — End: 2024-11-15

## 2023-11-16 NOTE — Unmapped (Addendum)
It was a pleasure to see you in the clinic today.    Your PSA remains undetectable. We will continue our current course of treatment. We will plan to continue treatment until 9/20205.    Plan to return to clinic in 3 months for your next Eligard injection.     Please call [phone] to reach my nurse navigator Konrad Penta for any issues.    For emergencies on Nights, Weekends and Holidays  Call [phone] for help.      Griffin Basil, MD, PhD  Professor of Medicine  Division of Oncology    [facility]  Genitourinary Oncology Clinic  Nurse Navigator: Konrad Penta  Fax: [phone]     Lab Results   Component Value Date    PSA <0.04 11/16/2023    PSA <0.04 09/15/2023    PSA <0.04 08/18/2023    PSA <0.04 05/18/2023    PSA <0.04 02/16/2023    PSA <0.04 01/07/2023

## 2023-11-16 NOTE — Unmapped (Signed)
[facility] Visit Note    Patient Name: Patrick Montgomery  Patient Age: 62 y.o.  Encounter Date: 11/16/2023  Attending Provider:  Delilah Mulgrew E. 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    Patrick Montgomery is a 62 y.o. male who presents for management of newly diagnosed prostate cancer. He was initially seen in the ER In May 2023 for gross hematuria and  worsening LUTS. A CT scan of the bladder revealed a bladder mass. In June 2023, he was found to have a PSA of 18. He underwent cystoscopy, prostate biopsy, and TURP. Prostate biopsy showed Gleason 4+5=9 (GG 5), 10/12 cores positive for cancer. TURP specimen showed mixed acinar/ductal features, cribriborm morphology. In July 2023, CT and bone scan showed no pelvic or distant metastases.    On 06/18/22, we discussed management of his recent diagnosis of prostate cancer. Given that he has been diagnosed with prostate cancer at a very Patrick Montgomery age, I informed the patient that there may be features that indicate a higher risk of hereditary prostate cancer. 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   Based on current imaging, his cancer is confined to the prostate. A PSMA PET scan will be ordered to see if the cancer has metastasized.     On 07/07/2022, pt is now ready to move ahead with treatment. Will proceed with ADT plus abiraterone for two years, per STAMPEDE.  PSMA PET still undergoing authorization.    On 08/11/2022, most recent PSA on 08/01/2022 is 0.27. PSMA PET scan from 07/27/2022 showed avidity involving the right inferior pubic ramus which is suspicious for single site of osseous metastatic disease. Will proceed with Eligard shot and proceed with radiation with Dr. Carles Collet. May consider radiating single site of metastatic disease at the right inferior pubic ramus     On 12/03/2022, PSA <0.04, on ADT plus abi, s/p RT to all involved sites. For pain, unlikely to be from cancer, may benefit from pain clinic management long-term.    On 02/16/23, PSA remains <0.04. Received ADT today and will continue abiraterone/prednisone. BP mildly elevated at 146/81, will monitor. Chronic pain being addressed with support from palliative care team. Will RTC in 3 months.    On 05/18/2023, PSA remains undetectable. BP elevated at 166/72, potassium 3.2 today. Hold abiraterone for now, will have CPP Katina Degree, Pharm D follow up on these and restart abi (with or without dose reduction).     On 08/18/23, PSA remains undetectable. His BP improved from last visit at 151/74, K 4.4 and still remains on potassium supplement. He remains off of abiraterone. Has not checked BP at home. He is due for Eligard today. Given elevated BP, will increase amlodipine to 10 mg for BP management and restart abiraterone and continue prednisone.    Today on  11/16/2023, PSA remains undetectable.Continue current course of treatment.      Plan:  Continue ADT with Eligard last given on 9/18//2024, next due on 02/14/24. Anticipate 3 years of therapy (till 8-07/2025)  --Follow PSA  Continue Abiraterone 1000 mg daily and continue prednisone 5 mg daily x 2 years (Until ~07/2024)  Potassium supplement continued  Follow labs/BP  Instructed patient to bring log of BP readings at home at next follow up  3. NGS returned without actionable mutations, germline testing negative on [facility] normal  4. Follow up with palliative care  4. RTC in 3 months    I personally spent 35 minutes face-to-face and non-face-to-face in the care of this patient, which includes all pre, intra, and post visit time on the date of service.  All documented time was specific to the E/M visit and does not include any procedures that may have been performed.      Reason for Visit  Follow up of prostate cancer.     History of Present Illness:  Hematology/Oncology History Overview Note   In 03/2022, seen in ER for gross hematuria, worsening LUTS. CT bladder showing bladder mass.  In 04/2022, PSA 18. Had cysto, prostate biopsy, TURP. Prostate biopsy showed Gleason 4+5=9 (GG 5), 10/12 cores positive for cancer. TURP specimen showed mixed acinar/ductal features, cribriborm morphology.  In 05/2022, CT and bone scan showed no pelvic or distant mets  In 06/2022, ADT started with Degarelix, continued with Eligard.  In 06/2022, PSMA PET showed avidity in R inferior pubic ramus, suspicious for single bone met.  In 07/2022, abiraterone/prednisone started.  In 08/2022, RT to prostate/SV/nodes and pubic ramus bone met site, completed in 12/2022.  In 05/2023, abiraterone held due to hypertension and hypokalemia.     Prostate cancer (CMS-HCC)   06/17/2022 Initial Diagnosis    Prostate cancer (CMS-HCC)     06/17/2022 -  Cancer Staged    Staging form: Prostate, AJCC 8th Edition  - Clinical: Stage IIIC (cT4, cN0, cM0, PSA: 18, Grade Group: 5) - Signed by Maurie Boettcher, MD on 06/19/2022       07/07/2022 - 07/07/2022 Endocrine/Hormone Therapy    OP DEGARELIX  Plan Provider: Maurie Boettcher, MD     08/11/2022 Endocrine/Hormone Therapy    OP PROSTATE LEUPROLIDE (ELIGARD) 45 MG EVERY 6 MONTHS  Plan Provider: Maurie Boettcher, MD     09/10/2022 - 10/02/2023 Radiation    Radiation Therapy Treatment Details (09/10/2022 - 10/02/2023)  Site: Prostate  Technique: IMRT  Goal: No goal specified  Planned Treatment Start Date: No planned start date specified         Interval history  The patient returns for scheduled follow up, unaccompanied He is doing okay overall, He reports some improvement of his hot flashes. No other concerns noted.       Allergies:  No Known Allergies    Current Medications:    Current Outpatient Medications:     abiraterone (ZYTIGA) 250 mg tablet, Take 4 tablets (1,000 mg total) by mouth daily., Disp: 120 tablet, Rfl: 11    amlodipine (NORVASC) 10 MG tablet, Take 1 tablet (10 mg total) by mouth daily., Disp: 30 tablet, Rfl: 11    aspirin (ECOTRIN) 81 MG tablet, Take 1 tablet (81 mg total) by mouth daily., Disp: 90 tablet, Rfl: 3    atorvastatin (LIPITOR) 10 MG tablet, Take 1 tablet (10 mg total) by mouth daily., Disp: 90 tablet, Rfl: 0    naloxone (NARCAN) 4 mg nasal spray,  One spray in either nostril once for known/suspected opioid overdose. May repeat every 2-3 minutes in alternating nostril til EMS arrives, Disp: 2 each, Rfl: 3    potassium chloride (KLOR-CON) 10 MEQ CR tablet, Take 1 tablet (10 mEq total) by mouth daily., Disp: 30 tablet, Rfl: 3    predniSONE (DELTASONE) 5 MG tablet, Take 1 tablet (5 mg total) by mouth daily., Disp: 30 tablet, Rfl: 11    oxyCODONE (ROXICODONE) 5 MG immediate release tablet, Take 1-2 tablets (5-10 mg total) by mouth every four (4) hours as needed for pain. No more than 5 tablets per day., Disp: 140 tablet, Rfl: 0    pregabalin (LYRICA) 75 MG capsule, Take 1 capsule (75 mg total) by mouth two (2) times a day., Disp: 60 capsule, Rfl: 0    senna (SENOKOT) 8.6 mg tablet, Take 1-2 tablets by mouth nightly., Disp: 60 tablet, Rfl: 2    Past Medical History and Social History  Past Medical History:   Diagnosis Date    Anxiety     Arthritis     Basal cell carcinoma     Cancer (CMS-HCC)     skin    Gout     Kidney stones     Squamous cell skin cancer     Stroke (CMS-HCC)       Past Surgical History:   Procedure Laterality Date    PR BIOPSY OF PROSTATE,NEEDLE/PUNCH N/A 05/21/2022    Procedure: IMAGE GUIDED BIOPSY, PROSTATE; NEEDLE OR PUNCH, SINGLE OR MULTIPLE, ANY APPROACH;  Surgeon: Phillips Grout, MD; Location: [facility];  Service: Urology    PR TRANSURETHRAL ELEC-SURG PROSTATECTOM N/A 05/21/2022    Procedure: TRANSURETHRAL ELECTROSURGICAL RESECT PROSTATE, W/CONTORL POSTOP BLEED, COMPLETE (VASECT, MEATOT, CYSTO ETC);  Surgeon: Phillips Grout, MD;  [facility];  Service: Urology    SKIN BIOPSY      skin cancer removal           Social History     Occupational History    Not on file   Tobacco Use    Smoking status: Every Day     Current packs/day: 1.50     Average packs/day: 1.5 packs/day for 45.0 years (67.5 ttl pk-yrs)     Types: Cigarettes    Smokeless tobacco: Never   Vaping Use    Vaping status: Never Used   Substance and Sexual Activity    Alcohol use: No     Comment: socially     Drug use: Yes     Types: Marijuana    Sexual activity: Not Currently     Partners: Female     Birth control/protection: None       Family History  Family History   Problem Relation Age of Onset    Cancer Father         Skin    Melanoma Father     Squamous cell carcinoma Father     Rashes / Skin problems Father     Cancer Brother         skin     Substance Abuse Disorder Neg Hx     Basal cell carcinoma Neg Hx      Prostate Cancer Family History Assessment:  History of cancer in children (yes/no; if yes, what type AND age of diagnosis): No  Total number of siblings (including deceased): 2  History of cancer in siblings (yes/no; if yes, provide relation, type of cancer, AND age of diagnosis): Brother superficial  basal cell; age unknown  History of cancer in parents (yes/no; if yes, please specify parent, type of cancer, AND age of diagnosis): Father superficial basal cell carcinoma; age unknown  History of cancer in aunts/uncles/grandparents (yes/no; if yes, provide relation, type of cancer, AND age of diagnosis): No      Review of Systems:  A comprehensive review of 10 systems was negative except for pertinent positives noted in HPI.    Physical Exam:    VITAL SIGNS:  BP (S) 148/77  - Pulse 50  - Temp 36.6 ??C (97.9 ??F) (Temporal)  - Resp 16  - Ht 167.6 cm (5' 6)  - Wt 63.2 kg (139 lb 4.8 oz)  - SpO2 100%  - BMI 22.48 kg/m??   ECOG Performance Status: 1  GENERAL: Well-developed, well-nourished patient in no acute distress.  HEAD: Normocephalic and atraumatic.  EYES: Conjunctivae are normal. No scleral icterus.  MOUTH/THROAT: Oropharynx is clear and moist.  No mucosal lesions.  NECK: Supple, no thyromegaly.  LYMPHATICS: No palpable cervical, supraclavicular, or axillary adenopathy.  CARDIOVASCULAR: Normal rate, regular rhythm and normal heart sounds.  Exam reveals no gallop and no friction rub.  No murmur heard.  PULMONARY/CHEST: Effort normal and breath sounds normal. No respiratory distress.  ABDOMINAL:  Soft. There is no distension. There is no tenderness. There is no rebound and no guarding.  MUSCULOSKELETAL: No clubbing, cyanosis, or lower extremity edema.  PSYCHIATRIC: Alert and oriented.  Normal mood and affect.  NEUROLOGIC: No focal motor deficit. Normal gait.  SKIN: Skin is warm, dry, and intact.      Results/Orders:    Lab on 11/16/2023   Component Date Value Ref Range Status    PSA 11/16/2023 <0.04  0.00 - 4.00 ng/mL Final    Albumin 11/16/2023 3.8  3.4 - 5.0 g/dL Final    Total Protein 11/16/2023 6.4  5.7 - 8.2 g/dL Final    Total Bilirubin 11/16/2023 0.3  0.3 - 1.2 mg/dL Final    Bilirubin, Direct 11/16/2023 0.10  0.00 - 0.30 mg/dL Final    AST 16/08/9603 14  <=34 U/L Final    ALT 11/16/2023 13  10 - 49 U/L Final    Alkaline Phosphatase 11/16/2023 95  46 - 116 U/L Final    Potassium 11/16/2023 3.7  3.5 - 5.1 mmol/L Final       Lab Results   Component Value Date    PSA <0.04 11/16/2023    PSA <0.04 09/15/2023    PSA <0.04 08/18/2023    PSA <0.04 05/18/2023    PSA <0.04 02/16/2023    PSA <0.04 01/07/2023    PSA <0.04 12/03/2022    PSA <0.04 10/30/2022    PSA <0.04 10/16/2022    PSA 0.09 10/05/2022         Orders placed or performed in visit on 11/16/23    Clinic Appointment Request Physician    Clinic Appointment Request Physician (Eligard), Injection    LAB Appointment Request       Molecular Pathology  Tumor mutation profiling  [facility]: ZMYM3 mutation, TMB 3.7, MSS, no actionable mutations    Germline testing  Negative on [facility] normal draw    Pathology:  A: Prostate, right apex, core biopsy  - Prostatic adenocarcinoma, Gleason score 4 + 4 = 8 (Grade group 4) involving 2 of 2 cores, approximately 4 and 1 mm in linear extent, approximately 20% of total core length.      B: Prostate, left apex, core biopsy  -  Prostatic adenocarcinoma, Gleason score 4 + 4 = 8 (Grade group 4) involving 2 of 2 cores, approximately 7 and 5 mm in linear extent, approximately 60% of total core length.   - Perineural invasion identified in this case.      C: Prostate, left mid, core biopsy  - Prostatic adenocarcinoma, Gleason score 4 + 5 = 9 (Grade group 5) involving 2 of 2 cores, approximately 6 and 3mm in linear extent, approximately 90% of total core length.      D: Prostate, right mid, core biopsy  - Prostatic adenocarcinoma, Gleason score 4 + 5 = 9 (Grade group 5) involving 2 of 2 cores, approximately 6 and 5 mm in linear extent, approximately 70% of total core length.      E: Prostate, right base, core biopsy  - Benign prostate tissue.      F: Prostate, left base, core biopsy  - Prostatic adenocarcinoma, Gleason score 4 + 4 = 8 (Grade group 4)  involving 2 of 2 cores, approximately 3 and 1 mm in linear extent, approximately 20% of total core length.      G: Bladder neck and bilateral median lobe, transurethral resection  - Involved by prostatic adenocarcinoma with mixed acinar and ductal features, Gleason score 4 + 4 = 8 (Grade group 4)    - Lymphovascular invasion identified     The pattern 4 in this case demonstrates cribriform morphology.      Imaging results:    CT Chest w Contrast 06/11/22:   No intrathoracic metastasis.       CT Abdomen Pelvis W Contrast 06/11/22:     Enlarged irregular prostate correlates with history of biopsy-proven prostate cancer. There is redemonstrated irregularity of the posterior bladder and increased irregularity of the prostate at the bladder margin compared to prior may be secondary to tumor versus postsurgical/post TURP change. Consider attention on follow-up versus further evaluation with PSMA PET scan.       Questionable sclerotic changes of the right inferior pubic ramus are indeterminate and may represent normal variation although metastatic osseous disease is not excluded. Attention on follow-up bone scan. There is no other evidence of metastatic disease in the abdomen/pelvis.       Additional chronic and incidental findings as described.       Please see same day CT chest report for detailed findings above the diaphragm     NM Bone Scan Whole Body 06/11/22  No findings suggestive of osseous metastatic disease     PET CT 07/27/2022:   Impression   1.Ill-defined tracer avidity associated with the prostate, left greater than right, likely representing biopsy-proven prostate carcinoma.   2.Moderate avidity involving the right inferior pubic ramus which is suspicious for single site of osseous metastatic disease. Otherwise, no definite evidence of additional metastatic disease. More specifically, no avid adenopathy. Subcentimeter pulmonary nodules which are below the resolution of PET/CT and require continued attention on follow-up.     CT CAP   Result Date: 12/15/2022  Impression:   --Mild rectosigmoid wall thickening without surrounding inflammatory changes, favored secondary to radiation changes, with additional imaging findings suggestive of slow bowel transit.   --Increased conspicuity of right inferior pubic ramus mixed lytic sclerotic lesion which may reflect posttreatment changes versus worsening site of disease. No new osseous metastases.     MRI Pelvis Wo Contrast  Result Date: 09/16/2023  Impression:   Poor visualization of the  pudendal and perineal nerves bilaterally.      There is  however, extensive post radiation changes of the right-sided adductor and levator ani musculature surrounding the treated lesion within the right inferior pubic rami. These changes correspond to the expected location of anterior distribution of the above-mentioned nerves which indicate the possibility of of its involvement by post radiation damage or mass effect plus or minus scarring as a possible etiology of the patient's symptoms.       I attest that I, Alto Denver, personally documented this note while acting as scribe for Maurie Boettcher, MD       Alto Denver, Scribe.  11/16/2023     Documentation assistance provided by Medical Scribe, Alto Denver who was present during the entirety of the visit. I reviewed the note below and validated all of the information provided to ensure accuracy and completeness.      Maurie Boettcher, MD

## 2023-11-18 DIAGNOSIS — G588 Other specified mononeuropathies: Principal | ICD-10-CM

## 2023-11-18 MED FILL — ABIRATERONE 250 MG TABLET: ORAL | 30 days supply | Qty: 120 | Fill #2

## 2023-11-18 NOTE — Unmapped (Signed)
[facility] Refill Coordination Note    Specialty Medication(s) to be Shipped:   Hematology/Oncology: abiraterone 250mg     Other medication(s) to be shipped: No additional medications requested for fill at this time     Patrick Montgomery, DOB: 1961-11-03  Phone: There are no phone numbers on file.      All above HIPAA information was verified with patient.     Was a Nurse, learning disability used for this call? No    Completed refill call assessment today to schedule patient's medication shipment from the [facility]  [phone]).  All relevant notes have been reviewed.     Specialty medication(s) and dose(s) confirmed: Regimen is correct and unchanged.   Changes to medications: Patrick Montgomery reports no changes at this time.  Changes to insurance: No  New side effects reported not previously addressed with a pharmacist or physician: None reported  Questions for the pharmacist: No    Confirmed patient received a Conservation officer, historic buildings and a Surveyor, mining with first shipment. The patient will receive a drug information handout for each medication shipped and additional FDA Medication Guides as required.       DISEASE/MEDICATION-SPECIFIC INFORMATION        N/A    SPECIALTY MEDICATION ADHERENCE     Medication Adherence    Patient reported X missed doses in the last month: 0  Specialty Medication: Abiraterone 250 mg  Patient is on additional specialty medications: No  Informant: patient     Were doses missed due to medication being on hold? No    Abiraterone 250 mg: 3 days of medicine on hand       REFERRAL TO PHARMACIST     Referral to the pharmacist: Not needed      [facility]     Shipping address confirmed in Epic.       Delivery Scheduled: Yes, Expected medication delivery date: 11/18/23.     Medication will be delivered via Same Day Courier to the prescription address in Epic [place].    Patrick Montgomery M Elisabeth Montgomery   [facility]  Specialty Technician

## 2023-11-19 DIAGNOSIS — G588 Other specified mononeuropathies: Principal | ICD-10-CM

## 2023-12-14 NOTE — Unmapped (Signed)
[facility] Refill Coordination Note    Specialty Medication(s) to be Shipped:   Hematology/Oncology: abiraterone 250mg     Other medication(s) to be shipped: No additional medications requested for fill at this time     Patrick Montgomery, DOB: January 02, 1961  Phone: There are no phone numbers on file.      All above HIPAA information was verified with patient.     Was a Nurse, learning disability used for this call? No    Completed refill call assessment today to schedule patient's medication shipment from the [facility]  [phone]).  All relevant notes have been reviewed.     Specialty medication(s) and dose(s) confirmed: Regimen is correct and unchanged.   Changes to medications: Mikhai reports no changes at this time.  Changes to insurance: No  New side effects reported not previously addressed with a pharmacist or physician: Yes - Patient reports abdominal pain. Patient would not like to speak to the pharmacist today. Their provider is not aware.  Questions for the pharmacist: No    Confirmed patient received a Conservation officer, historic buildings and a Surveyor, mining with first shipment. The patient will receive a drug information handout for each medication shipped and additional FDA Medication Guides as required.       DISEASE/MEDICATION-SPECIFIC INFORMATION        N/A    SPECIALTY MEDICATION ADHERENCE     Medication Adherence    Patient reported X missed doses in the last month: 0  Specialty Medication: Abiraterone 250 mg  Patient is on additional specialty medications: No  Informant: patient     Were doses missed due to medication being on hold? No    Abiraterone 250 mg: 7 days of medicine on hand       REFERRAL TO PHARMACIST     Referral to the pharmacist: Not needed      [facility]     Shipping address confirmed in Epic.       Delivery Scheduled: Yes, Expected medication delivery date: 12/18/23.     Medication will be delivered via Next Day Courier to the prescription address in [employer] [place].    Meridian Scherger M Elisabeth Cara   [facility]  Specialty Technician

## 2023-12-15 ENCOUNTER — Ambulatory Visit
Admit: 2023-12-15 | Discharge: 2023-12-16 | Payer: PRIVATE HEALTH INSURANCE | Attending: MOHS-Micrographic Surgery | Primary: MOHS-Micrographic Surgery

## 2023-12-15 DIAGNOSIS — C61 Malignant neoplasm of prostate: Principal | ICD-10-CM

## 2023-12-15 MED ORDER — OXYCODONE 5 MG TABLET
ORAL_TABLET | ORAL | 0 refills | 12.00 days | PRN
Start: 2023-12-15 — End: ?

## 2023-12-15 NOTE — Unmapped (Signed)
Consult from:    Whitney Post, MD     Reason for visit/CC:   Basal cell carcinoma, left ear    HPI: Pt is seen in consultation at the request of the above provider for evaluation and treatment options, including Mohs micrographic surgery, for the above tumor.  It has not been treated in the past. It has been present for years and caused symptoms such as elevation, crusting, redness, scaling, non-healing, but no pain, itching, bleeding, or other symptoms.        Medications and allergies: see patient chart.    Review of systems: Reviewed 10 systems and notable for skin cancer as above and arthritis, hx stroke, hx prostate ca.  All other systems reviewed are unremarkable/negative, unless noted in the HPI. Past medical history, surgical history, family history, social history were also reviewed and are noted in the chart/questionnaire.      PE: Well-developed, well-nourished, alert and oriented x 4, here with significant other. Vitals reviewed in chart (if available).  Exam reveals a 1.6 cm erythematous patch and biopsy scar on the left ear scaphoid fossa/superior antihelix. The adjacent skin on the ear and cheek revealed photodamage, rhytids, telangiectasias, scattered brown macules and scaly patches.        Assessment:     1) Basal cell carcinoma, left ear  2) photodamage  3) solar lentigenes   4) hx of NMSC    Plan:   1. Due to size, location, pathology, and ill-defined margins and the likelihood of subclinical extension as well as the need to conserve normal surrounding tissue, the patient and tumor were deemed acceptable for Mohs micrographic surgery (MMS).  The nature and purpose of the procedure, associated benefits and risks including recurrence and scarring, possible complications such as pain, infection, and bleeding, and alternative methods of treatment if appropriate were discussed with the patient during consent. The diagnosis was verified by reviewing the pathology/report. The lesion location was verified by the patient, by reviewing previous notes, pathology reports, and by photographs as well as angulation measurements if available.  Informed consent was reviewed and signed by the patient, and timeout was performed at 0830. See op note below. (Note that per the definition of Mohs micrographic surgery, the Mohs surgeon listed below acted as both the surgeon and pathologist for evaluation of the Mohs sections)  2. For the photodamage and solar lentigenes, sun protection discussed/information given on OTC sunscreens, and we recommend continued regular follow-up with primary dermatologist every 6 months or sooner for any growing, bleeding, or changing lesions.    Accession: [id number]   [facility]  Attending surgeon:                      Coralie Carpen, MD  Assistants:                                   Elpidio Anis  Clinical diagnosis:                        As above  Tumor location:                            As above  Tumor size:  As above  Anesthesia:                                  0.5% lidocaine with epinephrine  Total volume of anesthesia:         <10ccs  Preparation:                                 2% chlorhexidine, 70% isopropyl alcohol/Chlora-prep  Surgical defect/wound size:         2.0x1.4 cm  Postoperative dx:                         Same  Preoperative medications:            None  Perioperative medications:           None  Postoperative medications:          None  EBL:                                             <5ccs  Complications:                             None    Stages:  Stage 1: The patient was positioned, marked, prepped, and anesthetized with local anesthesia as above.  The tumor was first debulked and then excised with an approx. 2mm margin.  Hemostasis was achieved with electrocautery as needed.  The specimen was then oriented, subdivided/relaxed, inked, and processed using Mohs technique.  Evaluation of slides by the Mohs surgeon revealed residual tumor at the margins.  Pathology: Groups of atypical basaloid cells extending from the epidermis into the dermis and subcutaneous tissue      Stage 2: An additional 2-39mm margin was excised and included cartilage.  Hemostasis was achieved with electrocautery as needed.  The specimen was then oriented, subdivided/relaxed, inked, and processed using Mohs technique.  Evaluation of slides by the Mohs surgeon revealed clear tumor margins.     RECONSTRUCTION:  Full thickness skin graft  Operation:  FTSG repair of the above MMS defect  Indication:  Functional and aesthetic reconstruction of the above wound  Donor site:  Left postauricular  Anesthesia:  As above  Total volume of anesthesia: <10ccs  Preparation:  As above  Donor site suture:  5-0 vicryl, 6-0 gut       FTSG suture:  6-0 gut  Graft length and width:  2.0 x 1.4 cm  Graft surface area:  2.8 cm2  Postoperative medications: None  EBL:  <5ccs  Complications:  None    Due to the size, depth, and location, apposition could not be obtained without significant tension and distortion of adjacent tissue and structures. We discussed repair options at length including graft, flap, and second intention. We chose to proceed with a graft.  Therefore, a template/measurement of the defect was made to pattern the excision in the donor site that was then aseptically prepped, anesthetized, and excised.  The donor defect was then undermined in all directions and closed so that the subcutaneous and epidermal tissue could be apposed without significant tension.  Hemostasis was achieved with electrocautery.  The graft was then defatted, trimmed,  and placed on the recipient site.  The circumference of the graft was secured with the above suture.  Internal bolstering stitches were placed as needed.  The surgical site was then lightly scrubbed with sterile, saline-soaked gauze.  The area was then bandaged using Vaseline ointment, non-adherent gauze, gauze pads, and tape to provide an adequate pressure dressing.   The patient tolerated the procedure well, was given detailed written and verbal wound care instructions, and was discharged in good condition.  Sun protection and sun avoidance was also discussed. The patient will follow-up in 1 week.

## 2023-12-15 NOTE — Unmapped (Signed)
 Patrick Carpen, MD    POST-OPERATIVE WOUND CARE INSTRUCTIONS    Take it easy for at least 1 week after your surgery.  Strenuous activity can cause bleeding and separation of your wound.  Avoid running, weight lifting and swimming during this time period.  Keep the wound elevated as much as possible; if the wound is on your face, try to sleep in a recliner or on an extra pillow (do NOT sleep face down).  Typical post-op pain consists of soreness or mild tenderness usually lasting less than 48 hours.  This is often relieved with extra-strength Tylenol every 8 hours.  Avoid aspirin, NSAIDS (Motrin, Aleve, Advil, etc.), or alcohol for at least the first week (these can cause bleeding).  If bleeding occurs, remove the wet bandage then apply firm pressure for 20 minutes (timed by the clock). You can also use an ice-pack to decrease the swelling and bleeding.  If it persists, apply pressure for one more round of 20 minutes.  If it continues, call our office or go to the nearest ER.    7 day bandage change:  Do not get the pressure bandage wet.  In 2-3 days, you can remove only the large outer bandage (all white).  If the bandage has become soaked with blood or has loosened before this time, you may replace it with a clean bandage.  Please call with any questions.  Usually, we will remove the inner flat bandage (all brown) at your follow-up in approximately 1 week (7 days) unless otherwise noted.  3.    After all bandaging is removed follow the cleaning instructions below.  *It is normal to have dried blood or a small amount of bleeding when the pressure bandage is removed.    3 day bandage change:  Remove all bandaging (both outer and inner) in 72 hours (3 days).  2.   After all bandages are removed follow the cleaning instructions below.  *It is normal to have dried blood or a small amount of bleeding when the bandage is removed.    Cleaning:  Do the following every day (TWICE A DAY) for at least 1 month (or until the wound is healed) after all bandages are removed:  Clean the surgical area with a white vinegar/distilled water solution (1-2 tbsp white vinegar* in 1 cup of distilled water).  Soak cotton balls/q-tips in this solution and saturate the wound for 2 minutes.  Use a soaked cotton ball or q-tip to gently remove any crust or drainage.  Gently pat dry.  Crusts and scabs slow down wound healing; gently remove as much as you can.  Apply Vaseline ointment or Polysporin (not Neosporin) to the wound and over any incision lines, stitches, and the surrounding area.  Be generous with the Vaseline and do not let the wound become dry and crusted. (Continue this for 1-2 months or until healed).  Cover with a bandage if needed, especially if you will be in a dirty or dusty environment (or if in direct sun); you may also want to cover before going to bed.    *We recommend using a new bottle of distilled water, a new bottle of white vinegar, and a new container of Vaseline to reduce risk of infection    Check the wound for extensive swelling, bleeding or redness.  It is normal to have some mild oozing, swelling and redness for a few days after surgery.  If the wound appears infected (increasing redness, significant warmth, extreme tenderness, thick yellow  discharge), call our office.    Important tips and points:  *The better your wound care, the better the final appearance of your surgical site and any resulting scar.  *Although your wound may appear healed in 1-2 weeks, it takes at least 3 months for complete healing and 12 months for it to take its final appearance.  *Numbness in the surgical area is expected, and it may take 12-18 months for the feeling to return to normal.  During this time sensations of itching, tingling, and occasional sharp pains might be noted.  These feelings are normal and will subside once the nerves in the skin have healed.  *By limiting the tension across your wound for 3 months, you will minimize any wide ???spread scar??? appearance.  Therefore, try to avoid any very strenuous exercise or heavy lifting for at least 1 month.  Wounds regain only 30% of their tensile strength at 1 month (and only 80% at one year).  *For wounds around the lips and mouth, avoid any wide opening???sorry, no biting into an apple or hamburger for a while.  Try to cut up your food and take little bites. If you floss, use flossing sticks instead of string to minimize the wide mouth opening.  *Avoid sun exposure to your wound and scar to prevent any discoloration or prolonged redness.  You may use sunscreen after it has healed with new skin (about 4-6 weeks).  Cover it up with Vaseline and a bandage until new skin has appeared.  *For FLAPS/GRAFTS: A skin flap or skin graft needs a good blood supply to survive.  Avoid any trauma, friction, or rubbing to the area which can disrupt new blood vessels.  Smoking can decrease the blood supply, so please do not smoke.  Keep the surgical site elevated by sleeping in a recliner or on extra pillows (do NOT sleep face-down).      Please call the [facility] if you have any questions: [phone]  (If after-hours, you may call [phone] and ask for the dermatology resident physician on-call)              -----SUNSCREEN INFORMATION-----    What should I look for in a sunscreen?  Blocks both UVA and UVB rays (Broad Spectrum)  Sunscreen of at least SPF 30 or higher   Physical blockers with titanium dioxide or zinc oxide are great at blocking UV rays and are less likely to cause a skin allergy.  Water resistant for up to either 40 or 80 minutes. Sunscreens are not waterproof or sweat proof.  Some recommended brands include, but are not limited to: Neutrogena, Vanicream, Blue [place], [place], [place], Cetaphil    When should I use my sunscreen?  Apply sunscreen every day if you will be outside for longer than 10-15 minutes.   UVA rays (cause aging and skin cancer) can penetrate through clouds and windows, including car windows.   We recommend using a lotion with broad spectrum SPF 30 on a daily basis to the face.     How do I use my sunscreen?  Apply sunscreen liberally. A good rule of thumb is to use one ounce (enough to fill a shot glass) for the body. This may change depending on your body size though.  Apply 15 minutes before going outside.  Reapply sunscreen at least every 2 hours, especially after swimming or heavy sweating. No sunscreens are sweat proof or waterproof.   Remember to protect your lips with a lip  balm that contains sunscreen of at least SPF 30.    Are there options besides sunscreen?  Of course!  Avoidance measures:  Avoid peak hours between 10 am to 4 pm, when the UV rays are the strongest.  Limit exposure to reflective surfaces such as water, sand, and snow as UV rays can be reflected from these surfaces.  Protective measures:  Wear a wide-brimmed hat with at least 3 inch brim.  Sun protective clothing is an excellent option, with clothing, the SPF is called UPF. Look for a UPF of 30 to 50+.  Recommended brands include: Lenna Gilford, and Sunday Afternoons.   Check out sporting goods stores such as [employer] and [employer] as well.     Is there a safe way to tan?  Tanning booths are NOT safer than sun exposure. They damage skin just as real sunlight does and lead to wrinkling and skin cancer.  Consider sunless tanning products such as Jergens or L???Oreal. Remember the tan produced by these products does not actually protect you from the sun so continue to apply your sunscreen regularly.

## 2023-12-16 MED ORDER — OXYCODONE 5 MG TABLET
ORAL_TABLET | Freq: Four times a day (QID) | ORAL | 0 refills | 14.00 days | Status: CP | PRN
Start: 2023-12-16 — End: ?

## 2023-12-16 NOTE — Unmapped (Signed)
 Refill requested. PDMP reviewed and appropriate. Refill sent to preferred pharmacy.

## 2023-12-17 MED FILL — ABIRATERONE 250 MG TABLET: ORAL | 30 days supply | Qty: 120 | Fill #3

## 2023-12-22 ENCOUNTER — Ambulatory Visit
Admit: 2023-12-22 | Discharge: 2023-12-23 | Payer: PRIVATE HEALTH INSURANCE | Attending: MOHS-Micrographic Surgery | Primary: MOHS-Micrographic Surgery

## 2023-12-28 NOTE — Unmapped (Signed)
1 Week Mohs Surgery Follow Up    Site: Left Ear  Repair: FTSG  Follow Up: As Needed    Additional Notes: Patient is healing well with no questions or concerns. Photos were taken, site(s) re-bandaged(s), further wound care instruction was provided and follow up with a general dermatologist was recommended.

## 2024-01-12 NOTE — Unmapped (Signed)
[facility] Refill Coordination Note    Specialty Medication(s) to be Shipped:   Hematology/Oncology: abiraterone 250mg     Other medication(s) to be shipped: No additional medications requested for fill at this time     Kylie Gros, DOB: 07/10/1961  Phone: There are no phone numbers on file.      All above HIPAA information was verified with patient.     Was a Nurse, learning disability used for this call? No    Completed refill call assessment today to schedule patient's medication shipment from the [facility]  [phone]).  All relevant notes have been reviewed.     Specialty medication(s) and dose(s) confirmed: Regimen is correct and unchanged.   Changes to medications: Lavon reports no changes at this time.  Changes to insurance: No  New side effects reported not previously addressed with a pharmacist or physician: None reported  Questions for the pharmacist: No    Confirmed patient received a Conservation officer, historic buildings and a Surveyor, mining with first shipment. The patient will receive a drug information handout for each medication shipped and additional FDA Medication Guides as required.       DISEASE/MEDICATION-SPECIFIC INFORMATION        N/A    SPECIALTY MEDICATION ADHERENCE     Medication Adherence    Patient reported X missed doses in the last month: 0  Specialty Medication: Abiraterone 250 mg  Patient is on additional specialty medications: No  Informant: patient     Were doses missed due to medication being on hold? No    Abiraterone 250 mg: 10 days of medicine on hand       REFERRAL TO PHARMACIST     Referral to the pharmacist: Not needed      [facility]     Shipping address confirmed in Epic.       Delivery Scheduled: Yes, Expected medication delivery date: 01/18/24.     Medication will be delivered via Next Day Courier to the prescription address in Epic [place].    Caroll Weinheimer M Elisabeth Cara   [facility]  Specialty Technician

## 2024-01-13 NOTE — Unmapped (Signed)
 OUTPATIENT ONCOLOGY PALLIATIVE CARE CONSULT NOTE    Patrick Montgomery is a 63 y.o. male who is seen in consultation at the request of Patrick Montgomery* for evaluation of Symptoms     Principal Diagnosis: Patrick Montgomery is a 63 y.o. male with prostate cancer, diagnosed in June 2023 now on ADT with Eliguard.  Disease sites include prostate, ?solitary bone metastasis.     Assessment/Plan:     #Cancer-Related Pain: ONgoing, has not improved.  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 Has been hesitant about pudendal nerve block due to worries about losing all sensatation in his leg, but reports he is finally open to it given lack of improvement in pain.   Moderate to severe pain in the right perineal area. Unclear if cancer related although this does seem to correlate with CT finding of increased conspicuity of right inferior pubic ramus mixed lytic sclerotic lesion.  Unclear if pain is from this is disease versus radiation changes.  Now describing more of a neuropathic burning sensation with radiation down leg.   - s/p xtampza as patient felt unclear efficacy and then self-discontinued when he ran out  - Continue wean of oxycodone 5-10 mg q4 hrs PRN --> now no more than 4 tablets per day, # 112 tablets this month, will plan to go down to 5mg  tablets next month   -INcrease lyrica to 150mg  BID  - Discussed pudendal nerve block in detail again - patient open to this now and will message to anesthesia team to schedule  - Cont tylenol 1000 mg TID PRN  - Cont flexeril 10mg  nightly PRN  - Cont Lidocaine ointment to tender area of skin    Prior Medications:   - MS contin - didn't find effective.  Oxycodone 5 mg effective.  - Naproxen BID - mild benefit  - Tylenol - mild to no benefit    #Cancer-Related Fatigue: Stable. Still able to be active. Gets out of house daily.   - Encouraged light exercise as able    #Early Satiety: Stable, weight is stable..   - If worsens, patinet open to nutrition referral in future  - CTM, possible marinol trial in future if appetite becomes issue    #Constipation: Worse, but hasn't been taking bowel regimen.   - restart senna to 2 tablet nightly  - restart MiraLAX 1-2 times daily PRN  - Low threshold to escalate regimen vs. 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 Hx of skin cancer. Previously seen by [facility] derm.   - mngt per Derm    #Mood/Coping: Stable.  Good support from his wife, mother and children. Stays positive by nature. Says he would be perfect if not for lingering pain.   - Also shares some concerns about loss of libido - will discuss in more detail with onc team at next visit    #Goals of Care/Prognostic Awareness: Previously provided patient with prepare for your care paperwork and introduced the concepts of CODE STATUS and the purpose of advance directives.  Patient appreciated having something to take at home and complete with his partner. Hasn't returned this to clinic yet.   - Remains on board with current plan of care.  Glad to hear his PSA is undetectable      From prior convos:  - Knows he has prostate cancer and is followed by Patrick Montgomery.  Knows that recent PSA was good.  Understands that he is  taking medicine through 2026 for this.  Unsure if his disease is curable or not and what the overall trajectory of his prognosis is, but would like to know.  Is a type of person who would want to know details about his disease and prognosis going forward.  - Patient shares his understanding that he has metastatic prostate cancer. He does not recall much about what to expect from this cancer, including about prognosis. He knows that there are several treatment options, but is not sure whether it is curable or not.   - We asked whether he is the type of person who wants to know details about prognosis and he reports that he would like to know. He appreciates honest and direct communication. We said that we would discuss with oncology so that we can provide accurate communication.   - Finally, we asked who his preferred HCDM is and he says that it is his domestic partner, Patrick Montgomery. We discussed that she would not be default HCPOA by state law and recommended completing formal HCPOA paperwork to allow her to be his surrogate if needed in the future.     #Advance Care Planning:  HCDM:   HCDM (patient stated preference): Patrick Montgomery - Domestic Partner [phone]  Natural surrogate decision maker: majority of adult children and living parents  Advance Directive: none on file - wants to do HCPOA      #Controlled substances risk management.   - Patient has a signed pain medication agreement with Outpatient Palliative Care, completed on 01/28/23, as per standard of care.   - [place]CCSRS database was reviewed today and it was appropriate.   - Urine drug screen was performed at this visit. Findings: periodic screening has been appropriate.   - Patient has received information about safe storage and administration of medications.   - Patient has received a prescription for narcan and patient is not applicable.     F/u: 6-8 weeks    ----------------------------------------    Referring Provider: Ortencia Montgomery  Oncology Team: Patrick Montgomery  PCP: Patrick Lever, FNP    Interval Hx, 01/14/24:    Patient shares that he continues to have pain in the same location. It is persistent and always feels worse at night. He has struggled with reduction in oxycodone and shares that he is now more interested in pursuing pudendal nerve block.     No other acute concerns. No current dyspnea, nausea, vomiting, diarrhea, or peripheral neuropathy. Mood is good overall.      Appetite is good and weight is stable.     HPI: Patrick Montgomery is a 63 year old male with history of prostate cancer establishing with palliative care today primarily for pain control.    In terms of pain, patient locates the pain just to the right of his rectum, sometimes with radiation down his leg and up his back. He describes the pain as a sharp and excruciating pain. He notes that it is constant. It is worse after moving around all day and worse when sitting down.  It feels better if he massages the area.  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    Palliative Performance Scale: 60% - Ambulation: Reduced / unable to do hobby or some housework, significant disease / Self-Care:Occasional assist as necessary / Intake:Normal or reduced / Level of Conscious: Full or confusion    Social History:  Lives in [place], [place] with his domestic partner, Patrick Montgomery (not officially married). They have an 18yo daughter. Also has a 26yo son who runs a comic book store. Mother is living and also a source of support.    Watches church every Sunday - Patrick Montgomery. Also belongs to a [employer] church.     Occupation: Retired house painter  Hobbies: Likes to race go-karts and cars. Likes to watch his nephew do this as well.     Objective     Hematology/Oncology History Overview Note   In 03/2022, seen in ER for gross hematuria, worsening LUTS. CT bladder showing bladder mass.  In 04/2022, PSA 18. Had cysto, prostate biopsy, TURP. Prostate biopsy showed Gleason 4+5=9 (GG 5), 10/12 cores positive for cancer. TURP specimen showed mixed acinar/ductal features, cribriborm morphology.  In 05/2022, CT and bone scan showed no pelvic or distant mets  In 06/2022, ADT started with Degarelix, continued with Eligard.  In 06/2022, PSMA PET showed avidity in R inferior pubic ramus, suspicious for single bone met.  In 07/2022, abiraterone/prednisone started.  In 08/2022, RT to prostate/SV/nodes and pubic ramus bone met site, completed in 12/2022.  In 05/2023, abiraterone held due to hypertension and hypokalemia. Resumed after correction     Prostate cancer (CMS-HCC)   06/17/2022 Initial Diagnosis    Prostate cancer (CMS-HCC)     06/17/2022 -  Cancer Staged    Staging form: Prostate, AJCC 8th Edition  - Clinical: Stage IIIC (cT4, cN0, cM0, PSA: 18, Grade Group: 5) - Signed by Young E Whang, MD on 06/19/2022       07/07/2022 - 07/07/2022 Endocrine/Hormone Therapy    OP DEGARELIX  Plan Provider: Young E Whang, MD     08/11/2022 Endocrine/Hormone Therapy    OP PROSTATE LEUPROLIDE (ELIGARD) 45 MG EVERY 6 MONTHS  Plan Provider: Young E Whang, MD     10 /10/2022 - 10/02/2023 Radiation    Radiation Therapy Treatment Details (09/10/2022 - 10/02/2023)  Site: Prostate  Technique: IMRT  Goal: No goal specified  Planned Treatment Start Date: No planned start date specified       REVIEW OF SYSTEMS:  A comprehensive review of 10 systems was negative except for pertinent positives noted in HPI.    PHYSICAL EXAM:   Phone VIsit        The patient reports they are physically located in [place] and is currently: at home. I conducted a phone visit.  I spent 15 minutes on the phone call with the patient on the date of service .       Redgie Grayer, MD, MEd  [facility] Palliative [facility]

## 2024-01-14 ENCOUNTER — Encounter
Admit: 2024-01-14 | Discharge: 2024-01-15 | Payer: PRIVATE HEALTH INSURANCE | Attending: Student in an Organized Health Care Education/Training Program | Primary: Student in an Organized Health Care Education/Training Program

## 2024-01-14 DIAGNOSIS — G620 Drug-induced polyneuropathy: Principal | ICD-10-CM

## 2024-01-14 DIAGNOSIS — G588 Other specified mononeuropathies: Principal | ICD-10-CM

## 2024-01-14 DIAGNOSIS — K5903 Drug induced constipation: Principal | ICD-10-CM

## 2024-01-14 DIAGNOSIS — G893 Neoplasm related pain (acute) (chronic): Principal | ICD-10-CM

## 2024-01-14 DIAGNOSIS — C61 Malignant neoplasm of prostate: Principal | ICD-10-CM

## 2024-01-14 DIAGNOSIS — T451X5A Adverse effect of antineoplastic and immunosuppressive drugs, initial encounter: Principal | ICD-10-CM

## 2024-01-14 DIAGNOSIS — Z515 Encounter for palliative care: Principal | ICD-10-CM

## 2024-01-14 DIAGNOSIS — L989 Disorder of the skin and subcutaneous tissue, unspecified: Principal | ICD-10-CM

## 2024-01-14 MED ORDER — POLYETHYLENE GLYCOL 3350 17 GRAM ORAL POWDER PACKET
PACK | Freq: Every day | ORAL | 1 refills | 60.00 days | Status: CP
Start: 2024-01-14 — End: ?

## 2024-01-14 MED ORDER — OXYCODONE 5 MG TABLET
ORAL_TABLET | Freq: Four times a day (QID) | ORAL | 0 refills | 14.00 days | Status: CP | PRN
Start: 2024-01-14 — End: ?

## 2024-01-14 MED ORDER — PREGABALIN 150 MG CAPSULE
ORAL_CAPSULE | Freq: Two times a day (BID) | ORAL | 2 refills | 30.00 days | Status: CP
Start: 2024-01-14 — End: 2025-01-13

## 2024-01-14 MED ORDER — SENNOSIDES 8.6 MG TABLET
ORAL_TABLET | Freq: Every evening | ORAL | 2 refills | 30.00 days | Status: CP
Start: 2024-01-14 — End: 2025-01-13

## 2024-01-17 MED FILL — ABIRATERONE 250 MG TABLET: ORAL | 30 days supply | Qty: 120 | Fill #4

## 2024-01-17 NOTE — Unmapped (Signed)
 Patrick Montgomery 's abiraterone 250 mg tablet (ZYTIGA) shipment will be delayed as a result of no credit card on file to collect copayment.     I have reached out to the patient  at [phone]  and communicated the delay. We will wait for a call back from the patient to reschedule the delivery.  We have not confirmed the new delivery date.

## 2024-01-29 DIAGNOSIS — E876 Hypokalemia: Principal | ICD-10-CM

## 2024-01-29 MED ORDER — POTASSIUM CHLORIDE ER 10 MEQ TABLET,EXTENDED RELEASE
ORAL_TABLET | Freq: Every day | ORAL | 1 refills | 0.00 days
Start: 2024-01-29 — End: ?

## 2024-01-31 MED ORDER — POTASSIUM CHLORIDE ER 10 MEQ TABLET,EXTENDED RELEASE
ORAL_TABLET | Freq: Every day | ORAL | 1 refills | 90.00 days | Status: CP
Start: 2024-01-31 — End: ?

## 2024-01-31 NOTE — Unmapped (Signed)
 POST PROCEDURE INSTRUCTIONS   You may apply an ice pack 20-30 minutes at a time to the injection site if you experience soreness.     Keep the injection site clean and dry. You make remove the band-aid one day following the procedure.     You may take a shower but AVOID getting in to baths, pools or whirlpools for 48 HOURS AFTER THE PROCEDURE.     Remember that it takes a few days, even up to a week for the steroid medicine to reduce inflammation and pain. If you are diabetic, the steroid medicine may cause your blood sugar levels to increase. Please contact your internist regarding medication adjustments.    ACTIVITY   Refrain from heavy activity for the next 24 to 48 hours. General walking is okay. You may resume your normal activities the day following the procedure.   You may start or resume your individualized exercise program or physical therapy 48 hours after the procedure.   MEDICATIONS   Please note that it is okay to continue other prescribed medications (blood pressure, insulin, water pill, depression/anxiety pill, etc.) as well as other prescribed pain medications such as Neurontin, Lyrical, Celebrex, Ultram, Vicodin, Norco and acetaminophen (Tylenol).   SIDE EFFECTS   Increase in pain during the first 24 to 48 hours.     Trouble sleeping during the first one or two nights after the procedure-this is an effect of the steroid medicine.     Facial and or chest flushing (it feels like you have a fever, but you do not) - this is an effect of the steroid medicine.   May have a headache      You might experience:   1. Mild to moderate swelling at the joint.   2. Possible bruising at the injection site.     WHEN TO CALL THE DOCTOR/NURSE   Severe pain, worse or different that the pain you had before the procedure.     Fever or chills.     Redness, or swelling around the injection site.     Call the Pain Management  Procedural Nurses [phone] during normal business hours (7 am-3 pm). If it is AFTER HOURS or during a weekend or holiday, call the hospital operator and ask for the Anesthesia Pain physician on call at [phone].   FOR EMERGENCIES, CALL 911 OR GO TO THE NEAREST HOSPITAL EMERGENCY DEPARTMENT.   ?   TO SCHEDULE APPOINTMENTS OR FOR QUESTIONS RELATED TO MEDICATIONS   Call the Pain Management Clinic at [phone]

## 2024-01-31 NOTE — Unmapped (Signed)
 Pt is requesting refill    Most recent clinic visit: 11/16/2023  Next clinic visit:  02/15/2024

## 2024-01-31 NOTE — Unmapped (Signed)
 Called  and left voicemail:  Appt date: 02/02/2024  Appt time: 0800 AM advised to arrive 30 mins prior to appt.  Physician: Dr. Manson Passey  Location: [phone] [address]   Clinic phone number: [phone]  Driver? Yes  Advised to call clinic if experiencing COVID symptoms, signs of infection or recent use of antibiotics or steroids.

## 2024-02-01 NOTE — Unmapped (Addendum)
 PROCEDURE PLANNED: RIGHT Pudendal Nerve Injection under fluoroscopic guidance  PREOPERATIVE DIAGNOSIS: Pudendal Neuralgia  POST-PROCEDURE DIAGNOSIS: Same    PROCEDURE PERFORMED BY: Dr. Missy Sabins  ASSISTANT: Dr. Cloyde Reams  Anesthesia: Local    Patrick Montgomery is a 63 y.o. 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    Informed consent was obtained and potential risks discussed including, but not limited to: bleeding, bruising, severe allergic reaction to components of the injection materials, compression or damage of the surrounding nerves such as the sciatic nerve, infection (superficial, deep, abscess and meningitis), paralysis, inability to place the needle properly, damage to pelvic structures, the possibility of no benefit (pain relief) derived from the injection, or in rare occasions worsening of pain or denervation neuritis (if pulsed RFA done). Questions were answered to the patient's satisfaction and the patient wishes to proceed. Alternative options for treatment have previously been discussed and explored with the patient. The patient is not taking antiplatelet or anticoagulation medications and does  have a driver today.    PROCEDURE DESCRIPTION:  Prior to entry into the fluoroscopy room, the patient was marked on the approprite side. Once in the procedure room, the patient was laid in the prone position on the fluoroscopic table with pressure points padded. Appropriate monitors were applied and a timeout protocol was performed. A sterile prep with ChloraPrep was performed and a sterile drape was applied.    With the patient in prone position and all routine monitoring applied to include blood pressure and continuous pulse oximetry, site of injection was widely prepped with chlorhexadine and draped in sterile fashion. Meticulous aseptic technique was maintained. The anatomic position of theright ischial spine was identified under fluoroscopy. The skin and subcutaneous tissues were anesthetized with a small amount of 0.5% lidocaine. Using a 25 gauge 3.5  inch Quincke needle, the target point was approached under fluoroscopic guidance using 10 degrees of caudad angulation and 15 degrees of ipsilateral angulation.  Once needle made contact with the os, after negative aspiration for heme 4 mL of solution made of 3 mL 2% lidocaine and 10 mg dexamethasone (10mg /mL) was injected without any complications.  The needle was withdrawn without difficulty and the puncture site was cleansed and covered with a sterile bandage.    The patient did tolerate the procedure well and there were no apparent complications, though had hyperalgesia with skin localization. The patient did not complain of any new pain or numbness radiating to either lower extremity during needle placement or injection of medications, through the completion of the procedure.    All injection sites were sterilely dressed. A neurological assessment 15 minutes following the procedure was unchanged. The patient was discharged after an appropriate period of observation and post procedural education was given.     Total Fluoroscopic time  21  seconds.  Pre-procedure pain score was 9/10  Post-procedure pain score is 1/10    DISPO:  Follow-up with our clinic PRN for repeats (either Pulsed RFA if no durable relief or repeat injection with local and steroid no more than every 3 months if lasting relief)

## 2024-02-02 ENCOUNTER — Ambulatory Visit
Admit: 2024-02-02 | Discharge: 2024-02-03 | Payer: PRIVATE HEALTH INSURANCE | Attending: Student in an Organized Health Care Education/Training Program | Primary: Student in an Organized Health Care Education/Training Program

## 2024-02-02 DIAGNOSIS — G893 Neoplasm related pain (acute) (chronic): Principal | ICD-10-CM

## 2024-02-02 DIAGNOSIS — G588 Other specified mononeuropathies: Principal | ICD-10-CM

## 2024-02-02 MED ADMIN — iohexol (OMNIPAQUE) 180 mg iodine/mL solution 2 mL: 2 mL | @ 13:00:00 | Stop: 2024-02-02

## 2024-02-02 MED ADMIN — triamcinolone acetonide (KENALOG-40) injection 40 mg: 40 mg | @ 13:00:00 | Stop: 2024-02-02

## 2024-02-02 MED ADMIN — lidocaine (XYLOCAINE) 5 mg/mL (0.5 %) injection 50 mL: 50 mL | @ 13:00:00 | Stop: 2024-02-02

## 2024-02-02 MED ADMIN — lidocaine (XYLOCAINE) 20 mg/mL (2 %) injection 10 mL: 10 mL | @ 13:00:00 | Stop: 2024-02-02

## 2024-02-02 NOTE — Unmapped (Signed)
 Driver present. AVS printed and reviewed with patient. Patient voiced understanding.

## 2024-02-10 DIAGNOSIS — C61 Malignant neoplasm of prostate: Principal | ICD-10-CM

## 2024-02-10 MED ORDER — OXYCODONE 5 MG TABLET
ORAL_TABLET | Freq: Four times a day (QID) | ORAL | 0 refills | 14.00 days | PRN
Start: 2024-02-10 — End: ?

## 2024-02-11 MED ORDER — OXYCODONE 5 MG TABLET
ORAL_TABLET | Freq: Four times a day (QID) | ORAL | 0 refills | 14.00 days | Status: CP | PRN
Start: 2024-02-11 — End: ?

## 2024-02-11 NOTE — Unmapped (Signed)
 Refill requested. PDMP reviewed and appropriate. Refill sent to preferred pharmacy.

## 2024-02-11 NOTE — Unmapped (Signed)
 [facility] Clinical Assessment & Refill Coordination Note    Patrick Montgomery, [name]: 12/21/1960  Phone: There are no phone numbers on file.    All above HIPAA information was verified with patient.     Was a Nurse, learning disability used for this call? No    Specialty Medication(s):   Hematology/Oncology: abiraterone 250 mg     Current Outpatient Medications   Medication Sig Dispense Refill    abiraterone (ZYTIGA) 250 mg tablet Take 4 tablets (1,000 mg total) by mouth daily. 120 tablet 11    amlodipine (NORVASC) 10 MG tablet Take 1 tablet (10 mg total) by mouth daily. 30 tablet 11    aspirin (ECOTRIN) 81 MG tablet Take 1 tablet (81 mg total) by mouth daily. 90 tablet 3    atorvastatin (LIPITOR) 10 MG tablet Take 1 tablet (10 mg total) by mouth daily. 90 tablet 0    naloxone (NARCAN) 4 mg nasal spray One spray in either nostril once for known/suspected opioid overdose. May repeat every 2-3 minutes in alternating nostril til EMS arrives 2 each 3    oxyCODONE (ROXICODONE) 5 MG immediate release tablet Take 1-2 tablets (5-10 mg total) by mouth every six (6) hours as needed for pain. No more than 4 tablets per day. 112 tablet 0    polyethylene glycol (MIRALAX) 17 gram packet Take 17 g by mouth daily. 60 packet 1    potassium chloride (KLOR-CON) 10 MEQ CR tablet TAKE 1 TABLET BY MOUTH EVERY DAY 90 tablet 1    predniSONE (DELTASONE) 5 MG tablet Take 1 tablet (5 mg total) by mouth daily. 30 tablet 11    pregabalin (LYRICA) 150 MG capsule Take 1 capsule (150 mg total) by mouth two (2) times a day. 60 capsule 2    senna (SENOKOT) 8.6 mg tablet Take 1-2 tablets by mouth nightly. 60 tablet 2     No current facility-administered medications for this visit.        Changes to medications: Patrick Montgomery reports no changes at this time.    Medication list has been reviewed and updated in Epic: Yes    No Known Allergies    Changes to allergies: No    Allergies have been reviewed and updated in Epic: Yes    SPECIALTY MEDICATION ADHERENCE     abiraterone 250 mg: unsure exact doses of medicine on hand -denies missed doses    Medication Adherence    Patient reported X missed doses in the last month: 0  Specialty Medication: abiraterone 1000 mg once daily  Patient is on additional specialty medications: No  Informant: patient  Confirmed plan for next specialty medication refill: delivery by pharmacy  Refills needed for supportive medications: not needed          Specialty medication(s) dose(s) confirmed: Regimen is correct and unchanged.     Are there any concerns with adherence? No    Adherence counseling provided? Not needed    CLINICAL MANAGEMENT AND INTERVENTION      Clinical Benefit Assessment:    Do you feel the medicine is effective or helping your condition? Yes    Clinical Benefit counseling provided? Not needed    Adverse Effects Assessment:    Are you experiencing any side effects? No    Are you experiencing difficulty administering your medicine? No    Quality of Life Assessment:    Quality of Life    Rheumatology  Oncology  Dermatology  Cystic Fibrosis  How many days over the past month did your prostate cancer  keep you from your normal activities? For example, brushing your teeth or getting up in the morning. 0    Have you discussed this with your provider? Not needed    Acute Infection Status:    Acute infections noted within Epic:  No active infections    Patient reported infection: None    Therapy Appropriateness:    Is therapy appropriate based on current medication list, adverse reactions, adherence, clinical benefit and progress toward achieving therapeutic goals? Yes, therapy is appropriate and should be continued     Clinical Intervention:    Was an intervention completed as part of this clinical assessment? No    DISEASE/MEDICATION-SPECIFIC INFORMATION      N/A    Oncology: Is the patient receiving adequate infection prevention treatment? Not applicable  Does the patient have adequate nutritional support? Not applicable    PATIENT SPECIFIC NEEDS     Does the patient have any physical, cognitive, or cultural barriers? No    Is the patient high risk? No    Does the patient require physician intervention or other additional services (i.e., nutrition, smoking cessation, social work)? No    Does the patient have an additional or emergency contact listed in their chart? Yes    SOCIAL DETERMINANTS OF HEALTH     At the [facility], we have learned that life circumstances - like trouble affording food, housing, utilities, or transportation can affect the health of many of our patients.   That is why we wanted to ask: are you currently experiencing any life circumstances that are negatively impacting your health and/or quality of life? Patient declined to answer    Social Drivers of Health     Food Insecurity: No Food Insecurity (05/06/2022)    Hunger Vital Sign     Worried About Running Out of Food in the Last Year: Never true     Ran Out of Food in the Last Year: Never true   Tobacco Use: High Risk (12/15/2023)    Patient History     Smoking Tobacco Use: Every Day     Smokeless Tobacco Use: Never     Passive Exposure: Not on file   Transportation Needs: No Transportation Needs (05/06/2022)    PRAPARE - Transportation     Lack of Transportation (Medical): No     Lack of Transportation (Non-Medical): No   Alcohol Use: Not on file   Housing: Not on file   Physical Activity: Not on file   Utilities: Not on file   Stress: Not on file   Interpersonal Safety: Not At Risk (05/06/2022)    Interpersonal Safety     Unsafe Where You Currently Live: No     Physically Hurt by Anyone: No     Abused by Anyone: No   Substance Use: Not on file (10/10/2023)   Intimate Partner Violence: Not At Risk (05/06/2022)    Humiliation, Afraid, Rape, and Kick questionnaire     Fear of Current or Ex-Partner: No     Emotionally Abused: No     Physically Abused: No     Sexually Abused: No   Social Connections: Not on file   Financial Resource Strain: Low Risk (05/06/2022)    Overall Financial Resource Strain (CARDIA)     Difficulty of Paying Living Expenses: Not hard at all   Depression: Not at risk (06/22/2022)    PHQ-2     PHQ-2 Score: 0  Internet Connectivity: No Internet connectivity concern identified (05/06/2022)    Internet Connectivity     Do you have access to internet services: Yes     How do you connect to the internet: Personal Device at home     Is your internet connection strong enough for you to watch video on your device without major problems?: Yes     Do you have enough data to get through the month?: Yes     Does at least one of the devices have a camera that you can use for video chat?: Yes   Health Literacy: Not on file       Would you be willing to receive help with any of the needs that you have identified today? Not applicable       SHIPPING     Specialty Medication(s) to be Shipped:   Hematology/Oncology: abiraterone 250 mg    Other medication(s) to be shipped: No additional medications requested for fill at this time     Changes to insurance: No    Cost and Payment:  $4 copay, patient reported unable to pay copayment    Delivery Scheduled: Yes, Expected medication delivery date: 02/16/24.     Medication will be delivered via Next Day Courier to the confirmed prescription address in [facility].    The patient will receive a drug information handout for each medication shipped and additional FDA Medication Guides as required.  Verified that patient has previously received a Conservation officer, historic buildings and a Surveyor, mining.    The patient or caregiver noted above participated in the development of this care plan and knows that they can request review of or adjustments to the care plan at any time.      All of the patient's questions and concerns have been addressed.    Kermit Balo, [facility]   [facility] Specialty Pharmacist

## 2024-02-14 NOTE — Unmapped (Signed)
 [facility]cology Visit Note    Patient Name: Patrick Montgomery  Patient Age: 63 y.o.  Encounter Date: 02/15/2024  Attending Provider:  Young E. 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    Patrick Montgomery is a 63 y.o. male who presents for management of newly diagnosed prostate cancer. He was initially seen in the ER In May 2023 for gross hematuria and  worsening LUTS. A CT scan of the bladder revealed a bladder mass. In June 2023, he was found to have a PSA of 18. He underwent cystoscopy, prostate biopsy, and TURP. Prostate biopsy showed Gleason 4+5=9 (GG 5), 10/12 cores positive for cancer. TURP specimen showed mixed acinar/ductal features, cribriborm morphology. In July 2023, CT and bone scan showed no pelvic or distant metastases.    On 06/18/22, we discussed management of his recent diagnosis of prostate cancer. Given that he has been diagnosed with prostate cancer at a very young age, I informed the patient that there may be features that indicate a higher risk of hereditary prostate cancer. 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   Based on current imaging, his cancer is confined to the prostate. A PSMA PET scan will be ordered to see if the cancer has metastasized.     On 07/07/2022, pt is now ready to move ahead with treatment. Will proceed with ADT plus abiraterone for two years, per STAMP[facility]E.  PSMA PET still undergoing authorization.    On 08/11/2022, most recent PSA on 08/01/2022 is 0.27. PSMA PET scan from 07/27/2022 showed avidity involving the right inferior pubic ramus which is suspicious for single site of osseous metastatic disease. Will proceed with Eligard shot and proceed with radiation with Dr. Carles Collet. May consider radiating single site of metastatic disease at the right inferior pubic ramus     On 12/03/2022, PSA <0.04, on ADT plus abi, s/p RT to all involved sites. For pain, unlikely to be from cancer, may benefit from pain clinic management long-term.    On 02/16/23, PSA remains <0.04. Received ADT today and will continue abiraterone/prednisone. BP mildly elevated at 146/81, will monitor. Chronic pain being addressed with support from palliative care team. Will RTC in 3 months.    On 05/18/2023, PSA remains undetectable. BP elevated at 166/72, potassium 3.2 today. Hold abiraterone for now, will have CPP Katina Degree, Pharm D follow up on these and restart abi (with or without dose reduction).     On 08/18/23, PSA remains undetectable. His BP improved from last visit at 151/74, K 4.4 and still remains on potassium supplement. He remains off of abiraterone. Has not checked BP at home. He is due for Eligard today. Given elevated BP, will increase amlodipine to 10 mg for BP management and restart abiraterone and continue  prednisone.    On 11/16/2023, PSA remains undetectable. Continue current course of treatment.     Today, on 02/15/2024, PSA remains undetectable. Re-discussed importance of taking Prednisone while on Abiraterone. Continue current course of treatment.      Plan:  Continue ADT with Eligard last given on 02/15/2024. Anticipate 3 years of therapy (till 8-07/2025)  --Follow PSA  Continue Abiraterone 1000 mg daily and continue prednisone 5 mg daily x 2 years (Until ~07/2024)  Potassium supplement continued  Follow labs/BP  Instructed patient to bring log of BP readings at home at next follow up  3. NGS returned without actionable mutations, germline testing negative on [facility] normal  4. Follow up with palliative care as scheduled  4. RTC in 3 months      Reason for Visit  Follow up of prostate cancer.     History of Present Illness:  Hematology/Oncology History Overview Note   In 03/2022, seen in ER for gross hematuria, worsening LUTS. CT bladder showing bladder mass.  In 04/2022, PSA 18. Had cysto, prostate biopsy, TURP. Prostate biopsy showed Gleason 4+5=9 (GG 5), 10/12 cores positive for cancer. TURP specimen showed mixed acinar/ductal features, cribriborm morphology.  In 05/2022, CT and bone scan showed no pelvic or distant mets  In 06/2022, ADT started with Degarelix, continued with Eligard.  In 06/2022, PSMA PET showed avidity in R inferior pubic ramus, suspicious for single bone met.  In 07/2022, abiraterone/prednisone started.  In 08/2022, RT to prostate/SV/nodes and pubic ramus bone met site, completed in 12/2022.  In 05/2023, abiraterone held due to hypertension and hypokalemia. Resumed after correction     Prostate cancer (CMS-HCC)   06/17/2022 Initial Diagnosis    Prostate cancer (CMS-HCC)     06/17/2022 -  Cancer Staged    Staging form: Prostate, AJCC 8th Edition  - Clinical: Stage IIIC (cT4, cN0, cM0, PSA: 18, Grade Group: 5) - Signed by Maurie Boettcher, MD on 06/19/2022       07/07/2022 - 07/07/2022 Endocrine/Hormone Therapy    OP DEGARELIX  Plan Provider: Maurie Boettcher, MD     08/11/2022 Endocrine/Hormone Therapy    OP PROSTATE LEUPROLIDE (ELIGARD) 45 MG EVERY 6 MONTHS  Plan Provider: Maurie Boettcher, MD     09/10/2022 - 10/02/2023 Radiation    Radiation Therapy Treatment Details (09/10/2022 - 10/02/2023)  Site: Prostate  Technique: IMRT  Goal: No goal specified  Planned Treatment Start Date: No planned start date specified         Interval history  The patient returns for scheduled follow up, unaccompanied. He reports stable bone pain, moderately controlled with pain medication. Hot flashes have resolved.  Eating well. No new symptoms.  12/2023 Mohs micrographic surgery of the left ear basal cell carcinoma  01/14/2024 Palliative care evaluation  02/02/2024 Pudendal Nerve Injection under fluoroscopic guidance    Allergies:  No Known Allergies    Current Medications:    Current Outpatient Medications:     abiraterone (ZYTIGA) 250 mg tablet, Take 4 tablets (1,000 mg total) by mouth daily., Disp: 120 tablet, Rfl: 11    amlodipine (NORVASC) 10 MG tablet, Take 1 tablet (10 mg total) by mouth daily., Disp: 30 tablet, Rfl: 11    aspirin (ECOTRIN) 81 MG tablet, Take 1 tablet (81 mg total) by mouth daily., Disp: 90 tablet, Rfl: 3    atorvastatin (LIPITOR) 10 MG tablet, Take 1 tablet (10 mg total) by mouth daily., Disp: 90 tablet, Rfl: 0    naloxone (NARCAN) 4  mg nasal spray, One spray in either nostril once for known/suspected opioid overdose. May repeat every 2-3 minutes in alternating nostril til EMS arrives, Disp: 2 each, Rfl: 3    oxyCODONE (ROXICODONE) 5 MG immediate release tablet, Take 1-2 tablets (5-10 mg total) by mouth every six (6) hours as needed for pain. No more than 4 tablets per day., Disp: 112 tablet, Rfl: 0    polyethylene glycol (MIRALAX) 17 gram packet, Take 17 g by mouth daily., Disp: 60 packet, Rfl: 1    potassium chloride (KLOR-CON) 10 MEQ CR tablet, TAKE 1 TABLET BY MOUTH EVERY DAY, Disp: 90 tablet, Rfl: 1    pregabalin (LYRICA) 150 MG capsule, Take 1 capsule (150 mg total) by mouth two (2) times a day., Disp: 60 capsule, Rfl: 2    predniSONE (DELTASONE) 5 MG tablet, Take 1 tablet (5 mg total) by mouth daily., Disp: 30 tablet, Rfl: 11    senna (SENOKOT) 8.6 mg tablet, Take 1-2 tablets by mouth nightly., Disp: 60 tablet, Rfl: 2    Past Medical History and Social History  Past Medical History:   Diagnosis Date    Anxiety     Arthritis     Basal cell carcinoma     Cancer (CMS-HCC)     skin    Gout     Kidney stones     Squamous cell skin cancer     Stroke (CMS-HCC)       Past Surgical History:   Procedure Laterality Date PR BIOPSY OF PROSTATE,NEEDLE/PUNCH N/A 05/21/2022    Procedure: IMAGE GUIDED BIOPSY, PROSTATE; NEEDLE OR PUNCH, SINGLE OR MULTIPLE, ANY APPROACH;  Surgeon: Phillips Grout, MD;  Location: [facility] OR [facility];  Service: Urology    PR TRANSURETHRAL ELEC-SURG PROSTATECTOM N/A 05/21/2022    Procedure: TRANSURETHRAL ELECTROSURGICAL RESECT PROSTATE, W/CONTORL POSTOP BLEED, COMPLETE (VASECT, MEATOT, CYSTO ETC);  Surgeon: Phillips Grout, MD;  Location: [facility] OR [facility];  Service: Urology    SKIN BIOPSY      skin cancer removal           Social History     Occupational History    Not on file   Tobacco Use    Smoking status: Every Day     Current packs/day: 1.50     Average packs/day: 1.5 packs/day for 45.0 years (67.5 ttl pk-yrs)     Types: Cigarettes    Smokeless tobacco: Never   Vaping Use    Vaping status: Never Used   Substance and Sexual Activity    Alcohol use: No     Comment: socially     Drug use: Yes     Types: Marijuana    Sexual activity: Not Currently     Partners: Female     Birth control/protection: None       Family History  Family History   Problem Relation Age of Onset    Cancer Father         Skin    Melanoma Father     Squamous cell carcinoma Father     Rashes / Skin problems Father     Cancer Brother         skin     Substance Abuse Disorder Neg Hx     Basal cell carcinoma Neg Hx      Prostate Cancer Family History Assessment:  History of cancer in children (yes/no; if yes, what type AND age of diagnosis): No  Total number of siblings (including deceased): 2  History of cancer in siblings (yes/no; if yes, provide relation, type of cancer, AND age of diagnosis): Brother superficial basal cell; age unknown  History of cancer in parents (yes/no; if yes, please specify parent, type of cancer, AND age of diagnosis): Father superficial basal cell carcinoma; age unknown  History of cancer in aunts/uncles/grandparents (yes/no; if yes, provide relation, type of cancer, AND age of diagnosis): No      Review of Systems:  A comprehensive review of 10 systems was negative except for pertinent positives noted in HPI.    Physical Exam:    VITAL SIGNS:  BP 135/77  - Pulse 55  - Temp 36.4 ??C (97.5 ??F) (Temporal)  - Resp 16  - Ht 167.6 cm (5' 6)  - Wt 61.2 kg (134 lb 14.4 oz)  - SpO2 100%  - BMI 21.77 kg/m??   ECOG Performance Status: 1  GENERAL: Well-developed, well-nourished patient in no acute distress.  HEAD: Normocephalic and atraumatic.  EYES: Conjunctivae are normal. No scleral icterus.  MOUTH/THROAT: Oropharynx is clear and moist.  No mucosal lesions.  NECK: Supple, no thyromegaly.  LYMPHATICS: No palpable cervical, supraclavicular, or axillary adenopathy.  CARDIOVASCULAR: Normal rate, regular rhythm and normal heart sounds.  Exam reveals no gallop and no friction rub.  No murmur heard.  PULMONARY/CHEST: Effort normal and breath sounds normal. No respiratory distress.  ABDOMINAL:  Soft. There is no distension. There is no tenderness. There is no rebound and no guarding.  MUSCULOSKELETAL: No clubbing, cyanosis, or lower extremity edema.  PSYCHIATRIC: Alert and oriented.  Normal mood and affect.  NEUROLOGIC: No focal motor deficit. Normal gait.  SKIN: Skin is warm, dry, and intact.      Results/Orders:    Lab on 02/15/2024   Component Date Value Ref Range Status    PSA 02/15/2024 <0.04  0.00 - 4.00 ng/mL Final    Albumin 02/15/2024 3.8  3.4 - 5.0 g/dL Final    Total Protein 02/15/2024 6.5  5.7 - 8.2 g/dL Final    Total Bilirubin 02/15/2024 0.4  0.3 - 1.2 mg/dL Final    Bilirubin, Direct 02/15/2024 0.10  0.00 - 0.30 mg/dL Final    AST 16/08/9603 10  <=34 U/L Final    ALT 02/15/2024 <7 (L)  10 - 49 U/L Final    Alkaline Phosphatase 02/15/2024 136 (H)  46 - 116 U/L Final    Potassium 02/15/2024 3.9  3.5 - 5.1 mmol/L Final       Lab Results   Component Value Date    PSA <0.04 02/15/2024    PSA <0.04 11/16/2023    PSA <0.04 09/15/2023    PSA <0.04 08/18/2023    PSA <0.04 05/18/2023    PSA <0.04 02/16/2023 PSA <0.04 01/07/2023    PSA <0.04 12/03/2022    PSA <0.04 10/30/2022    PSA <0.04 10/16/2022         Orders placed or performed in visit on 02/15/24    Clinic Appointment Request Physician (Eligard), Injection    LAB Appointment Request    Clinic Appointment Request Physician, Lab       Molecular Pathology  Tumor mutation profiling  [facility]: ZMYM3 mutation, TMB 3.7, MSS, no actionable mutations    Germline testing  Negative on [facility] normal draw    Pathology:  A: Prostate, right apex, core biopsy  - Prostatic adenocarcinoma, Gleason score 4 + 4 = 8 (Grade group 4) involving 2 of 2 cores, approximately 4 and 1 mm in linear extent, approximately  20% of total core length.      B: Prostate, left apex, core biopsy  - Prostatic adenocarcinoma, Gleason score 4 + 4 = 8 (Grade group 4) involving 2 of 2 cores, approximately 7 and 5 mm in linear extent, approximately 60% of total core length.   - Perineural invasion identified in this case.      C: Prostate, left mid, core biopsy  - Prostatic adenocarcinoma, Gleason score 4 + 5 = 9 (Grade group 5) involving 2 of 2 cores, approximately 6 and 3mm in linear extent, approximately 90% of total core length.      D: Prostate, right mid, core biopsy  - Prostatic adenocarcinoma, Gleason score 4 + 5 = 9 (Grade group 5) involving 2 of 2 cores, approximately 6 and 5 mm in linear extent, approximately 70% of total core length.      E: Prostate, right base, core biopsy  - Benign prostate tissue.      F: Prostate, left base, core biopsy  - Prostatic adenocarcinoma, Gleason score 4 + 4 = 8 (Grade group 4)  involving 2 of 2 cores, approximately 3 and 1 mm in linear extent, approximately 20% of total core length.      G: Bladder neck and bilateral median lobe, transurethral resection  - Involved by prostatic adenocarcinoma with mixed acinar and ductal features, Gleason score 4 + 4 = 8 (Grade group 4)    - Lymphovascular invasion identified     The pattern 4 in this case demonstrates cribriform morphology.      Imaging results:    CT Chest w Contrast 06/11/22:   No intrathoracic metastasis.       CT Abdomen Pelvis W Contrast 06/11/22:     Enlarged irregular prostate correlates with history of biopsy-proven prostate cancer. There is redemonstrated irregularity of the posterior bladder and increased irregularity of the prostate at the bladder margin compared to prior may be secondary to tumor versus postsurgical/post TURP change. Consider attention on follow-up versus further evaluation with PSMA PET scan.       Questionable sclerotic changes of the right inferior pubic ramus are indeterminate and may represent normal variation although metastatic osseous disease is not excluded. Attention on follow-up bone scan. There is no other evidence of metastatic disease in the abdomen/pelvis.       Additional chronic and incidental findings as described.       Please see same day CT chest report for detailed findings above the diaphragm     NM Bone Scan Whole Body 06/11/22  No findings suggestive of osseous metastatic disease     PET CT 07/27/2022:   Impression   1.Ill-defined tracer avidity associated with the prostate, left greater than right, likely representing biopsy-proven prostate carcinoma.   2.Moderate avidity involving the right inferior pubic ramus which is suspicious for single site of osseous metastatic disease. Otherwise, no definite evidence of additional metastatic disease. More specifically, no avid adenopathy. Subcentimeter pulmonary nodules which are below the resolution of PET/CT and require continued attention on follow-up.     CT CAP   Result Date: 12/15/2022  Impression:   --Mild rectosigmoid wall thickening without surrounding inflammatory changes, favored secondary to radiation changes, with additional imaging findings suggestive of slow bowel transit.   --Increased conspicuity of right inferior pubic ramus mixed lytic sclerotic lesion which may reflect posttreatment changes versus worsening site of disease. No new osseous metastases.     MRI Pelvis Wo Contrast  Result Date: 09/16/2023  Impression:  Poor visualization of the  pudendal and perineal nerves bilaterally.      There is however, extensive post radiation changes of the right-sided adductor and levator ani musculature surrounding the treated lesion within the right inferior pubic rami. These changes correspond to the expected location of anterior distribution of the above-mentioned nerves which indicate the possibility of of its involvement by post radiation damage or mass effect plus or minus scarring as a possible etiology of the patient's symptoms.       Seen and discussed with Dr. Griffin Basil    Angellica Maddison Lenord Fellers, MD   Hematology-Oncology Fellow

## 2024-02-14 NOTE — Unmapped (Signed)
 OUTPATIENT ONCOLOGY PALLIATIVE CARE CONSULT NOTE    Patrick Montgomery is a 63 y.o. male who is seen in consultation at the request of Patrick Montgomery* for evaluation of Symptoms     Principal Diagnosis: Patrick Montgomery is a 63 y.o. male with prostate cancer, diagnosed in June 2023 now on ADT with Eliguard.  Disease sites include prostate, ?solitary bone metastasis.     Assessment/Plan:     #Cancer-Related Pain; Pudendal Neuralgia: ONgoing.  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 Now s/p pudendal nerve block without significant improvement. Moderate to severe pain in the right perineal area persists.  Also describes a neuropathic burning sensation with radiation down leg.   - Message Dr. Manson Montgomery about nerve block efficacy and next steps.  - Resend Senna prescription for bowel regularity.  - Continue wean of oxycodone 5-10 mg q4 hrs PRN --> now no more than 4 tablets per day, # 112 tablets this month, will reassess next month  - Discussed buprenorphine patch if pain persists and oxycodone reduction is not feasible.  - Cont lyrica to 150mg  BID  - Cont tylenol 1000 mg TID PRN  - Cont flexeril 10mg  nightly PRN  - Cont Lidocaine ointment to tender area of skin    Prior Medications:   - MS contin - didn't find effective.  Oxycodone 5 mg effective.  - Naproxen BID - mild benefit  - Tylenol - mild to no benefit    #Cancer-Related Fatigue: Stable. Still able to be active. Gets out of house daily.   - Encouraged light exercise as able    #Early Satiety: Stable, weight is stable..   - If worsens, patinet open to nutrition referral in future  - CTM, possible marinol trial in future if appetite becomes issue    #Constipation:Ongoing. Intermittent constipation increases pudendal nerve pressure. Senna recommended for regularity.  - restart senna to 2 tablet nightly  - restart MiraLAX 1-2 times daily PRN    Prostate Cancer  Well-managed with undetectable PSA. On Zytiga, monitor for gastrointestinal side effects.  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 Hx of skin cancer. Previously seen by [facility] derm.   - mngt per Derm    #Mood/Coping: Stable.  Good support from his wife, mother and children. Stays positive by nature. Says he would be perfect if not for lingering pain.   - Also shares some concerns about loss of libido - will discuss in more detail with onc team at next visit    #Goals of Care/Prognostic Awareness: Previously provided patient with prepare for your care paperwork and introduced the concepts of CODE STATUS and the purpose of advance directives.  Patient appreciated having something to take at home and complete with his partner. Hasn't returned this to clinic yet.   - Remains on board with current plan of care.  Glad to hear his PSA is undetectable      From prior convos:  - Knows he has prostate cancer and is followed by Patrick Montgomery.  Knows that recent PSA was good.  Understands that he is taking medicine through 2026 for this.  Unsure if his disease is curable or not and what the overall trajectory of his prognosis is, but would like to know.  Is a type of person who would want to know details about his disease and prognosis going forward.  - Patient shares his understanding that he has metastatic prostate cancer. He  does not recall much about what to expect from this cancer, including about prognosis. He knows that there are several treatment options, but is not sure whether it is curable or not.   - We asked whether he is the type of person who wants to know details about prognosis and he reports that he would like to know. He appreciates honest and direct communication. We said that we would discuss with oncology so that we can provide accurate communication.   - Finally, we asked who his preferred HCDM is and he says that it is his domestic partner, Patrick Montgomery. We discussed that she would not be default HCPOA by state law and recommended completing formal HCPOA paperwork to allow her to be his surrogate if needed in the future.     #Advance Care Planning:  HCDM:   HCDM (patient stated preference): Patrick Montgomery - Domestic Partner [phone]  Natural surrogate decision maker: majority of adult children and living parents  Advance Directive: none on file - wants to do HCPOA      #Controlled substances risk management.   - Patient has a signed pain medication agreement with Outpatient Palliative Care, completed on 01/28/23, as per standard of care.   - [place] database was reviewed today and it was appropriate.   - Urine drug screen was performed at this visit. Findings: periodic screening has been appropriate.   - Patient has received information about safe storage and administration of medications.   - Patient has received a prescription for narcan and patient is not applicable.     F/u: 6-8 weeks    ----------------------------------------    Referring Provider: Ortencia Montgomery  Oncology Team: Patrick Montgomery  PCP: Patrick Lever, FNP    Interval Hx, 02/15/24:    History of Present Illness  Patrick Montgomery is a 63 year old male who presents with persistent pain despite recent right pudendal nerve block.    He underwent a right pudendal nerve block approximately two weeks ago, which has not significantly alleviated his pain. The pain is described as sharp and burning, located beside the rectum on the bottom of the right buttock. . Pain intensity is rated as nine or ten out of ten, worsening at night or after prolonged sitting. Movement provides some relief.    He continues to take oxycodone for pain management, which is the only thing helping him manage the pain currently. He is considering stopping the pain medication temporarily to assess the true level of pain without the influence of the medication.    He experiences constipation, which exacerbates his pain when his colon is full, causing increased pressure. He has not been taking any medication to aid bowel movements, such as Miralax or Senna, but bowel movements relieve the pain. He plans to resume Senna to help manage this issue.    No issues with appetite, nausea, or vomiting, and he has been eating well recently, consuming three meals a day. He has experienced some weight loss but is actively trying to gain weight back to his normal range. He also reports experiencing gas and stomach cramps, which he attributes to his chemotherapy medication, Zytiga.    His energy levels and sleep have improved recently, and he has been sleeping well over the past week or two. No trouble with breathing and his mood remains stable.      HPI: Patrick Montgomery is a 63 year old male with history of prostate cancer establishing with palliative care today primarily for pain control.  In terms of pain, patient locates the pain just to the right of his rectum, sometimes with radiation down his leg and up his back. He describes the pain as a sharp and excruciating pain. He notes that it is constant. It is worse after moving around all day and worse when sitting down.  It feels better if he massages the area.  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    Palliative Performance Scale: 60% - Ambulation: Reduced / unable to do hobby or some housework, significant disease / Self-Care:Occasional assist as necessary / Intake:Normal or reduced / Level of Conscious: Full or confusion    Social History:   Lives in [place], [place] with his domestic partner, Belenda Cruise (not officially married). They have an 18yo daughter. Also has a 26yo son who runs a comic book store. Mother is living and also a source of support.    Watches church every Sunday - [employer]. Also belongs to a [employer] church.     Occupation: Retired house painter  Hobbies: Likes to race go-karts and cars. Likes to watch his nephew do this as well.     Objective     Hematology/Oncology History Overview Note   In 03/2022, seen in ER for gross hematuria, worsening LUTS. CT bladder showing bladder mass.  In 04/2022, PSA 18. Had cysto, prostate biopsy, TURP. Prostate biopsy showed Gleason 4+5=9 (GG 5), 10/12 cores positive for cancer. TURP specimen showed mixed acinar/ductal features, cribriborm morphology.  In 05/2022, CT and bone scan showed no pelvic or distant mets  In 06/2022, ADT started with Degarelix, continued with Eligard.  In 06/2022, PSMA PET showed avidity in R inferior pubic ramus, suspicious for single bone met.  In 07/2022, abiraterone/prednisone started.  In 08/2022, RT to prostate/SV/nodes and pubic ramus bone met site, completed in 12/2022.  In 05/2023, abiraterone held due to hypertension and hypokalemia. Resumed after correction     Prostate cancer (CMS-HCC)   06/17/2022 Initial Diagnosis    Prostate cancer (CMS-HCC)     06/17/2022 -  Cancer Staged    Staging form: Prostate, AJCC 8th Edition  - Clinical: Stage IIIC (cT4, cN0, cM0, PSA: 18, Grade Group: 5) - Signed by Young E Whang, MD on 06/19/2022       07/07/2022 - 07/07/2022 Endocrine/Hormone Therapy    OP DEGARELIX  Plan Provider: Young E Whang, MD     08/11/2022 Endocrine/Hormone Therapy    OP PROSTATE LEUPROLIDE (ELIGARD) 45 MG EVERY 6 MONTHS  Plan Provider: Young E Whang, MD     10 /10/2022 - 10/02/2023 Radiation    Radiation Therapy Treatment Details (09/10/2022 - 10/02/2023)  Site: Prostate  Technique: IMRT  Goal: No goal specified  Planned Treatment Start Date: No planned start date specified       REVIEW OF SYSTEMS:  A comprehensive review of 10 systems was negative except for pertinent positives noted in HPI.    PHYSICAL EXAM:   GEN: NAD, lying in bed  EYES: EOMI, anicteric sclera, no discharge  ENT: MMM, atraumatic   CV: no evidence of cyanosis  PULM: CTAB, No increased work of breathing  MSK: no sarcopenia  EXT: No LE edema  NEURO: AO x 3, No gross focal deficits, 5/5 strength throughout, no myoclonus  PSYCH: mood and affect appropriate, no agitation       Results  LABS  PSA: undetectable (02/15/2024)  I personally spent 40 minutes face-to-face and non-face-to-face in the care of this patient, which includes all pre, intra, and post visit time on the date of service.  All documented time was specific to the E/M visit and does not include any procedures that may have been performed.      Redgie Grayer, MD, MEd  [facility] Outpatient Oncology Palliative Care

## 2024-02-15 ENCOUNTER — Other Ambulatory Visit: Admit: 2024-02-15 | Discharge: 2024-02-15 | Payer: PRIVATE HEALTH INSURANCE

## 2024-02-15 ENCOUNTER — Ambulatory Visit
Admit: 2024-02-15 | Discharge: 2024-02-15 | Payer: PRIVATE HEALTH INSURANCE | Attending: Hematology & Oncology | Primary: Hematology & Oncology

## 2024-02-15 ENCOUNTER — Ambulatory Visit
Admit: 2024-02-15 | Discharge: 2024-02-15 | Payer: PRIVATE HEALTH INSURANCE | Attending: Student in an Organized Health Care Education/Training Program | Primary: Student in an Organized Health Care Education/Training Program

## 2024-02-15 ENCOUNTER — Ambulatory Visit: Admit: 2024-02-15 | Discharge: 2024-02-15 | Payer: PRIVATE HEALTH INSURANCE

## 2024-02-15 DIAGNOSIS — G893 Neoplasm related pain (acute) (chronic): Principal | ICD-10-CM

## 2024-02-15 DIAGNOSIS — C61 Malignant neoplasm of prostate: Principal | ICD-10-CM

## 2024-02-15 DIAGNOSIS — R530 Neoplastic (malignant) related fatigue: Principal | ICD-10-CM

## 2024-02-15 DIAGNOSIS — R630 Anorexia: Principal | ICD-10-CM

## 2024-02-15 DIAGNOSIS — Z515 Encounter for palliative care: Principal | ICD-10-CM

## 2024-02-15 DIAGNOSIS — K5903 Drug induced constipation: Principal | ICD-10-CM

## 2024-02-15 LAB — PSA: PROSTATE SPECIFIC ANTIGEN: <0.04 ng/mL (ref 0.00–4.00)

## 2024-02-15 LAB — HEPATIC FUNCTION PANEL
ALBUMIN: 3.8 g/dL (ref 3.4–5.0)
ALKALINE PHOSPHATASE: 136 U/L — ABNORMAL HIGH (ref 46–116)
ALT (SGPT): <7 U/L — ABNORMAL LOW (ref 10–49)
AST (SGOT): 10 U/L (ref <=34)
BILIRUBIN DIRECT: 0.1 mg/dL (ref 0.00–0.30)
BILIRUBIN TOTAL: 0.4 mg/dL (ref 0.3–1.2)
PROTEIN TOTAL: 6.5 g/dL (ref 5.7–8.2)

## 2024-02-15 LAB — POTASSIUM: POTASSIUM: 3.9 mmol/L (ref 3.5–5.1)

## 2024-02-15 MED ORDER — SENNOSIDES 8.6 MG TABLET
ORAL_TABLET | Freq: Every evening | ORAL | 2 refills | 30.00 days | Status: CP
Start: 2024-02-15 — End: 2025-02-14

## 2024-02-15 MED ORDER — PREDNISONE 5 MG TABLET
ORAL_TABLET | Freq: Every day | ORAL | 11 refills | 30.00 days | Status: CP
Start: 2024-02-15 — End: 2024-02-15

## 2024-02-15 MED ADMIN — leuprolide acetate (6 month) (ELIGARD) injection 45 mg: 45 mg | SUBCUTANEOUS | @ 15:00:00 | Stop: 2024-02-15

## 2024-02-15 MED FILL — ABIRATERONE 250 MG TABLET: ORAL | 30 days supply | Qty: 120 | Fill #5

## 2024-03-08 ENCOUNTER — Ambulatory Visit: Admit: 2024-03-08 | Discharge: 2024-03-09

## 2024-03-08 DIAGNOSIS — E785 Hyperlipidemia, unspecified: Principal | ICD-10-CM

## 2024-03-08 DIAGNOSIS — F17200 Nicotine dependence, unspecified, uncomplicated: Principal | ICD-10-CM

## 2024-03-08 DIAGNOSIS — R799 Abnormal finding of blood chemistry, unspecified: Principal | ICD-10-CM

## 2024-03-08 DIAGNOSIS — Z8679 Personal history of other diseases of the circulatory system: Principal | ICD-10-CM

## 2024-03-08 DIAGNOSIS — I63231 Cerebral infarction due to unspecified occlusion or stenosis of right carotid arteries: Principal | ICD-10-CM

## 2024-03-08 DIAGNOSIS — C61 Malignant neoplasm of prostate: Principal | ICD-10-CM

## 2024-03-08 DIAGNOSIS — C4490 Unspecified malignant neoplasm of skin, unspecified: Principal | ICD-10-CM

## 2024-03-08 NOTE — Unmapped (Signed)
 Assessment and Plan:     Patrick Montgomery was seen today for follow-up.    Diagnoses and all orders for this visit:    Cerebrovascular accident (CVA) due to occlusion of right carotid artery  Hyperlipidemia with target LDL less than 70  History of hypertension          -     Patient adherent with 81 mg aspirin, 10 mg amlodipine.  Blood pressure normotensive        -     Due to history of CVA and stent placement, patient would benefit from statin therapy will check with oncologist if statin compatible and safe with oncology medication        -    Last PVLs of carotids--2018        -    LDL 59, Cr 0.87, K 4.7 A1c 5.6  -     PVL Carotid Duplex Bilateral; Future  -     Lipid Panel; Future, A1c, BMP, Vitamin D    Skin cancer           - Hx of BCC, followed by dermatology         - Last visit 12/15/23    Prostate cancer           -History of prostate cancer with metastasis, PSA remains undetectable on prednisone and abiraterone         -Followed at regular intervals by both oncology and pain management.    Tobacco dependence          -Patient states he is smoking like a chimney         -Patient precontemplative.  Declined NRT, Chantix or Zyban/referral to smoking cessation counselor        -Advised can call or message provider if desires cessation resources        -Pulmonary nodules noted on last PET August, 2023    Abnormal blood chemistry  -     Vitamin D 25 Hydroxy (25OH D2 + D3); Future    Headaches: Likely secondary to chronic right sided neck and shoulder pain.  Home care advice and reassurance, return instructions reviewed with patient..    Barriers to recommended plan: None identified    Return for 6 months .      Subjective:     HPI: Patrick Montgomery is a 63 y.o. male here for Follow-up.    Follow up:    Hx of metastatic prostate cancer:  Patient by oncology, last visit March, 2025   PSA remains undetectable-pt taking Prednisone while on Abiraterone.   Radiation therapy--fractured tailbone, pain in recturm--pain managed by pain provider.  Feels intensity of pain is improving, still has daily episodes which are worse at night.  Weight remains stable, taking 1 can of Ensure per day.    Headaches: pt states 'sometimes bad', concerned as similar symptoms with stroke.  X 3-4 headaches per week, 'painful', R parietal area, no aggrevating factors. Headaches resolve on own, lasts a few minutes. Hx of chronic neck pain/shoulder pain.  Thinks his right sided neck pain is exacerbating headaches.    Hx of CVA:  Pt taking 81 mg ASA, stopped statin  Would like carotid PVLs to assess patency of stent    HTN: normo-tensive and adherent with 10 mg amlodipine    Tobacco use: Pt continues to smoke heavily, pre-contemplative  08/23 PET SCAN: Subcentimeter pulmonary nodules which are below the resolution of PET/CT and require continued attention on follow-up.  I have reviewed past medical, surgical, medications, allergies, social and family histories today and updated them in Epic where appropriate.    ROS:   Review of Systems   Respiratory: Negative.     Cardiovascular: Negative.    Gastrointestinal:  Positive for rectal pain.   Musculoskeletal:  Positive for arthralgias and back pain.   Neurological:  Positive for headaches. Negative for tremors, syncope, facial asymmetry, speech difficulty, weakness, light-headedness and numbness.   Psychiatric/Behavioral: Negative.     All other systems reviewed and are negative.         PHQ-9 PHQ-9 Total Score   03/08/2024  10:15 AM 2   07/08/2021   3:00 PM 6      GAD7 Total Score GAD-7 Total Score   03/08/2024  10:15 AM 1   07/08/2021   3:00 PM 8      Review of systems negative unless otherwise noted as per HPI.      Objective:     Vitals:    03/08/24 0946   BP: 120/70   Pulse: 62   Temp: 35.9 ??C (96.6 ??F)   SpO2: 98%     Body mass index is 21.32 kg/m??.    Physical Exam  Vitals and nursing note reviewed.   Constitutional:       Appearance: Normal appearance.   HENT:      Head: Normocephalic and atraumatic.   Cardiovascular:      Rate and Rhythm: Normal rate and regular rhythm.      Pulses: Normal pulses.      Heart sounds: Normal heart sounds.   Pulmonary:      Effort: Pulmonary effort is normal.      Breath sounds: Normal breath sounds.   Musculoskeletal:         General: Normal range of motion.      Cervical back: Normal range of motion and neck supple.   Skin:     General: Skin is warm.      Capillary Refill: Capillary refill takes less than 2 seconds.   Neurological:      Mental Status: He is alert. Mental status is at baseline.   Psychiatric:         Mood and Affect: Mood normal.         Behavior: Behavior normal.         Thought Content: Thought content normal.         Judgment: Judgment normal.            Medication adherence and barriers to the treatment plan have been addressed. Opportunities to optimize healthy behaviors have been discussed. Patient / caregiver voiced understanding.   I personally spent 30 minutes face-to-face and non-face-to-face in the care of this patient, which includes all pre, intra, and post visit time on the date of service.  Suszanne Eriksson, DNP, FNP-C  [facility]  [phone] (F)    Note - This record has been created using [employer]. Chart creation errors have been sought, but may not always have been located. Such creation errors do not reflect on the standard of medical care.

## 2024-03-10 DIAGNOSIS — E785 Hyperlipidemia, unspecified: Principal | ICD-10-CM

## 2024-03-10 DIAGNOSIS — I63231 Cerebral infarction due to unspecified occlusion or stenosis of right carotid arteries: Principal | ICD-10-CM

## 2024-03-10 MED ORDER — ATORVASTATIN 10 MG TABLET
ORAL_TABLET | Freq: Every day | ORAL | 3 refills | 90.00 days | Status: CP
Start: 2024-03-10 — End: ?

## 2024-03-12 DIAGNOSIS — C61 Malignant neoplasm of prostate: Principal | ICD-10-CM

## 2024-03-12 MED ORDER — OXYCODONE 5 MG TABLET
ORAL_TABLET | Freq: Four times a day (QID) | ORAL | 0 refills | 14.00 days | PRN
Start: 2024-03-12 — End: ?

## 2024-03-13 MED ORDER — OXYCODONE 5 MG TABLET
ORAL_TABLET | Freq: Four times a day (QID) | ORAL | 0 refills | 13.00 days | Status: CP | PRN
Start: 2024-03-13 — End: ?

## 2024-03-15 ENCOUNTER — Inpatient Hospital Stay: Admit: 2024-03-15 | Discharge: 2024-03-16 | Payer: Medicaid (Managed Care)

## 2024-03-16 DIAGNOSIS — I63231 Cerebral infarction due to unspecified occlusion or stenosis of right carotid arteries: Principal | ICD-10-CM

## 2024-03-16 DIAGNOSIS — I6522 Occlusion and stenosis of left carotid artery: Principal | ICD-10-CM

## 2024-03-17 NOTE — Unmapped (Signed)
 Referral for patient with hx of CVA 2017 and RICA stent placed at [facility] at that time, duplex performed, ready to schedule

## 2024-03-22 NOTE — Unmapped (Signed)
 [facility] Refill Coordination Note    Specialty Medication(s) to be Shipped:   Hematology/Oncology: abiraterone  250 mg    Other medication(s) to be shipped: No additional medications requested for fill at this time     Patrick Montgomery, DOB: [birth year]  Phone: There are no phone numbers on file.      All above HIPAA information was verified with patient.     Was a Nurse, learning disability used for this call? No    Completed refill call assessment today to schedule patient's medication shipment from the [facility]  [phone]).  All relevant notes have been reviewed.     Specialty medication(s) and dose(s) confirmed: Regimen is correct and unchanged.   Changes to medications: Patrick Montgomery reports no changes at this time.  Changes to insurance: No  New side effects reported not previously addressed with a pharmacist or physician: None reported  Questions for the pharmacist: No    Confirmed patient received a Conservation officer, historic buildings and a Surveyor, mining with first shipment. The patient will receive a drug information handout for each medication shipped and additional FDA Medication Guides as required.       DISEASE/MEDICATION-SPECIFIC INFORMATION        N/A    SPECIALTY MEDICATION ADHERENCE     Medication Adherence    Patient reported X missed doses in the last month: 1  Specialty Medication: Abiraterone  250mg   Patient is on additional specialty medications: No  Informant: patient     Were doses missed due to medication being on hold? No    Abiraterone  250 mg: 10 days of medicine on hand       REFERRAL TO PHARMACIST     Referral to the pharmacist: Not needed      [facility]     Shipping address confirmed in Epic.     Cost and Payment: Patient has a copay of $4. They are aware and have authorized the pharmacy to charge the credit card on file.    Delivery Scheduled: Yes, Expected medication delivery date: 03/29/24.     Medication will be delivered via Next Day Courier to the prescription address in Epic [place].    Patrick Montgomery   [facility]  Specialty Technician

## 2024-03-28 MED FILL — ABIRATERONE 250 MG TABLET: ORAL | 30 days supply | Qty: 120 | Fill #6

## 2024-04-04 ENCOUNTER — Ambulatory Visit
Admit: 2024-04-04 | Discharge: 2024-04-05 | Payer: Medicaid (Managed Care) | Attending: Student in an Organized Health Care Education/Training Program | Primary: Student in an Organized Health Care Education/Training Program

## 2024-04-04 DIAGNOSIS — Z79899 Other long term (current) drug therapy: Principal | ICD-10-CM

## 2024-04-04 DIAGNOSIS — C61 Malignant neoplasm of prostate: Principal | ICD-10-CM

## 2024-04-04 DIAGNOSIS — G893 Neoplasm related pain (acute) (chronic): Principal | ICD-10-CM

## 2024-04-04 NOTE — Unmapped (Signed)
 OUTPATIENT ONCOLOGY PALLIATIVE CARE CONSULT NOTE    Patrick Montgomery is a 63 y.o. male who is seen in consultation at the request of Thresea Flor* for evaluation of Symptoms     Principal Diagnosis: Patrick Montgomery is a 63 y.o. male with prostate cancer, diagnosed in June 2023 now on ADT with Eliguard.  Disease sites include prostate, ?solitary bone metastasis.     Assessment/Plan:     #Cancer-Related Pain; Pudendal Neuralgia: Improving. 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 Now s/p pudendal nerve block with mild improvement.  Also describes a neuropathic burning sensation with radiation down leg.   - patient decided not to follow-up with Dr. Bevin Bucks and interventional team  - Continue wean of oxycodone  5-10 mg q4 hrs PRN --> now no more than 3 tablets per day, # 84 tablets this month, plan for #56 next month  - Discussed buprenorphine patch previously if pain persists and oxycodone  reduction is not feasible.  - s/p lyrica  to 150mg  BID  - Cont tylenol  1000 mg TID PRN  - Cont flexeril  10mg  nightly PRN  - Cont Lidocaine  ointment to tender area of skin  - UDS today    Prior Medications:   - MS contin  - didn't find effective.  Oxycodone  5 mg effective.  - Naproxen  BID - mild benefit  - Tylenol  - mild to no benefit    #Cancer-Related Fatigue: Stable. Still able to be active. Gets out of house daily.   - Encouraged light exercise as able    #Early Satiety: Improved, but no increase in weight.   - If worsens, patinet open to nutrition referral in future  - CTM, possible marinol trial in future if appetite becomes issue    #Constipation: Improved, now with loose stools. Intermittent constipation increases pudendal nerve pressure. Senna recommended for regularity.  - cont senna to 2 tablet nightly - hold for loose stools  - cont MiraLAX  1-2 times daily PRN    Prostate Cancer  Well-managed with undetectable PSA. On Zytiga , monitor for gastrointestinal side effects.  - Mngt per onc    #Mood/Coping: Stable.  Good support from his wife, mother and children. Stays positive by nature. Says he would be perfect if not for lingering pain.   - Also shares some concerns about loss of libido - will discuss in more detail with onc team at next visit    #Goals of Care/Prognostic Awareness: Previously provided patient with prepare for your care paperwork and introduced the concepts of CODE STATUS and the purpose of advance directives.  Patient appreciated having something to take at home and complete with his partner. Hasn't returned this to clinic yet.   - Remains on board with current plan of care.  Glad to hear his PSA is undetectable      From prior convos:  - Knows he has prostate cancer and is followed by Dr. Donna Fus.  Knows that recent PSA was good.  Understands that he is taking medicine through 2026 for this.  Unsure if his disease is curable or not and what the overall trajectory of his prognosis is, but would like to know.  Is a type of person who would want to know details about his disease and prognosis going forward.  - Patient shares his understanding that he has metastatic prostate cancer. He does not recall much about what to expect from this cancer, including about prognosis. He knows that there are several treatment options, but is not sure whether it is  curable or not.   - We asked whether he is the type of person who wants to know details about prognosis and he reports that he would like to know. He appreciates honest and direct communication. We said that we would discuss with oncology so that we can provide accurate communication.   - Finally, we asked who his preferred HCDM is and he says that it is his domestic partner, Patrick Montgomery. We discussed that she would not be default HCPOA by state law and recommended completing formal HCPOA paperwork to allow her to be his surrogate if needed in the future.     #Advance Care Planning:  HCDM:   HCDM (patient stated preference): Patrick Montgomery - Domestic Partner [phone]  Natural surrogate decision maker: majority of adult children and living parents  Advance Directive: none on file - wants to do HCPOA      #Controlled substances risk management.   - Patient has a signed pain medication agreement with Outpatient Palliative Care, completed on 01/28/23, as per standard of care.   - [place] database was reviewed today and it was appropriate.   - Urine drug screen was performed at this visit. Findings: periodic screening has been appropriate.   - Patient has received information about safe storage and administration of medications.   - Patient has received a prescription for narcan  and patient is not applicable.     F/u: 6-8 weeks    ----------------------------------------    Referring Provider: Raenette Bumps  Oncology Team: Alejos Husband  PCP: Garrett Kallman, FNP    Interval Hx, 04/04/24    History of Present Illness    Patrick Montgomery is a 63 year old male with prostate cancer who presents for pain management and medication adjustment.    Per patient, pain less intense and more tolerable than before, although it still bothers him when lying down or sitting for extended periods. He is currently taking oxycodone  10mg  in morning and at night and believes he can keep weaning.     On ROS, he experiences frequent bowel movements, approximately three to four times a day, which he attributes to oral cancer-directed therapy. No constipation, but there is increased gas.    He has recently received new dentures and is adjusting to them, affecting his ability to chew solid foods. He is consuming soft foods and drinking Ensure daily to maintain nutrition. STill hasn't been able to gain weight, despite eating as much as he use to before diagnosis.     He urinates frequently, approximately three to four times in the morning. He attributes the increased frequency to his history of prostate cancer and radiation treatment.    His energy levels are good. Not limited in his activity. No issues with dyspnea or numbness, or tingling in his extremities.    He is planning a fishing trip with his brother and is concerned about obtaining his medication before leaving. He is currently taking aspirin , cholesterol medication, blood pressure medication, and a potassium pill, but has stopped taking Lyrica .      HPI: Patrick Montgomery is a 63 year old male with history of prostate cancer establishing with palliative care today primarily for pain control.    In terms of pain, patient locates the pain just to the right of his rectum, sometimes with radiation down his leg and up his back. He describes the pain as a sharp and excruciating pain. He notes that it is constant. It is worse after moving around all day and  worse when sitting down.  It feels better if he massages the area.  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    Palliative Performance Scale: 60% - Ambulation: Reduced / unable to do hobby or some housework, significant disease / Self-Care:Occasional assist as necessary / Intake:Normal or reduced / Level of Conscious: Full or confusion    Social History:   Lives in [place], [place] with his domestic partner, Patrick (not officially married). They have an 18yo daughter. Also has a 26yo son who runs a comic book store. Mother is living and also a source of support.    Watches church every Sunday - [employer]. Also belongs to a [employer] church.     Occupation: Retired house painter  Hobbies: Likes to race go-karts and cars. Likes to watch his nephew do this as well.     Objective     Hematology/Oncology History Overview Note   In 03/2022, seen in ER for gross hematuria, worsening LUTS. CT bladder showing bladder mass.  In 04/2022, PSA 18. Had cysto, prostate biopsy, TURP. Prostate biopsy showed Gleason 4+5=9 (GG 5), 10/12 cores positive for cancer. TURP specimen showed mixed acinar/ductal features, cribriborm morphology.  In 05/2022, CT and bone scan showed no pelvic or distant mets  In 06/2022, ADT started with Degarelix, continued with Eligard.  In 06/2022, PSMA PET showed avidity in R inferior pubic ramus, suspicious for single bone met.  In 07/2022, abiraterone/prednisone started.  In 08/2022, RT to prostate/SV/nodes and pubic ramus bone met site, completed in 12/2022.  In 05/2023, abiraterone held due to hypertension and hypokalemia. Resumed after correction     Prostate cancer   06/17/2022 Initial Diagnosis    Prostate cancer (CMS-HCC)     06/17/2022 -  Cancer Staged    Staging form: Prostate, AJCC 8th Edition  - Clinical: Stage IVB (cT4, cN0, cM1b, PSA: 18, Grade Group: 5) - Signed by Whang, Young E, MD on 02/16/2024       07/07/2022 - 07/07/2022 Endocrine/Hormone Therapy    OP DEGARELIX  Plan Provider: Young E Whang, MD     08/11/2022 Endocrine/Hormone Therapy    OP PROSTATE LEUPROLIDE (ELIGARD) 45 MG EVERY 6 MONTHS  Plan Provider: Young E Whang, MD     10 /10/2022 - 10/02/2023 Radiation    Radiation Therapy Treatment Details (09/10/2022 - 10/02/2023)  Site: Prostate  Technique: IMRT  Goal: No goal specified  Planned Treatment Start Date: No planned start date specified       REVIEW OF SYSTEMS:  A comprehensive review of 10 systems was negative except for pertinent positives noted in HPI.    PHYSICAL EXAM:   GEN: NAD, lying in bed  EYES: EOMI, anicteric sclera, no discharge  ENT: MMM, atraumatic   CV: no evidence of cyanosis  PULM: CTAB, No increased work of breathing  MSK: no sarcopenia  EXT: No LE edema  NEURO: AO x 3, No gross focal deficits, 5/5 strength throughout, no myoclonus  PSYCH: mood and affect appropriate, no agitation       I personally spent 40 minutes face-to-face and non-face-to-face in the care of this patient, which includes all pre, intra, and post visit time on the date of service.  All documented time was specific to the E/M visit and does not include any procedures that may have been performed.      Graciela Lava, MD, MEd  [facility]  Outpatient Oncology Palliative Care

## 2024-04-06 MED ORDER — OXYCODONE 5 MG TABLET
ORAL_TABLET | Freq: Three times a day (TID) | ORAL | 0 refills | 14.00000 days | Status: CP | PRN
Start: 2024-04-06 — End: ?

## 2024-04-26 ENCOUNTER — Ambulatory Visit
Admit: 2024-04-26 | Discharge: 2024-04-27 | Payer: Medicaid (Managed Care) | Attending: Vascular Surgery | Primary: Vascular Surgery

## 2024-04-26 DIAGNOSIS — I63231 Cerebral infarction due to unspecified occlusion or stenosis of right carotid arteries: Principal | ICD-10-CM

## 2024-04-26 DIAGNOSIS — I6522 Occlusion and stenosis of left carotid artery: Principal | ICD-10-CM

## 2024-04-26 NOTE — Unmapped (Signed)
 Visit date:  04/26/24     Reason for visit: carotid artery stenosis    Referring Physician: Suszanne Eriksson FNP    Assessment/Plan:    Patrick Montgomery is a 63 y.o. 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  Carotid duplex imaging demonstrates patent right ICA stent and <50% stenosis in the left ICA.  We will follow-up with Patrick Montgomery in 1 year with bilateral carotid artery duplex.  He should continue daily aspirin  and statin therapy.     Tobacco use:   Patrick Montgomery was strongly encouraged to quit smoking as tobacco use can contribute to carotid artery disease.       HPI: Patrick Montgomery is a 63 y.o. 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  On 01/27/16 he underwent 4 vessel angiogram at [facility] with stent retriever thrombectomy right ICA/MCA occlusion with successful angioplasty and stent placement right proximal cervical ICA.  He underwent follow-up carotid duplex imaging in April and was referred to our services and is seen in consultation at the request of Suszanne Eriksson FNP for further evaluation of his carotid artery disease.     Patrick Montgomery presents to the clinic today by himself.  His appetite is returning.  He experiences headaches on the right side of his head.  He has occasional numbness in his right arm that does not occur often.  He reports DOE and a dry cough.  He has occasional abdominal pain and some constipation.  He has chronic right buttock pain.  He denies fever, chills, fatigue, vision changes, amaurosis fugax, slurred speech, facial droop, unilateral weakness, dysphagia, chest pain/pressure, wheezing, abdominal pain, back pain, nausea, vomiting, diarrhea, difficulty with urination, leg pain, BLE edema. He does not check his BPs at home. He smokes 2 PPD.  He takes aspirin  and lipitor.     Allergies: No Known Allergies    Current Outpatient Medications   Medication Sig Dispense Refill    abiraterone  (ZYTIGA ) 250 mg tablet Take 4 tablets (1,000 mg total) by mouth daily. 120 tablet 11    amlodipine  (NORVASC ) 10 MG tablet Take 1 tablet (10 mg total) by mouth daily. 30 tablet 11    aspirin  (ECOTRIN) 81 MG tablet Take 1 tablet (81 mg total) by mouth daily. 90 tablet 3    atorvastatin  (LIPITOR) 10 MG tablet Take 1 tablet (10 mg total) by mouth daily. 90 tablet 3    naloxone  (NARCAN ) 4 mg nasal spray One spray in either nostril once for known/suspected opioid overdose. May repeat every 2-3 minutes in alternating nostril til EMS arrives 2 each 3    oxyCODONE  (ROXICODONE ) 5 MG immediate release tablet Take 1-2 tablets (5-10 mg total) by mouth every eight (8) hours as needed for pain. No more than 3 tablets per day. 84 tablet 0    polyethylene glycol (MIRALAX ) 17 gram packet Take 17 g by mouth daily. 60 packet 1    potassium chloride  (KLOR-CON ) 10 MEQ CR tablet TAKE 1 TABLET BY MOUTH EVERY DAY 90 tablet 1    predniSONE  (DELTASONE ) 5 MG tablet Take 1 tablet (5 mg total) by mouth daily. 30 tablet 11    senna (SENOKOT) 8.6 mg tablet Take 1-2 tablets by mouth nightly. 60 tablet 2  No current facility-administered medications for this visit.       Past Medical History:   Diagnosis Date    Anxiety     Arthritis     Basal cell carcinoma     Cancer       skin    Gout     Kidney stones     Squamous cell skin cancer     Stroke          Past Surgical History:   Procedure Laterality Date    PR BIOPSY OF PROSTATE,NEEDLE/PUNCH N/A 05/21/2022    Procedure: IMAGE GUIDED BIOPSY, PROSTATE; NEEDLE OR PUNCH, SINGLE OR MULTIPLE, ANY APPROACH;  Surgeon: Anson Kinnier, MD;  [facility];  Service: Urology    PR TRANSURETHRAL ELEC-SURG PROSTATECTOM N/A 05/21/2022    Procedure: TRANSURETHRAL ELECTROSURGICAL RESECT PROSTATE, W/CONTORL POSTOP BLEED, COMPLETE (VASECT, MEATOT, CYSTO ETC);  Surgeon: Anson Kinnier, MD;  Location: [facility] OR [facility];  Service: Urology    SKIN BIOPSY      skin cancer removal           Social History     Socioeconomic History    Marital status: Single   Tobacco Use    Smoking status: Every Day     Current packs/day: 1.50     Average packs/day: 1.5 packs/day for 45.0 years (67.5 ttl pk-yrs)     Types: Cigarettes    Smokeless tobacco: Never   Vaping Use    Vaping status: Never Used   Substance and Sexual Activity    Alcohol use: No     Comment: socially     Drug use: Yes     Types: Marijuana    Sexual activity: Not Currently     Partners: Female     Birth control/protection: None   Other Topics Concern    Do you use sunscreen? Yes    Tanning bed use? No    Are you easily burned? Yes    Excessive sun exposure? Yes    Blistering sunburns? No     Social Drivers of Psychologist, prison and probation services Strain: Low Risk  (05/06/2022)    Overall Financial Resource Strain (CARDIA)     Difficulty of Paying Living Expenses: Not hard at all   Food Insecurity: Food Insecurity Present (03/08/2024)    Hunger Vital Sign     Worried About Running Out of Food in the Last Year: Sometimes true     Ran Out of Food in the Last Year: Often true   Transportation Needs: No Transportation Needs (03/08/2024)    PRAPARE - Therapist, art (Medical): No     Lack of Transportation (Non-Medical): No   Housing: Low Risk  (03/08/2024)    Housing     Within the past 12 months, have you ever stayed: outside, in a car, in a tent, in an overnight shelter, or temporarily in someone else's home (i.e. couch-surfing)?: No     Are you worried about losing your housing?: No       Family History   Problem Relation Age of Onset    Cancer Father         Skin    Melanoma Father     Squamous cell carcinoma Father     Rashes / Skin problems Father     Cancer Brother         skin     Substance Abuse Disorder Neg Hx  Basal cell carcinoma Neg Hx        ROS: A 10 system ROS was reviewed and was negative aside from that mentioned in the HPI.    PE:    Vitals:    04/26/24 0931   BP: 125/76   Pulse: 67   Temp: 36.3 ??C (97.3 ??F)   SpO2: 98%       General: WD, WN well-appearing male in NAD.    HEENT: Symmetrical facial movements, no facial droop.      Cardiovascular:  RRR, S1, S2.  No murmurs, gallops or rubs.     Lungs: CTAB.  Respirations even, nonlabored.  No wheezes, crackles, rhonchi.    Musculoskeletal: 5/5 BUE and BLE.     Neurological: Alert and oriented x 4.      Psych/Mental Health:  Appropriate affect.       Imaging:    03/15/24 Bilateral Carotid Artery Duplex: these images were reviewed by Dr. Hipolito Luke  Final Interpretation   Right Carotid Artery   ECA appears stenotic. Patent ICA stent.       Left Carotid Artery   Less than 50% stenosis in the ICA.       Vertebral Arteries   Both vertebral arteries were patent with antegrade flow.   Subclavian Arteries   Normal flow hemodynamics were seen in bilateral subclavian arteries.

## 2024-04-27 NOTE — Unmapped (Signed)
 [facility] Refill Coordination Note    Specialty Medication(s) to be Shipped:   Hematology/Oncology: abiraterone  250 mg    Other medication(s) to be shipped: No additional medications requested for fill at this time     Patrick Montgomery, DOB: 05-14-1961  Phone: There are no phone numbers on file.      All above HIPAA information was verified with patient.     Was a Nurse, learning disability used for this call? No    Completed refill call assessment today to schedule patient's medication shipment from the [facility]  [phone]).  All relevant notes have been reviewed.     Specialty medication(s) and dose(s) confirmed: Regimen is correct and unchanged.   Changes to medications: Patrick Montgomery reports no changes at this time.  Changes to insurance: No  New side effects reported not previously addressed with a pharmacist or physician: None reported  Questions for the pharmacist: No    Confirmed patient received a Conservation officer, historic buildings and a Surveyor, mining with first shipment. The patient will receive a drug information handout for each medication shipped and additional FDA Medication Guides as required.       DISEASE/MEDICATION-SPECIFIC INFORMATION        N/A    SPECIALTY MEDICATION ADHERENCE     Medication Adherence    Patient reported X missed doses in the last month: 1  Specialty Medication: Abiraterone  250 mg  Patient is on additional specialty medications: No  Informant: patient     Were doses missed due to medication being on hold? No    Abiraterone  250 mg: 12 days of medicine on hand       REFERRAL TO PHARMACIST     Referral to the pharmacist: Not needed      [facility]     Shipping address confirmed in Epic.     Cost and Payment: Unable to pay for medication    Delivery Scheduled: Yes, Expected medication delivery date: 05/05/24.     Medication will be delivered via Next Day Courier to the prescription address in Epic [place].    Patrick Montgomery   [facility]  Specialty Technician

## 2024-05-04 MED FILL — ABIRATERONE 250 MG TABLET: ORAL | 30 days supply | Qty: 120 | Fill #7

## 2024-05-07 DIAGNOSIS — C61 Malignant neoplasm of prostate: Principal | ICD-10-CM

## 2024-05-07 MED ORDER — OXYCODONE 5 MG TABLET
ORAL_TABLET | Freq: Three times a day (TID) | ORAL | 0 refills | 14.00000 days | PRN
Start: 2024-05-07 — End: ?

## 2024-05-10 MED ORDER — OXYCODONE 5 MG TABLET
ORAL_TABLET | Freq: Two times a day (BID) | ORAL | 0 refills | 14.00000 days | Status: CP | PRN
Start: 2024-05-10 — End: ?

## 2024-05-17 NOTE — Unmapped (Signed)
 [facility] Oncology Visit Note    Patient Name: Patrick Montgomery  Patient Age: 63 y.o.  Encounter Date: 05/18/2024  Attending Provider:  Lovell Roe E. 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    Patrick Montgomery is a 63 y.o. male who presents for management of newly diagnosed prostate cancer. He was initially seen in the ER In May 2023 for gross hematuria and  worsening LUTS. A CT scan of the bladder revealed a bladder mass. In June 2023, he was found to have a PSA of 18. He underwent cystoscopy, prostate biopsy, and TURP. Prostate biopsy showed Gleason 4+5=9 (GG 5), 10/12 cores positive for cancer. TURP specimen showed mixed acinar/ductal features, cribriborm morphology. In July 2023, CT and bone scan showed no pelvic or distant metastases.    On 06/18/22, we discussed management of his recent diagnosis of prostate cancer. Given that he has been diagnosed with prostate cancer at a very Kelyn Ponciano age, I informed the patient that there may be features that indicate a higher risk of hereditary prostate cancer. 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   Based on current imaging, his cancer is confined to the prostate. A PSMA PET scan will be ordered to see if the cancer has metastasized.     On 07/07/2022, pt is now ready to move ahead with treatment. Will proceed with ADT plus abiraterone  for two years, per [facility]AMP[facility].  PSMA PET still undergoing authorization.    On 08/11/2022, most recent PSA on 08/01/2022 is 0.27. PSMA PET scan from 07/27/2022 showed avidity involving the right inferior pubic ramus which is suspicious for single site of osseous metastatic disease. Will proceed with Eligard  shot and proceed with radiation with Dr. Pearlstein. May consider radiating single site of metastatic disease at the right inferior pubic ramus     On 12/03/2022, PSA <0.04, on ADT plus abi, s/p RT to all involved sites. For pain, unlikely to be from cancer, may benefit from pain clinic management long-term.    On 02/16/23, PSA remains <0.04. Received ADT today and will continue abiraterone /prednisone . BP mildly elevated at 146/81, will monitor. Chronic pain being addressed with support from palliative care team. Will RTC in 3 months.    On 05/18/2023, PSA remains undetectable. BP elevated at 166/72, potassium 3.2 today. Hold abiraterone  for now, will have CPP Izetta Search, Pharm D follow up on these and restart abi (with or without dose reduction).     On 08/18/23, PSA remains undetectable. His BP improved from last visit at 151/74, K 4.4 and still remains on potassium supplement. He remains off of abiraterone . Has not checked BP at home. He is due for Eligard  today. Given elevated BP, will  increase amlodipine  to 10 mg for BP management and restart abiraterone  and continue prednisone .    On 11/16/2023, PSA remains undetectable. Continue current course of treatment.     On 02/15/2024, PSA remains undetectable. Re-discussed importance of taking Prednisone  while on Abiraterone . Continue current course of treatment.      Plan:  Continue ADT with Eligard  last given on 02/15/2024. Anticipate 3 years of therapy (till 8-07/2025)  --Follow PSA  Continue Abiraterone  1000 mg daily and continue prednisone  5 mg daily x 2 years (Until ~07/2024)  Potassium supplement continued  Follow labs/BP  3. NGS returned without actionable mutations, germline testing negative on [facility] normal  4. Follow up with palliative care as scheduled  4. RTC in 3 months  Assessment & Plan  Prostate cancer with oligometastatic disease  Prostate cancer under treatment with abiraterone  for 21 months. PSA, potassium, and liver function tests normal. Not taking prednisone  noted.  - Send new prescription for prednisone  to [facility] at [address]  - Instruct him to take prednisone  as prescribed for the remaining three months of treatment.  - Schedule follow-up appointment in three months.  - Continue with ADT for another year vs consider stopping at 2 year.    Radiation-induced sacral pain  Chronic neuropathic sacral pain post-radiation therapy.    Return in 3 months  I personally spent 40 minutes face-to-face and non-face-to-face in the care of this patient, which includes all pre, intra, and post visit time on the date of service.  All documented time was specific to the E/M visit and does not include any procedures that may have been performed.       Reason for Visit  Follow up of prostate cancer.     History of Present Illness:  Hematology/Oncology History Overview Note   In 03/2022, seen in ER for gross hematuria, worsening LUTS. CT bladder showing bladder mass.  In 04/2022, PSA 18. Had cysto, prostate biopsy, TURP. Prostate biopsy showed Gleason 4+5=9 (GG 5), 10/12 cores positive for cancer. TURP specimen showed mixed acinar/ductal features, cribriborm morphology.  In 05/2022, CT and bone scan showed no pelvic or distant mets  In 06/2022, ADT started with Degarelix, continued with Eligard .  In 06/2022, PSMA PET showed avidity in R inferior pubic ramus, suspicious for single bone met.  In 07/2022, abiraterone /prednisone  started.  In 08/2022, RT to prostate/SV/nodes and pubic ramus bone met site, completed in 12/2022.  In 05/2023, abiraterone  held due to hypertension and hypokalemia. Resumed after correction     Prostate cancer      06/17/2022 Initial Diagnosis    Prostate cancer (CMS-HCC)     06/17/2022 - Cancer Staged    Staging form: Prostate, AJCC 8th Edition  - Clinical: Stage IVB (cT4, cN0, cM1b, PSA: 18, Grade Group: 5) - Signed by Curt Neysa BRAVO, MD on 02/16/2024       07/07/2022 - 07/07/2022 Endocrine/Hormone Therapy    OP DEGARELIX  Plan Provider: Neysa BRAVO Curt, MD     08/11/2022 Endocrine/Hormone Therapy    OP PROSTATE LEUPROLIDE  (ELIGARD ) 45 MG EVERY 6 MONTHS  Plan Provider: Neysa BRAVO Curt, MD     09/10/2022 - 10/02/2023 Radiation    Radiation Therapy Treatment Details (09/10/2022 - 10/02/2023)  Site: Prostate  Technique: IMRT  Goal: No goal specified  Planned Treatment Start Date: No planned start date specified         Interval history  History of Present Illness  Patrick Montgomery is a 63 year old male with  prostate cancer who presents for follow-up.    He experiences ongoing pain in the sacral area, which he attributes to nerve damage from previous radiation therapy. No loose stools or diarrhea, but he does have pain in the area.    He is taking abiraterone , four pills daily, but has not been taking prednisone  as prescribed due to unavailability. He is unsure why the prescription has not been refilled despite [facility]ills [facility]. He mentions that his current medication causes stomach discomfort and gas.       Allergies:  No Known Allergies    Current Medications:    Current Outpatient Medications:     abiraterone  (ZYTIGA ) 250 mg tablet, Take 4 tablets (1,000 mg total) by mouth daily., Disp: 120 tablet, Rfl: 11    amlodipine  (NORVASC ) 10 MG tablet, Take 1 tablet (10 mg total) by mouth daily., Disp: 30 tablet, Rfl: 11    aspirin  (ECOTRIN) 81 MG tablet, Take 1 tablet (81 mg total) by mouth daily., Disp: 90 tablet, Rfl: 3    atorvastatin  (LIPITOR) 10 MG tablet, Take 1 tablet (10 mg total) by mouth daily., Disp: 90 tablet, Rfl: 3    buprenorphine  5 mcg/hour PTWK transdermal patch, Place 1 patch on the skin every seven (7) days., Disp: 4 patch, Rfl: 0    naloxone  (NARCAN ) 4 mg nasal spray, One spray in either nostril once for known/suspected opioid overdose. May repeat every 2-3 minutes in alternating nostril til EMS arrives, Disp: 2 each, Rfl: 3    oxyCODONE  (ROXICODONE ) 5 MG immediate release tablet, Take 1-2 tablets (5-10 mg total) by mouth every twelve (12) hours as needed for pain. No more than 2 tablets per day., Disp: 56 tablet, Rfl: 0    polyethylene glycol (MIRALAX ) 17 gram packet, Take 17 g by mouth daily., Disp: 60 packet, Rfl: 1    potassium chloride  (KLOR-CON ) 10 MEQ CR tablet, TAKE 1 TABLET BY MOUTH EVERY DAY, Disp: 90 tablet, Rfl: 1    predniSONE  (DELTASONE ) 5 MG tablet, Take 1 tablet (5 mg total) by mouth daily., Disp: 30 tablet, Rfl: 11    senna (SENOKOT) 8.6 mg tablet, Take 1-2 tablets by mouth nightly., Disp: 60 tablet, Rfl: 2    Past Medical History and Social History  Past Medical History:   Diagnosis Date    Anxiety     Arthritis     Basal cell carcinoma     Cancer        skin    Gout     Kidney stones     Squamous cell skin cancer     Stroke          Past Surgical History:   Procedure Laterality Date    PR BIOPSY OF PROSTATE,NEEDLE/PUNCH N/A 05/21/2022    Procedure: IMAGE GUIDED BIOPSY, PROSTATE; NEEDLE OR PUNCH, SINGLE OR MULTIPLE, ANY APPROACH;  Surgeon: Alm Bettyann Louder, MD;  Location: [facility] OR [facility];  Service: Urology    PR TRANSURETHRAL ELEC-SURG PROSTATECTOM N/A 05/21/2022    Procedure: TRANSURETHRAL ELECTROSURGICAL RESECT PROSTATE, W/CONTORL POSTOP BLEED, COMPLETE (VASECT, MEATOT, CYSTO ETC);  Surgeon: Alm Bettyann Louder, MD;  Location: [facility] OR [facility];  Service: Urology    SKIN BIOPSY      skin cancer removal           Social History     Occupational History    Not on file   Tobacco Use    Smoking status: Every Day     Current packs/day:  1.50     Average packs/day: 1.5 packs/day for 45.0 years (67.5 ttl pk-yrs)     Types: Cigarettes    Smokeless tobacco: Never   Vaping Use    Vaping status: Never Used   Substance and Sexual Activity    Alcohol use: No Comment: socially     Drug use: Yes     Types: Marijuana    Sexual activity: Not Currently     Partners: Female     Birth control/protection: None       Family History  Family History   Problem Relation Age of Onset    Cancer Father         Skin    Melanoma Father     Squamous cell carcinoma Father     Rashes / Skin problems Father     Cancer Brother         skin     Substance Abuse Disorder Neg Hx     Basal cell carcinoma Neg Hx      Prostate Cancer Family History Assessment:  History of cancer in children (yes/no; if yes, what type AND age of diagnosis): No  Total number of siblings (including deceased): 2  History of cancer in siblings (yes/no; if yes, provide relation, type of cancer, AND age of diagnosis): Brother superficial basal cell; age unknown  History of cancer in parents (yes/no; if yes, please specify parent, type of cancer, AND age of diagnosis): Father superficial basal cell carcinoma; age unknown  History of cancer in aunts/uncles/grandparents (yes/no; if yes, provide relation, type of cancer, AND age of diagnosis): No      Review of Systems:  A comprehensive review of 10 systems was negative except for pertinent positives noted in HPI.    Physical Exam:    VITAL SIGNS:  BP 145/76  - Pulse (!) 48  - Temp 36.4 ??C (97.5 ??F) (Temporal)  - Resp 14  - Ht 167.6 cm (5' 6)  - Wt 59.4 kg (131 lb)  - SpO2 98%  - BMI 21.14 kg/m??   ECOG Performance Status: 1  GENERAL: Well-developed, well-nourished patient in no acute distress.  HEAD: Normocephalic and atraumatic.  EYES: Conjunctivae are normal. No scleral icterus.  MOUTH/THROAT: Oropharynx is clear and moist.  No mucosal lesions.  NECK: Supple, no thyromegaly.  LYMPHATICS: No palpable cervical, supraclavicular, or axillary adenopathy.  CARDIOVASCULAR: Normal rate, regular rhythm and normal heart sounds.  Exam reveals no gallop and no friction rub.  No murmur heard.  PULMONARY/CHEST: Effort normal and breath sounds normal. No respiratory distress.  ABDOMINAL: Soft. There is no distension. There is no tenderness. There is no rebound and no guarding.  MUSCULOSKELETAL: No clubbing, cyanosis, or lower extremity edema.  PSYCHIATRIC: Alert and oriented.  Normal mood and affect.  NEUROLOGIC: No focal motor deficit. Normal gait.  SKIN: Skin is warm, dry, and intact.      Results/Orders:    Lab on 05/18/2024   Component Date Value Ref Range Status    Potassium 05/18/2024 3.7  3.5 - 5.1 mmol/L Final    PSA 05/18/2024 <0.04  0.00 - 4.00 ng/mL Final    Albumin 05/18/2024 3.5  3.4 - 5.0 g/dL Final    Total Protein 05/18/2024 6.4  5.7 - 8.2 g/dL Final    Total Bilirubin 05/18/2024 0.4  0.3 - 1.2 mg/dL Final    Bilirubin, Direct 05/18/2024 0.20  0.00 - 0.30 mg/dL Final    AST 93/80/7974 15  <=34 U/L Final  ALT 05/18/2024 10  10 - 49 U/L Final    Alkaline Phosphatase 05/18/2024 94  46 - 116 U/L Final       Lab Results   Component Value Date    PSA <0.04 05/18/2024    PSA <0.04 02/15/2024    PSA <0.04 11/16/2023    PSA <0.04 09/15/2023    PSA <0.04 08/18/2023    PSA <0.04 05/18/2023    PSA <0.04 02/16/2023    PSA <0.04 01/07/2023    PSA <0.04 12/03/2022    PSA <0.04 10/30/2022         Orders placed or performed in visit on 05/18/24    Clinic Appointment Request Physician, Lab    LAB Appointment Request    Clinic Appointment Request Physician, Lab       Molecular Pathology  Tumor mutation profiling  [facility]: ZMYM3 mutation, TMB 3.7, MSS, no actionable mutations    Germline testing  Negative on [facility] normal draw    Pathology:  A: Prostate, right apex, core biopsy  - Prostatic adenocarcinoma, Gleason score 4 + 4 = 8 (Grade group 4) involving 2 of 2 cores, approximately 4 and 1 mm in linear extent, approximately 20% of total core length.      B: Prostate, left apex, core biopsy  - Prostatic adenocarcinoma, Gleason score 4 + 4 = 8 (Grade group 4) involving 2 of 2 cores, approximately 7 and 5 mm in linear extent, approximately 60% of total core length.   - Perineural invasion identified in this case.      C: Prostate, left mid, core biopsy  - Prostatic adenocarcinoma, Gleason score 4 + 5 = 9 (Grade group 5) involving 2 of 2 cores, approximately 6 and 3mm in linear extent, approximately 90% of total core length.      D: Prostate, right mid, core biopsy  - Prostatic adenocarcinoma, Gleason score 4 + 5 = 9 (Grade group 5) involving 2 of 2 cores, approximately 6 and 5 mm in linear extent, approximately 70% of total core length.      E: Prostate, right base, core biopsy  - Benign prostate tissue.      F: Prostate, left base, core biopsy  - Prostatic adenocarcinoma, Gleason score 4 + 4 = 8 (Grade group 4)  involving 2 of 2 cores, approximately 3 and 1 mm in linear extent, approximately 20% of total core length.      G: Bladder neck and bilateral median lobe, transurethral resection  - Involved by prostatic adenocarcinoma with mixed acinar and ductal features, Gleason score 4 + 4 = 8 (Grade group 4)    - Lymphovascular invasion identified     The pattern 4 in this case demonstrates cribriform morphology.      Imaging results:    CT Chest w Contrast 06/11/22:   No intrathoracic metastasis.       CT Abdomen Pelvis W Contrast 06/11/22:     Enlarged irregular prostate correlates with history of biopsy-proven prostate cancer. There is redemonstrated irregularity of the posterior bladder and increased irregularity of the prostate at the bladder margin compared to prior may be secondary to tumor versus postsurgical/post TURP change. Consider attention on follow-up versus further evaluation with PSMA PET scan.       Questionable sclerotic changes of the right inferior pubic ramus are indeterminate and may represent normal variation although metastatic osseous disease is not excluded. Attention on follow-up bone scan. There is no other evidence of metastatic disease in the abdomen/pelvis.  Additional chronic and incidental findings as described.       Please see same day CT chest report for detailed findings above the diaphragm     NM Bone Scan Whole Body 06/11/22  No findings suggestive of osseous metastatic disease     PET CT 07/27/2022:   Impression   1.Ill-defined tracer avidity associated with the prostate, left greater than right, likely representing biopsy-proven prostate carcinoma.   2.Moderate avidity involving the right inferior pubic ramus which is suspicious for single site of osseous metastatic disease. Otherwise, no definite evidence of additional metastatic disease. More specifically, no avid adenopathy. Subcentimeter pulmonary nodules which are below the resolution of PET/CT and require continued attention on follow-up.     CT CAP   Result Date: 12/15/2022  Impression:   --Mild rectosigmoid wall thickening without surrounding inflammatory changes, favored secondary to radiation changes, with additional imaging findings suggestive of slow bowel transit.   --Increased conspicuity of right inferior pubic ramus mixed lytic sclerotic lesion which may reflect posttreatment changes versus worsening site of disease. No new osseous metastases.     MRI Pelvis Wo Contrast  Result Date: 09/16/2023  Impression:   Poor visualization of the  pudendal and perineal nerves bilaterally.      There is however, extensive post radiation changes of the right-sided adductor and levator ani musculature surrounding the treated lesion within the right inferior pubic rami. These changes correspond to the expected location of anterior distribution of the above-mentioned nerves which indicate the possibility of of its involvement by post radiation damage or mass effect plus or minus scarring as a possible etiology of the patient's symptoms.

## 2024-05-18 ENCOUNTER — Ambulatory Visit
Admit: 2024-05-18 | Discharge: 2024-05-19 | Payer: Medicaid (Managed Care) | Attending: Hematology & Oncology | Primary: Hematology & Oncology

## 2024-05-18 ENCOUNTER — Other Ambulatory Visit: Admit: 2024-05-18 | Discharge: 2024-05-19 | Payer: Medicaid (Managed Care)

## 2024-05-18 DIAGNOSIS — C61 Malignant neoplasm of prostate: Principal | ICD-10-CM

## 2024-05-18 LAB — PSA: PROSTATE SPECIFIC ANTIGEN: <0.04 ng/mL (ref 0.00–4.00)

## 2024-05-18 LAB — HEPATIC FUNCTION PANEL
ALBUMIN: 3.5 g/dL (ref 3.4–5.0)
ALKALINE PHOSPHATASE: 94 U/L (ref 46–116)
ALT (SGPT): 10 U/L (ref 10–49)
AST (SGOT): 15 U/L (ref <=34)
BILIRUBIN DIRECT: 0.2 mg/dL (ref 0.00–0.30)
BILIRUBIN TOTAL: 0.4 mg/dL (ref 0.3–1.2)
PROTEIN TOTAL: 6.4 g/dL (ref 5.7–8.2)

## 2024-05-18 LAB — POTASSIUM: POTASSIUM: 3.7 mmol/L (ref 3.5–5.1)

## 2024-05-18 MED ORDER — PREDNISONE 5 MG TABLET
ORAL_TABLET | Freq: Every day | ORAL | 11 refills | 30.00000 days | Status: CP
Start: 2024-05-18 — End: ?

## 2024-05-18 NOTE — Unmapped (Signed)
 Lab Results   Component Value Date    PSA <0.04 05/18/2024    PSA <0.04 02/15/2024    PSA <0.04 11/16/2023    PSA <0.04 09/15/2023    PSA <0.04 08/18/2023    PSA <0.04 05/18/2023    Please call [phone] to reach my nurse navigator Cloretta Leech for any issues.    For emergencies on Nights, Weekends and Holidays  Call [phone] for help.      Neysa Kras, MD, PhD  Professor of Medicine  Division of Oncology    [facility]  Genitourinary Oncology Clinic  Nurse Navigator: Cloretta Leech  Fax: [phone]

## 2024-05-19 ENCOUNTER — Ambulatory Visit
Admit: 2024-05-19 | Discharge: 2024-05-20 | Payer: Medicaid (Managed Care) | Attending: Student in an Organized Health Care Education/Training Program | Primary: Student in an Organized Health Care Education/Training Program

## 2024-05-19 LAB — TOXICOLOGY SCREEN, URINE
AMPHETAMINE SCREEN URINE: NEGATIVE
BARBITURATE SCREEN URINE: NEGATIVE
BENZODIAZEPINE SCREEN, URINE: NEGATIVE
BUPRENORPHINE, URINE SCREEN: NEGATIVE
CANNABINOID SCREEN URINE: POSITIVE — AB
COCAINE(METAB.)SCREEN, URINE: NEGATIVE
FENTANYL SCREEN, URINE: NEGATIVE
METHADONE SCREEN, URINE: NEGATIVE
OPIATE SCREEN URINE: NEGATIVE
OXYCODONE SCREEN URINE: POSITIVE — AB

## 2024-05-19 MED ORDER — BUPRENORPHINE 5 MCG/HOUR WEEKLY TRANSDERMAL PATCH
MEDICATED_PATCH | TRANSDERMAL | 0 refills | 28.00000 days | Status: CP
Start: 2024-05-19 — End: ?

## 2024-05-19 NOTE — Unmapped (Signed)
 OUTPATIENT ONCOLOGY PALLIATIVE CARE CONSULT NOTE    Patrick Montgomery is a 63 y.o. male who is seen in consultation at the request of Patrick Montgomery* for evaluation of Symptoms     Principal Diagnosis: Patrick Montgomery is a 63 y.o. male with prostate cancer, diagnosed in June 2023 now on ADT with Eliguard.  Disease sites include prostate, ?solitary bone metastasis.     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 STill has pain in same area that is most bothersome when lying down at night. 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 Now s/p pudendal nerve block with mild improvement.  Also describes a neuropathic burning sensation with radiation down leg.   - Patrick Montgomery decided not to follow-up with Patrick Montgomery and interventional team - I still would recommend this and told him that today  - Discussed that I do not think long-term full agonist opioids is a good idea for his pain, especially since we haven't exhausted alternatives. Discussed buprenorphine  patch as alternative strategy for chronic pain - will start buprenorphine  patch 5mcg/hr q7 days  - Continue wean of oxycodone , now q12 hrs PRN - #56 tablets this month, plan for #42 next month, and then 28 tablets, then 14 tablets, then off - Patrick Montgomery would prefer to stay on at least 3 tablets per day.   - s/p lyrica  to 150mg  BID  - Cont tylenol  1000 mg TID PRN  - Cont flexeril  10mg  nightly PRN  - Cont Lidocaine  ointment to tender area of skin  - UDS today    Prior Medications:   - MS contin  - didn't find effective.  Oxycodone  5 mg effective.  - Naproxen  BID - mild benefit  - Tylenol  - mild to no benefit    #Cancer-Related Fatigue: Stable. Still able to be active. Gets out of house daily.   - Encouraged light exercise as able    #Early Satiety: Improved and stable. Weight is relatively stable. Weight Change Past 6 Months in Pounds (rounded): -2.5 at 05/19/2024  2:34 PM    - If worsens, patinet open to nutrition referral in future  - CTM, possible marinol trial in future if appetite becomes issue    #Constipation: Stable.  Intermittent constipation increases pudendal nerve pressure. Senna recommended for regularity.  - cont senna to 2 tablet nightly - hold for loose stools  - cont MiraLAX  1-2 times daily PRN    Prostate Cancer  Well-managed with undetectable PSA. On Zytiga , monitor for gastrointestinal side effects.  - Mngt per onc    #Mood/Coping: Stable.  Good support from his wife, mother and children. Stays positive by nature. Says he would be perfect if not for lingering pain.   - Also shares some concerns about loss of libido - will discuss in more detail with onc team at next visit    #Goals of Care/Prognostic Awareness: Previously provided Patrick Montgomery with prepare for your care paperwork and introduced the concepts of CODE STATUS and the purpose of advance directives.  Patrick Montgomery appreciated having something to take at home and complete with his partner, but has not completed.         From prior convos:  - Knows he has prostate cancer and is followed by Patrick Montgomery.  Knows that recent PSA was good.  Understands that he is taking medicine through 2026 for this.  Unsure if his disease is curable or not and what the overall trajectory of his prognosis is, but would  like to know.  Is a type of person who would want to know details about his disease and prognosis going forward.  - Patrick Montgomery shares his understanding that he has metastatic prostate cancer. He does not recall much about what to expect from this cancer, including about prognosis. He knows that there are several treatment options, but is not sure whether it is curable or not.   - We asked whether he is the type of person who wants to know details about prognosis and he reports that he would like to know. He appreciates honest and direct communication. We said that we would discuss with oncology so that we can provide accurate communication.   - Finally, we asked who his preferred HCDM is and he says that it is his domestic partner, Patrick Montgomery. We discussed that she would not be default HCPOA by state law and recommended completing formal HCPOA paperwork to allow her to be his surrogate if needed in the future.     #Advance Care Planning:  HCDM:   HCDM (Patrick Montgomery stated preference): Patrick Montgomery - Domestic Partner [phone]  Natural surrogate decision maker: majority of adult children and living parents  Advance Directive: none on file - wants to do HCPOA      #Controlled substances risk management.   - Patrick Montgomery has a signed pain medication agreement with Outpatient Palliative Care, completed on 01/28/23, as per standard of care.   - [place]CC[place] database was reviewed today and it was appropriate.   - Urine drug screen was performed at this visit. Findings: periodic screening has been appropriate.   - Patrick Montgomery has received information about safe storage and administration of medications.   - Patrick Montgomery has received a prescription for narcan  and Patrick Montgomery is not applicable.     F/u: 6-8 weeks    ----------------------------------------    Referring Provider: Alyce Belvie Montgomery  Oncology Team: Patrick Montgomery  PCP: Patrick Alfonso HERO, FNP    Interval Hx, 05/19/24:    No major events since her last visit.  PSA continues to be undetectable.  Patrick Montgomery shares today that unfortunately the pain near his perineum continues.  He continues to be most bothersome at night and when lying flat.  He is finding the oxycodone  wean harder at this point, noting that three 5 mg tablets per day was better than 2 tablets/day.    He shares that he has not made a follow-up visit with interventional anesthesia.  We discussed that long-term opioid use for chronic pain is not the best evidence-based strategy and that I would want to explore all potential alternatives before committing to long-term opioid therapy.    No other acute concerns. No n/v or constipation. No fatigue or dyspnea. Appetite stable.       HPI: Patrick Montgomery is a 63 year old male with history of prostate cancer establishing with palliative care today primarily for pain control.    In terms of pain, Patrick Montgomery locates the pain just to the right of his rectum, sometimes with radiation down his leg and up his back. He describes the pain as a sharp and excruciating pain. He notes that it is constant. It is worse after moving around all day and worse when sitting down.  It feels better if he massages the area.  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    Palliative Performance Scale: 60% - Ambulation: Reduced / unable  to do hobby or some housework, significant disease / Self-Care:Occasional assist as necessary / Intake:Normal or reduced / Level of Conscious: Full or confusion    Social History:   Lives in [place], [place] with his domestic partner, Patrick (not officially married). They have an 18yo daughter. Also has a 26yo son who runs a comic book store. Mother is living and also a source of support.    Watches church every Sunday - [employer]. Also belongs to a bapt[employer] church.     Occupation: Retired house painter  Hobbies: Likes to race go-karts and cars. Likes to watch his nephew do this as well.     Objective     Hematology/Oncology History Overview Note   In 03/2022, seen in ER for gross hematuria, worsening LUTS. CT bladder showing bladder mass.  In 04/2022, PSA 18. Had cysto, prostate biopsy, TURP. Prostate biopsy showed Gleason 4+5=9 (GG 5), 10/12 cores positive for cancer. TURP specimen showed mixed acinar/ductal features, cribriborm morphology.  In 05/2022, CT and bone scan showed no pelvic or distant mets  In 06/2022, ADT started with Degarelix, continued with Eligard.  In 06/2022, PSMA PET showed avidity in R inferior pubic ramus, suspicious for single bone met.  In 07/2022, abiraterone/prednisone started.  In 08/2022, RT to prostate/SV/nodes and pubic ramus bone met site, completed in 12/2022.  In 05/2023, abiraterone held due to hypertension and hypokalemia. Resumed after correction     Prostate cancer      06/17/2022 Initial Diagnosis    Prostate cancer (CMS-HCC)     06/17/2022 -  Cancer Staged    Staging form: Prostate, AJCC 8th Edition  - Clinical: Stage IVB (cT4, cN0, cM1b, PSA: 18, Grade Group: 5) - Signed by Whang, Young E, MD on 02/16/2024       07/07/2022 - 07/07/2022 Endocrine/Hormone Therapy    OP DEGARELIX  Plan Provider: Young E Whang, MD     08/11/2022 Endocrine/Hormone Therapy    OP PROSTATE LEUPROLIDE (ELIGARD) 45 MG EVERY 6 MONTHS  Plan Provider: Young E Whang, MD     10 /10/2022 - 10/02/2023 Radiation    Radiation Therapy Treatment Details (09/10/2022 - 10/02/2023)  Site: Prostate  Technique: IMRT  Goal: No goal specified  Planned Treatment Start Date: No planned start date specified       REVIEW OF SYSTEMS:  A comprehensive review of 10 systems was negative except for pertinent positives noted in HPI.    PHYSICAL EXAM:   GEN: NAD, lying in bed  EYES: EOMI, anicteric sclera, no discharge  ENT: MMM, atraumatic   CV: no evidence of cyanosis  PULM: CTAB, No increased work of breathing  MSK: no sarcopenia  EXT: No LE edema  NEURO: AO x 3, No gross focal deficits, 5/5 strength throughout, no myoclonus  PSYCH: mood and affect appropriate, no agitation       I personally spent 40 minutes face-to-face and non-face-to-face in the care of this Patrick Montgomery, which includes all pre, intra, and post visit time on the date of service.  All documented time was specific to the E/M visit and does not include any procedures that may have been performed.      Patrick Gola, MD, MEd  [facility] Out[facility] Oncology Palliative Care

## 2024-05-19 NOTE — Unmapped (Addendum)
 Pt has Medicaid- they PREFER BRAND BUTRANS  patch- ensure pharmacy processes it for BRAND  No PA needed for BRAND>     Previously:  PA submitted for buprenorphine  patch 5 mcg #4/28 days    CMM initiated    [facility] Medicaid of [place]      APPROVED thru 11/29/2024- pharm aware

## 2024-05-30 NOTE — Unmapped (Signed)
 [facility] Refill Coordination Note    Specialty Medication(s) to be Shipped:   Hematology/Oncology: abiraterone  250 mg    Other medication(s) to be shipped: No additional medications requested for fill at this time     Patrick Montgomery, DOB: 07-04-1961  Phone: There are no phone numbers on file.      All above HIPAA information was verified with patient.     Was a Nurse, learning disability used for this call? No    Completed refill call assessment today to schedule patient's medication shipment from the [facility]  [phone]).  All relevant notes have been reviewed.     Specialty medication(s) and dose(s) confirmed: Regimen is correct and unchanged.   Changes to medications: Patrick Montgomery reports no changes at this time.  Changes to insurance: No  New side effects reported not previously addressed with a pharmacist or physician: None reported  Questions for the pharmacist: No    Confirmed patient received a Conservation officer, historic buildings and a Surveyor, mining with first shipment. The patient will receive a drug information handout for each medication shipped and additional FDA Medication Guides as required.       DISEASE/MEDICATION-SPECIFIC INFORMATION        N/A    SPECIALTY MEDICATION ADHERENCE     Medication Adherence    Patient reported X missed doses in the last month: 0  Specialty Medication: Abiraterone  250mg   Patient is on additional specialty medications: No  Informant: patient     Were doses missed due to medication being on hold? No    Abiraterone  250 mg: 10 days of medicine on hand       REFERRAL TO PHARMACIST     Referral to the pharmacist: Not needed      [facility]     Shipping address confirmed in Epic.     Cost and Payment: Unable to pay for medication    Delivery Scheduled: Yes, Expected medication delivery date: 06/06/24.     Medication will be delivered via Next Day Courier to the prescription address in Epic [place].    Patrick Montgomery   [facility]  Specialty Technician

## 2024-06-05 MED FILL — ABIRATERONE 250 MG TABLET: ORAL | 30 days supply | Qty: 120 | Fill #8

## 2024-06-07 NOTE — Unmapped (Signed)
 Patient Patrick Montgomery was called in attempt number 1 to reach them regarding rescheduling their upcoming appointments on 08/22/24.     The call resulted in: Voicemail full    Please reschedule the patient upon their return call on 06/07/24 for the following:     Other- Please reschedule patient to see JODEAN BROWN-BECKFORD as Dr.Whang is OOO    Thank you,    Alexus Erika

## 2024-06-09 DIAGNOSIS — C61 Malignant neoplasm of prostate: Principal | ICD-10-CM

## 2024-06-09 MED ORDER — OXYCODONE 5 MG TABLET
ORAL_TABLET | Freq: Two times a day (BID) | ORAL | 0 refills | 28.00000 days | Status: CP | PRN
Start: 2024-06-09 — End: ?

## 2024-06-09 NOTE — Unmapped (Signed)
 I spoke with patient Patrick Montgomery to confirm appointments on the following date(s): Patients appointment being rescheduled from Dr.Whang to [place]. Dr.Whang OOO    Alexus Erika
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
## 2024-07-05 NOTE — Unmapped (Signed)
 The [facility] has made a second and final attempt to reach this patient to refill the following medication:Abiraterone  250mg .      We have left voicemails on the following phone numbers: [phone], have been unable to leave messages on the following phone numbers: [phone], have sent a text message to the following phone numbers: [phone], and have sent a Mychart questionnaire..    Dates contacted: 7/31,8/6  Last scheduled delivery: 06/05/24    The patient may be at risk of non-compliance with this medication. The patient should call the [facility] at [phone]  Option 4, then Option 1: Oncology to refill medication.    Patrick Montgomery [facility] Specialty Technician

## 2024-07-09 DIAGNOSIS — C61 Malignant neoplasm of prostate: Principal | ICD-10-CM

## 2024-07-09 MED ORDER — OXYCODONE 5 MG TABLET
ORAL_TABLET | Freq: Two times a day (BID) | ORAL | 0 refills | 28.00000 days | PRN
Start: 2024-07-09 — End: ?

## 2024-07-10 MED ORDER — OXYCODONE 5 MG TABLET
ORAL_TABLET | Freq: Every day | ORAL | 0 refills | 42.00000 days | Status: CP | PRN
Start: 2024-07-10 — End: ?

## 2024-07-10 MED ORDER — BUPRENORPHINE 7.5 MCG/HOUR WEEKLY TRANSDERMAL PATCH
MEDICATED_PATCH | TRANSDERMAL | 0 refills | 28.00000 days | Status: CP
Start: 2024-07-10 — End: ?

## 2024-07-10 NOTE — Unmapped (Signed)
 Increasing dose of Buprenorphine  patch. Continuing oxycodone  wean.     Refill requested. PDMP reviewed and appropriate. Refill sent to preferred pharmacy.

## 2024-07-20 NOTE — Unmapped (Signed)
 [facility] Clinical Assessment & Refill Coordination Note    Patrick Montgomery, [place]: 1961-08-31  Phone: There are no phone numbers on file.    All above HIPAA information was verified with patient.     Was a Nurse, learning disability used for this call? No    Specialty Medication(s):   Hematology/Oncology: abiraterone  250 mg     Current Medications[1]     Changes to medications: Zaul Reports stopping the following medications: buprenorphine     Medication list has been reviewed and updated in Epic: Yes    Allergies[2]    Changes to allergies: No    Allergies have been reviewed and updated in Epic: Yes    SPECIALTY MEDICATION ADHERENCE     abiraterone  250 mg: 10 days of medicine on hand     Medication Adherence    Patient reported X missed doses in the last month: 1-2  Specialty Medication: Abiraterone  250 mg  Patient is on additional specialty medications: No  Informant: patient  Confirmed plan for next specialty medication refill: delivery by pharmacy  Refills needed for supportive medications: not needed          Specialty medication(s) dose(s) confirmed: Regimen is correct and unchanged.     Are there any concerns with adherence? No    Adherence counseling provided? Not needed    CLINICAL MANAGEMENT AND INTERVENTION      Clinical Benefit Assessment:    Do you feel the medicine is effective or helping your condition? Yes    Clinical Benefit counseling provided? Not needed    Adverse Effects Assessment:    Are you experiencing any side effects? Yes, patient reports experiencing constipation. See clinical intervention documentation: NA - is taking miralax  and milk of magnesia as needed    Are you experiencing difficulty administering your medicine? No    Quality of Life Assessment:    Quality of Life    Rheumatology  Oncology  1. What impact has your specialty medication had on the reduction of your daily pain or discomfort level?: None  2. On a scale of 1-10, how would you rate your ability to manage side effects associated with your specialty medication? (1=no issues, 10 = unable to take medication due to side effects): 2  Dermatology  Cystic Fibrosis          How many days over the past month did your prostate cancer  keep you from your normal activities? For example, brushing your teeth or getting up in the morning. 0    Have you discussed this with your provider? Not needed    Acute Infection Status:    Acute infections noted within Epic:  No active infections    Patient reported infection: None    Therapy Appropriateness:    Is therapy appropriate based on current medication list, adverse reactions, adherence, clinical benefit and progress toward achieving therapeutic goals? Yes, therapy is appropriate and should be continued     Clinical Intervention:    Was an intervention completed as part of this clinical assessment? No    DISEASE/MEDICATION-SPECIFIC INFORMATION      N/A    Oncology: Is the patient receiving adequate infection prevention treatment? Not applicable  Does the patient have adequate nutritional support? Not applicable    PATIENT SPECIFIC NEEDS     Does the patient have any physical, cognitive, or cultural barriers? No    Is the patient high risk? No    Does the patient require physician intervention or  other additional services (i.e., nutrition, smoking cessation, social work)? No    Does the patient have an additional or emergency contact listed in their chart? Yes    SOCIAL DETERMINANTS OF HEALTH     At the [facility], we have learned that life circumstances - like trouble affording food, housing, utilities, or transportation can affect the health of many of our patients.   That is why we wanted to ask: are you currently experiencing any life circumstances that are negatively impacting your health and/or quality of life? Patient declined to answer    Social Drivers of Health     Food Insecurity: Food Insecurity Present (03/08/2024)    Hunger Vital Sign     Worried About Running Out of Food in the Last Year: Sometimes true     Ran Out of Food in the Last Year: Often true   Tobacco Use: High Risk (05/18/2024)    Patient History     Smoking Tobacco Use: Every Day     Smokeless Tobacco Use: Never     Passive Exposure: Not on file   Transportation Needs: No Transportation Needs (03/08/2024)    PRAPARE - Transportation     Lack of Transportation (Medical): No     Lack of Transportation (Non-Medical): No   Alcohol Use: Not At Risk (03/08/2024)    Alcohol Use     How often do you have a drink containing alcohol?: Never     How many drinks containing alcohol do you have on a typical day when you are drinking?: 1 - 2     How often do you have 5 or more drinks on one occasion?: Never   Housing: Low Risk  (03/08/2024)    Housing     Within the past 12 months, have you ever stayed: outside, in a car, in a tent, in an overnight shelter, or temporarily in someone else's home (i.e. couch-surfing)?: No     Are you worried about losing your housing?: No   Physical Activity: Not on file   Utilities: High Risk (03/08/2024)    Utilities     Within the past 12 months, have you been unable to get utilities (heat, electricity) when it was really needed?: Yes   Stress: Not on file   Interpersonal Safety: Not At Risk (03/08/2024)    Interpersonal Safety     Unsafe Where You Currently Live: No     Physically Hurt by Anyone: No     Abused by Anyone: No   Substance Use: Not on file (10/10/2023)   Intimate Partner Violence: Not At Risk (05/06/2022)    Humiliation, Afraid, Rape, and Kick questionnaire     Fear of Current or Ex-Partner: No     Emotionally Abused: No     Physically Abused: No     Sexually Abused: No   Social Connections: Not on file   Financial Resource Strain: Low Risk  (05/06/2022)    Overall Financial Resource Strain (CARDIA)     Difficulty of Paying Living Expenses: Not hard at all   Health Literacy: Not on file   Internet Connectivity: No Internet connectivity concern identified (05/06/2022)    Internet Connectivity     Do you have access to internet services: Yes     How do you connect to the internet: Personal Device at home     Is your internet connection strong enough for you to watch video on your device without major problems?: Yes     Do  you have enough data to get through the month?: Yes     Does at least one of the devices have a camera that you can use for video chat?: Yes       Would you be willing to receive help with any of the needs that you have identified today? Not applicable       SHIPPING     Specialty Medication(s) to be Shipped:   Hematology/Oncology: abiraterone  250 mg    Other medication(s) to be shipped: No additional medications requested for fill at this time - discussed picking up prednisone  from local [facility]    Specialty Medications not needed at this time: N/A     Changes to insurance: No    Cost and Payment: Patient has a copay of $4. They are aware and have authorized the pharmacy to charge the credit card on file.    Delivery Scheduled: Yes, Expected medication delivery date: 07/25/24.     Medication will be delivered via Next Day Courier to the confirmed prescription address in [facility].    The patient will receive a drug information handout for each medication shipped and additional FDA Medication Guides as required.  Verified that patient has previously received a Conservation officer, historic buildings and a Surveyor, mining.    The patient or caregiver noted above participated in the development of this care plan and knows that they can request review of or adjustments to the care plan at any time.      All of the patient's questions and concerns have been addressed.    Aneita DELENA Merck, [facility]   [facility] Specialty Pharmacist       [1]   Current Outpatient Medications   Medication Sig Dispense Refill    abiraterone  (ZYTIGA ) 250 mg tablet Take 4 tablets (1,000 mg total) by mouth daily. 120 tablet 11    amlodipine  (NORVASC ) 10 MG tablet Take 1 tablet (10 mg total) by mouth daily. 30 tablet 11    aspirin  (ECOTRIN) 81 MG tablet Take 1 tablet (81 mg total) by mouth daily. 90 tablet 3    atorvastatin  (LIPITOR) 10 MG tablet Take 1 tablet (10 mg total) by mouth daily. 90 tablet 3    oxyCODONE  (ROXICODONE ) 5 MG immediate release tablet Take 1 tablet (5 mg total) by mouth daily as needed for pain. No more than 2 tablets per day. 42 tablet 0    polyethylene glycol (MIRALAX ) 17 gram packet Take 17 g by mouth daily. 60 packet 1    potassium chloride  (KLOR-CON ) 10 MEQ CR tablet TAKE 1 TABLET BY MOUTH EVERY DAY 90 tablet 1    predniSONE  (DELTASONE ) 5 MG tablet Take 1 tablet (5 mg total) by mouth daily. 30 tablet 11    buprenorphine  (BUTRANS ) 7.5 mcg/hour PTWK transdermal patch Place 1 patch on the skin every seven (7) days. (Patient not taking: Reported on 07/20/2024) 4 patch 0    naloxone  (NARCAN ) 4 mg nasal spray One spray in either nostril once for known/suspected opioid overdose. May repeat every 2-3 minutes in alternating nostril til EMS arrives 2 each 3    senna (SENOKOT) 8.6 mg tablet Take 1-2 tablets by mouth nightly. (Patient not taking: Reported on 07/20/2024) 60 tablet 2     No current facility-administered medications for this visit.   [2] No Known Allergies

## 2024-07-24 MED FILL — ABIRATERONE 250 MG TABLET: ORAL | 30 days supply | Qty: 120 | Fill #9

## 2024-08-09 DIAGNOSIS — C61 Malignant neoplasm of prostate: Principal | ICD-10-CM

## 2024-08-09 MED ORDER — OXYCODONE 5 MG TABLET
ORAL_TABLET | Freq: Every day | ORAL | 0 refills | 28.00000 days | Status: CP | PRN
Start: 2024-08-09 — End: ?

## 2024-08-10 DIAGNOSIS — E876 Hypokalemia: Principal | ICD-10-CM

## 2024-08-10 DIAGNOSIS — C61 Malignant neoplasm of prostate: Principal | ICD-10-CM

## 2024-08-10 MED ORDER — PREDNISONE 5 MG TABLET
ORAL_TABLET | Freq: Every day | ORAL | 11 refills | 30.00000 days
Start: 2024-08-10 — End: ?

## 2024-08-10 MED ORDER — POTASSIUM CHLORIDE ER 10 MEQ TABLET,EXTENDED RELEASE
ORAL_TABLET | Freq: Every day | ORAL | 1 refills | 90.00000 days
Start: 2024-08-10 — End: ?

## 2024-08-11 MED ORDER — PREDNISONE 5 MG TABLET
ORAL_TABLET | Freq: Every day | ORAL | 11 refills | 30.00000 days | Status: CP
Start: 2024-08-11 — End: ?

## 2024-08-14 MED ORDER — POTASSIUM CHLORIDE ER 10 MEQ TABLET,EXTENDED RELEASE
ORAL_TABLET | Freq: Every day | ORAL | 1 refills | 90.00000 days
Start: 2024-08-14 — End: ?

## 2024-08-17 NOTE — Unmapped (Signed)
 [facility] Visit Note    Patient Name: Patrick Montgomery  Patient Age: 63 y.o.  Encounter Date: 08/22/2024  Attending Provider:  Young E. Curt, MD  Referring physician: Curt Neysa BRAVO, MD    Assessment  Patient Active Problem List   Diagnosis    Tobacco dependence    Cerebrovascular accident (CVA) due to occlusion of right carotid artery    (CMS-HCC)    Anxiety    Skin cancer    Gout    History of hypertension    History of kidney stones    Hyperlipidemia with target LDL less than 70    Drug abuse in remission (CMS-HCC)    Vaccine refused by patient    Hematuria    Prostate cancer    (CMS-HCC)    History of nonmelanoma skin cancer    Pudendal neuralgia    Metastasis to bone    (CMS-HCC)     Prostate Cancer, oligometastatic disease (solitary bone met).    Patrick Montgomery is a 63 y.o. male who presents for management of newly diagnosed prostate cancer. He was initially seen in the ER In May 2023 for gross hematuria and  worsening LUTS. A CT scan of the bladder revealed a bladder mass. In June 2023, he was found to have a PSA of 18. He underwent cystoscopy, prostate biopsy, and TURP. Prostate biopsy showed Gleason 4+5=9 (GG 5), 10/12 cores positive for cancer. TURP specimen showed mixed acinar/ductal features, cribriborm morphology. In July 2023, CT and bone scan showed no pelvic or distant metastases.    On 06/18/22, we discussed management of his recent diagnosis of prostate cancer. Given that he has been diagnosed with prostate cancer at a very young age, I informed the patient that there may be features that indicate a higher risk of hereditary prostate cancer. 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   Based on current imaging, his cancer is confined to the prostate. A PSMA PET scan will be ordered to see if the cancer has metastasized.     On 07/07/2022, pt is now ready to move ahead with treatment. Will proceed with ADT plus abiraterone  for two years, per STAMPEDE.  PSMA PET still undergoing authorization.    On 08/11/2022, most recent PSA on 08/01/2022 is 0.27. PSMA PET scan from 07/27/2022 showed avidity involving the right inferior pubic ramus which is suspicious for single site of osseous metastatic disease. Will proceed with Eligard  shot and proceed with radiation with Dr. Pearlstein. May consider radiating single site of metastatic disease at the right inferior pubic ramus     On 12/03/2022, PSA <0.04, on ADT plus abi, s/p RT to all involved sites. For pain, unlikely to be from cancer, may benefit from pain clinic management long-term.    On 02/16/23, PSA remains <0.04. Received ADT today and will continue abiraterone /prednisone . BP mildly elevated at 146/81, will monitor. Chronic pain being addressed with support from palliative care team. Will RTC in 3 months.    On 05/18/2023, PSA remains undetectable. BP elevated at 166/72, potassium 3.2 today. Hold abiraterone  for now, will have CPP Izetta Search, Pharm D follow up on these and restart abi (with or without dose reduction).     On 08/18/23, PSA remains undetectable. His BP improved from last visit at 151/74, K 4.4 and still remains on potassium supplement. He remains off of abiraterone . Has not checked BP at home. He is due for Eligard  today. Given  elevated BP, will increase amlodipine  to 10 mg for BP management and restart abiraterone  and continue prednisone .    On 11/16/2023, PSA remains undetectable. Continue current course of treatment.     On 02/15/2024, PSA remains undetectable. Re-discussed importance of taking Prednisone  while on Abiraterone . Continue current course of treatment.      Visit today 08/22/2024, PSA remains undetectable. Significant side effects from abiraterone , including headaches, GI symptoms. Stopped Abi/prednisone  given he's at the 2 year mark.    Plan:  Continue ADT with Eligard  given today 08/22/2024 due on or after 02/20/2024. (Anticipate 3 years of therapy; until 8-07/2025)  - Follow PSA  Stopped Abiraterone  and prednisone  (competed 2 years as of 08/22/2024)  Potassium chloride  discontinued   Follow-up BP   3. NGS returned without actionable mutations, germline testing negative on [facility] normal  4. Follow up with palliative care as scheduled  4. RTC in 3 months    Assessment/Plan:     Prostate cancer with oligometastatic disease   Prostate cancer under treatment with abiraterone  ~22 months. PSA undetectable, indicating good disease control. Significant side effects from abiraterone , including headaches, GI symptoms. Discussed stopping abiraterone  per stampede trial given he is now at the 2 year mark.  - Stop abiraterone  and prednisone    - Continue ADT with Eligard  for another year; given today  - Monitor PSA levels regularly.  - Follow-up in 3 months.    Radiation-induced sacral pain  Chronic neuropathic sacral pain post-radiation therapy. Reports of fracture of coccyx post xRT. Pain management challenging, previous nerve block attempts unsuccessful.  - Continue pain management strategies.  - Follow up with Doctor Polly for pain management and further evaluation.    Headache, possibly migraine, likely medication-related  Severe headaches, possibly migraines, suspected to be related to abiraterone  use. Reports some mild photophobia at times and severe, persistent headaches.  - Monitor headache symptoms after stopping abiraterone .  - Check blood pressure regularly at home.  - Follow up on headache symptoms in the next visit.    Hypertension  BP elevated 179/85 mmHg, reported he did not take antihypertensive today and breakfast was salty.   - Continue Amlodipine    - Check blood pressure at home for the next few days.  - Follow up on blood pressure readings on Friday.       I personally spent 40 minutes face-to-face and non-face-to-face in the care of this patient, which includes all pre, intra, and post visit time on the date of service.  All documented time was specific to the E/M visit and does not include any procedures that may have been performed.     Reason for Visit  Follow up of prostate cancer.     History of Present Illness:  Hematology/Oncology History Overview Note   In 03/2022, seen in ER for gross hematuria, worsening LUTS. CT bladder showing bladder mass.  In 04/2022, PSA 18. Had cysto, prostate biopsy, TURP. Prostate biopsy showed Gleason 4+5=9 (GG 5), 10/12 cores positive for cancer. TURP specimen showed mixed acinar/ductal features, cribriborm morphology.  In 05/2022, CT and bone scan showed no pelvic or distant mets  In 06/2022, ADT started with Degarelix, continued with Eligard .  In 06/2022, PSMA PET showed avidity in R inferior pubic ramus, suspicious for single bone met.  In 07/2022, abiraterone /prednisone  started.  In 08/2022, RT to prostate/SV/nodes and pubic ramus bone met site, completed in 12/2022.  In 05/2023, abiraterone  held due to hypertension and hypokalemia. Resumed after correction     Prostate cancer    (  CMS-HCC)   06/17/2022 Initial Diagnosis    Prostate cancer (CMS-HCC)     06/17/2022 -  Cancer Staged    Staging form: Prostate, AJCC 8th Edition  - Clinical: Stage IVB (cT4, cN0, cM1b, PSA: 18, Grade Group: 5) - Signed by Curt Neysa BRAVO, MD on 02/16/2024       07/07/2022 - 07/07/2022 Endocrine/Hormone Therapy    OP DEGARELIX  Plan Provider: Neysa BRAVO Curt, MD     08/11/2022 Endocrine/Hormone Therapy    OP PROSTATE LEUPROLIDE  (ELIGARD ) 45 MG EVERY 6 MONTHS  Plan Provider: Neysa BRAVO Curt, MD     09/10/2022 - 10/02/2023 Radiation    Radiation Therapy Treatment Details (09/10/2022 - 10/02/2023)  Site: Prostate  Technique: IMRT  Goal: No goal specified  Planned Treatment Start Date: No planned start date specified       Interval history 08/22/2024  History of Present Illness    Patrick Montgomery is a 63 year old male with prostate cancer and oligometastatic disease who presents for follow-up and Eligard  administration.    He experiences severe headaches, described as migraines, which started approximately one month ago. These headaches are some of the worst he has ever had and occur with possible photophobia. He thinks the pills (abiraterone ) might be causing his headaches.    He reports gastrointestinal discomfort, describing a burning sensation in his stomach, which he attributes to prior radiation treatment. He experiences this sensation frequently and likens it to a 'suntan' burn.    He has a history of radiation treatment that resulted in a fractured tailbone, causing significant pain and nerve issues. He experiences pain radiating down his legs and occasional difficulty walking, but denies any bowel or urinary incontinence. He also reports increased gas production.    He has not taken abiraterone  for the past two days.     Allergies:  No Known Allergies    Current Medications:    Current Outpatient Medications:     amlodipine  (NORVASC ) 10 MG tablet, Take 1 tablet (10 mg total) by mouth daily., Disp: 30 tablet, Rfl: 11    aspirin  (ECOTRIN) 81 MG tablet, Take 1 tablet (81 mg total) by mouth daily., Disp: 90 tablet, Rfl: 3    atorvastatin  (LIPITOR) 10 MG tablet, Take 1 tablet (10 mg total) by mouth daily., Disp: 90 tablet, Rfl: 3    naloxone  (NARCAN ) 4 mg nasal spray, One spray in either nostril once for known/suspected opioid overdose. May repeat every 2-3 minutes in alternating nostril til EMS arrives, Disp: 2 each, Rfl: 3    polyethylene glycol (MIRALAX ) 17 gram packet, Take 17 g by mouth daily., Disp: 60 packet, Rfl: 1    buprenorphine  (BUTRANS ) 5 mcg/hour PTWK transdermal patch, Place 1 patch on the skin every seven (7) days., Disp: 4 patch, Rfl: 0    oxyCODONE  (ROXICODONE ) 5 MG immediate release tablet, Take 1 tablet (5 mg total) by mouth two (2) times a day as needed for pain., Disp: 56 tablet, Rfl: 0  No current facility-administered medications for this visit.    Past Medical History and Social History  Past Medical History:   Diagnosis Date    Anxiety     Arthritis     Basal cell carcinoma Cancer    (CMS-HCC)     skin    Gout     Kidney stones     Squamous cell skin cancer     Stroke    (CMS-HCC)       Past Surgical History:  Procedure Laterality Date    PR BIOPSY OF PROSTATE,NEEDLE/PUNCH N/A 05/21/2022    Procedure: IMAGE GUIDED BIOPSY, PROSTATE; NEEDLE OR PUNCH, SINGLE OR MULTIPLE, ANY APPROACH;  Surgeon: Alm Bettyann Louder, MD;  Location: [facility] OR [facility];  Service: Urology    PR TRANSURETHRAL ELEC-SURG PROSTATECTOM N/A 05/21/2022    Procedure: TRANSURETHRAL ELECTROSURGICAL RESECT PROSTATE, W/CONTORL POSTOP BLEED, COMPLETE (VASECT, MEATOT, CYSTO ETC);  Surgeon: Alm Bettyann Louder, MD;  Location: [facility];  Service: Urology    SKIN BIOPSY      skin cancer removal           Social History     Occupational History    Not on file   Tobacco Use    Smoking status: Every Day     Current packs/day: 1.50     Average packs/day: 1.5 packs/day for 45.0 years (67.5 ttl pk-yrs)     Types: Cigarettes    Smokeless tobacco: Never   Vaping Use    Vaping status: Never Used   Substance and Sexual Activity    Alcohol use: No     Comment: socially     Drug use: Yes     Types: Marijuana    Sexual activity: Not Currently     Partners: Female     Birth control/protection: None       Family History  Family History   Problem Relation Age of Onset    Cancer Father         Skin    Melanoma Father     Squamous cell carcinoma Father     Rashes / Skin problems Father     Cancer Brother         skin     Substance Abuse Disorder Neg Hx     Basal cell carcinoma Neg Hx      Prostate Cancer Family History Assessment:  History of cancer in children (yes/no; if yes, what type AND age of diagnosis): No  Total number of siblings (including deceased): 2  History of cancer in siblings (yes/no; if yes, provide relation, type of cancer, AND age of diagnosis): Brother superficial basal cell; age unknown  History of cancer in parents (yes/no; if yes, please specify parent, type of cancer, AND age of diagnosis): Father superficial basal cell carcinoma; age unknown  History of cancer in aunts/uncles/grandparents (yes/no; if yes, provide relation, type of cancer, AND age of diagnosis): No      Review of Systems:  A comprehensive review of 10 systems was negative except for pertinent positives noted in HPI.    Physical Exam:    VITAL SIGNS:  BP 173/83  - Pulse 52  - Temp 36.6 ??C (97.9 ??F) (Oral)  - Resp 16  - Ht 167.6 cm (5' 6)  - Wt 57.2 kg (126 lb 3.2 oz)  - SpO2 99%  - BMI 20.37 kg/m??   ECOG Performance Status: 1  Physical Exam    GENERAL: Alert, cooperative, well developed, no acute distress.  HEENT: Normocephalic, normal oropharynx, moist mucous membranes.  CHEST: Clear to auscultation bilaterally, no wheezes, rhonchi, or crackles.  CARDIOVASCULAR: Normal heart rate and rhythm, S1 and S2 normal without murmurs.  ABDOMEN: Soft, non-tender, non-distended, without organomegaly, normal bowel sounds.  EXTREMITIES: No cyanosis or edema.  PSYCHIATRIC: Alert and oriented.  Normal mood and affect.  NEUROLOGICAL: Cranial nerves grossly intact, moves all extremities without gross motor or sensory deficit.   SKIN: Skin is warm, dry, and intact.    Results/Orders:  Lab on 08/22/2024   Component Date Value Ref Range Status    Potassium 08/22/2024 4.1  3.5 - 5.1 mmol/L Final    PSA 08/22/2024 <0.04  0.00 - 4.00 ng/mL Final    Albumin 08/22/2024 3.8  3.4 - 5.0 g/dL Final    Total Protein 08/22/2024 6.9  5.7 - 8.2 g/dL Final    Total Bilirubin 08/22/2024 0.4  0.3 - 1.2 mg/dL Final    Bilirubin, Direct 08/22/2024 0.10  0.00 - 0.30 mg/dL Final    AST 90/76/7974 15  <=34 U/L Final    ALT 08/22/2024 11  10 - 49 U/L Final    Alkaline Phosphatase 08/22/2024 97  46 - 116 U/L Final       Lab Results   Component Value Date    PSA <0.04 08/22/2024    PSA <0.04 05/18/2024    PSA <0.04 02/15/2024    PSA <0.04 11/16/2023    PSA <0.04 09/15/2023    PSA <0.04 08/18/2023    PSA <0.04 05/18/2023    PSA <0.04 02/16/2023    PSA <0.04 01/07/2023    PSA <0.04 12/03/2022         Orders placed or performed in visit on 08/22/24    PSA (Prostate Specific Antigen)    Comprehensive Metabolic Panel    Clinic Appointment Request Physician, Lab    Clinic Appointment Request Physician       Molecular Pathology  Tumor mutation profiling  [facility]: ZMYM3 mutation, TMB 3.7, MSS, no actionable mutations    Germline testing  Negative on [facility] normal draw    Pathology:  A: Prostate, right apex, core biopsy  - Prostatic adenocarcinoma, Gleason score 4 + 4 = 8 (Grade group 4) involving 2 of 2 cores, approximately 4 and 1 mm in linear extent, approximately 20% of total core length.      B: Prostate, left apex, core biopsy  - Prostatic adenocarcinoma, Gleason score 4 + 4 = 8 (Grade group 4) involving 2 of 2 cores, approximately 7 and 5 mm in linear extent, approximately 60% of total core length.   - Perineural invasion identified in this case.      C: Prostate, left mid, core biopsy  - Prostatic adenocarcinoma, Gleason score 4 + 5 = 9 (Grade group 5) involving 2 of 2 cores, approximately 6 and 3mm in linear extent, approximately 90% of total core length.      D: Prostate, right mid, core biopsy  - Prostatic adenocarcinoma, Gleason score 4 + 5 = 9 (Grade group 5) involving 2 of 2 cores, approximately 6 and 5 mm in linear extent, approximately 70% of total core length.      E: Prostate, right base, core biopsy  - Benign prostate tissue.      F: Prostate, left base, core biopsy  - Prostatic adenocarcinoma, Gleason score 4 + 4 = 8 (Grade group 4)  involving 2 of 2 cores, approximately 3 and 1 mm in linear extent, approximately 20% of total core length.      G: Bladder neck and bilateral median lobe, transurethral resection  - Involved by prostatic adenocarcinoma with mixed acinar and ductal features, Gleason score 4 + 4 = 8 (Grade group 4)    - Lymphovascular invasion identified     The pattern 4 in this case demonstrates cribriform morphology.      Imaging results:    CT Chest w Contrast 06/11/22:   No intrathoracic metastasis.       CT Abdomen  Pelvis W Contrast 06/11/22:     Enlarged irregular prostate correlates with history of biopsy-proven prostate cancer. There is redemonstrated irregularity of the posterior bladder and increased irregularity of the prostate at the bladder margin compared to prior may be secondary to tumor versus postsurgical/post TURP change. Consider attention on follow-up versus further evaluation with PSMA PET scan.       Questionable sclerotic changes of the right inferior pubic ramus are indeterminate and may represent normal variation although metastatic osseous disease is not excluded. Attention on follow-up bone scan. There is no other evidence of metastatic disease in the abdomen/pelvis.       Additional chronic and incidental findings as described.       Please see same day CT chest report for detailed findings above the diaphragm     NM Bone Scan Whole Body 06/11/22  No findings suggestive of osseous metastatic disease     PET CT 07/27/2022:   Impression   1.Ill-defined tracer avidity associated with the prostate, left greater than right, likely representing biopsy-proven prostate carcinoma.   2.Moderate avidity involving the right inferior pubic ramus which is suspicious for single site of osseous metastatic disease. Otherwise, no definite evidence of additional metastatic disease. More specifically, no avid adenopathy. Subcentimeter pulmonary nodules which are below the resolution of PET/CT and require continued attention on follow-up.     CT CAP   Result Date: 12/15/2022  Impression:   --Mild rectosigmoid wall thickening without surrounding inflammatory changes, favored secondary to radiation changes, with additional imaging findings suggestive of slow bowel transit.   --Increased conspicuity of right inferior pubic ramus mixed lytic sclerotic lesion which may reflect posttreatment changes versus worsening site of disease. No new osseous metastases.     MRI Pelvis Wo Contrast  Result Date: 09/16/2023  Impression:   Poor visualization of the  pudendal and perineal nerves bilaterally.      There is however, extensive post radiation changes of the right-sided adductor and levator ani musculature surrounding the treated lesion within the right inferior pubic rami. These changes correspond to the expected location of anterior distribution of the above-mentioned nerves which indicate the possibility of of its involvement by post radiation damage or mass effect plus or minus scarring as a possible etiology of the patient's symptoms.

## 2024-08-22 ENCOUNTER — Ambulatory Visit: Admit: 2024-08-22 | Discharge: 2024-08-22 | Payer: Medicaid (Managed Care)

## 2024-08-22 ENCOUNTER — Ambulatory Visit
Admit: 2024-08-22 | Discharge: 2024-08-22 | Payer: Medicaid (Managed Care) | Attending: Student in an Organized Health Care Education/Training Program | Primary: Student in an Organized Health Care Education/Training Program

## 2024-08-22 ENCOUNTER — Other Ambulatory Visit: Admit: 2024-08-22 | Discharge: 2024-08-22 | Payer: Medicaid (Managed Care)

## 2024-08-22 DIAGNOSIS — G893 Neoplasm related pain (acute) (chronic): Principal | ICD-10-CM

## 2024-08-22 DIAGNOSIS — C61 Malignant neoplasm of prostate: Principal | ICD-10-CM

## 2024-08-22 DIAGNOSIS — Z79899 Other long term (current) drug therapy: Principal | ICD-10-CM

## 2024-08-22 DIAGNOSIS — R530 Neoplastic (malignant) related fatigue: Principal | ICD-10-CM

## 2024-08-22 DIAGNOSIS — R634 Abnormal weight loss: Principal | ICD-10-CM

## 2024-08-22 DIAGNOSIS — Z515 Encounter for palliative care: Principal | ICD-10-CM

## 2024-08-22 LAB — HEPATIC FUNCTION PANEL
ALBUMIN: 3.8 g/dL (ref 3.4–5.0)
ALKALINE PHOSPHATASE: 97 U/L (ref 46–116)
ALT (SGPT): 11 U/L (ref 10–49)
AST (SGOT): 15 U/L (ref <=34)
BILIRUBIN DIRECT: 0.1 mg/dL (ref 0.00–0.30)
BILIRUBIN TOTAL: 0.4 mg/dL (ref 0.3–1.2)
PROTEIN TOTAL: 6.9 g/dL (ref 5.7–8.2)

## 2024-08-22 LAB — PSA: PROSTATE SPECIFIC ANTIGEN: <0.04 ng/mL (ref 0.00–4.00)

## 2024-08-22 LAB — POTASSIUM: POTASSIUM: 4.1 mmol/L (ref 3.5–5.1)

## 2024-08-22 MED ORDER — BUPRENORPHINE 5 MCG/HOUR WEEKLY TRANSDERMAL PATCH
MEDICATED_PATCH | TRANSDERMAL | 0 refills | 28.00000 days | Status: CP
Start: 2024-08-22 — End: ?

## 2024-08-22 MED ORDER — OXYCODONE 5 MG TABLET
ORAL_TABLET | Freq: Two times a day (BID) | ORAL | 0 refills | 28.00000 days | Status: CP | PRN
Start: 2024-08-22 — End: ?

## 2024-08-22 MED ADMIN — leuprolide acetate (6 month) (ELIGARD) injection 45 mg: 45 mg | SUBCUTANEOUS | @ 15:00:00 | Stop: 2024-08-22

## 2024-08-22 NOTE — Unmapped (Signed)
 Pt tolerated Eligard injection in Left lower abdomen without difficulty.  Pt was stable at discharge via self ambulation with steady gait.

## 2024-08-22 NOTE — Unmapped (Addendum)
 VISIT SUMMARY:  Today, you had a follow-up appointment to discuss your prostate cancer treatment and other health concerns. We reviewed your current medications, symptoms, and made adjustments to your treatment plan.    YOUR PLAN:  -PROSTATE CANCER WITH OLIGOMETASTATIC DISEASE: Your prostate cancer is currently well-controlled with undetectable PSA levels. We will scontinue your androgen deprivation therapy (ADT) for another year. Regular monitoring of your PSA levels will be necessary. This plan is based on a treatment strategy that suggests a reduced risk of progression. STOP TAKING YOUR ABIRATERONE  AND PREDNISONE .     -FRACTURE OF COCCYX WITH CHRONIC PAIN AND NERVE INJURY: You have chronic pain and nerve issues from a fractured tailbone due to metastatic prostate cancer. Pain management has been challenging, and previous nerve block attempts were not successful. We will continue with your current pain management strategies and you should follow up with Doctor Polly for further evaluation.    -HEADACHE, POSSIBLY MIGRAINE: You are experiencing severe headaches that may be migraines, likely related to your abiraterone  medication. We will monitor your headache symptoms after you stop taking abiraterone . Please check your blood pressure regularly at home and report any changes in your headache symptoms at your next visit.    -HYPERTENSION: Your blood pressure may be elevated, possibly due to abiraterone . Please check your blood pressure at home for the next few days and follow up with us  on Friday with your readings.    -GASTROINTESTINAL SYMPTOMS: You are experiencing abdominal pain and increased gas, which may be related to your previous radiation treatment. Please discuss these symptoms with Doctor Polly during your follow-up visit.    INSTRUCTIONS:  Please stop taking abiraterone  at the end of September. Continue your androgen deprivation therapy (ADT) for another year. Monitor your PSA levels regularly and check your blood pressure at home for the next few days. Follow up with Doctor Curt in three months and Doctor Polly for pain management and gastrointestinal symptoms. Report your blood pressure readings to us  on Friday.   - STOP TAKING YOUR ABIRATERONE  AND PREDNISONE      Lab Results   Component Value Date    PSA <0.04 08/22/2024    PSA <0.04 05/18/2024    PSA <0.04 02/15/2024    PSA <0.04 11/16/2023    PSA <0.04 09/15/2023    PSA <0.04 08/18/2023    PSA <0.04 05/18/2023    PSA <0.04 02/16/2023    PSA <0.04 01/07/2023    PSA <0.04 12/03/2022    PSA <0.04 10/30/2022    PSA <0.04 10/16/2022

## 2024-08-22 NOTE — Unmapped (Signed)
 OUTPATIENT ONCOLOGY PALLIATIVE CARE CONSULT NOTE    Patrick Montgomery is a 63 y.o. male who is seen in consultation at the request of Patrick Montgomery* for evaluation of Symptoms     Principal Diagnosis: Patrick Montgomery is a 63 y.o. male with prostate cancer, diagnosed in June 2023 now on ADT with Eliguard.  Disease sites include prostate, ?solitary bone metastasis.     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 STill has pain in same area that is most bothersome when lying down at night and not tolerating oxycodone  wean. 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 Now s/p pudendal nerve block with mild improvement.  Also describes a neuropathic burning sensation with radiation down leg.   - Re-Refer to  Patrick Montgomery and interventional team - he is now willing to schedule with Patrick Montgomery  - Discussed that I do not think long-term full agonist opioids is a good idea for his pain, especially since we haven't exhausted alternatives. I shared that I am only willing to continue prescribing oxycodone  if also utilizing buprenorphine  patch to which he is agreeable  - start buprenorphine  patch 5mcg/hr q7 days  - Pause wean of oxycodone , now q12 hrs PRN - #56 tablets this month, patient would prefer to stay on at least 3 tablets per day so will need to reassess after above interventions  - s/p lyrica  to 150mg  BID  - Cont tylenol  1000 mg TID PRN  - Cont flexeril  10mg  nightly PRN  - Cont Lidocaine  ointment to tender area of skin  - UDS today    Prior Medications:   - MS contin  - didn't find effective.  Oxycodone  5 mg effective.  - Naproxen  BID - mild benefit  - Tylenol  - mild to no benefit  - Gabapentin , lyrica  without clear efficacy     #Cancer-Related Fatigue: Stable. Still able to be active. Gets out of house daily.   - Encouraged light exercise as able    #Weight Loss: Worse. Eating well, but losing weight. 6lb down since last visit.   - onc is now stopping abiraterone  which may help  - CTM    #Constipation: Stable, but still occurs intermittently.  Intermittent constipation increases pudendal nerve pressure. Senna recommended for regularity.  - cont senna to 2 tablet nightly - hold for loose stools  - cont MiraLAX  1-2 times daily PRN    Prostate Cancer  Well-managed with undetectable PSA.   - Mngt per onc    #Mood/Coping: Stable. A little frustrated that pain persists. Good support from his wife, mother and children. Stays positive by nature. Says he would be perfect if not for lingering pain.   - Also shares some concerns about loss of libido - will discuss in more detail with onc team at next visit    #Goals of Care/Prognostic Awareness: Previously provided patient with prepare for your care paperwork and introduced the concepts of CODE STATUS and the purpose of advance directives.  Patient appreciated having something to take at home and complete with his partner, but has not completed.         From prior convos:  - Knows he has prostate cancer and is followed by Patrick Montgomery.  Knows that recent PSA was good.  Understands that he is taking medicine through 2026 for this.  Unsure if his disease is curable or not and what the overall trajectory of his prognosis is, but would like to know.  Is a type  of person who would want to know details about his disease and prognosis going forward.  - Patient shares his understanding that he has metastatic prostate cancer. He does not recall much about what to expect from this cancer, including about prognosis. He knows that there are several treatment options, but is not sure whether it is curable or not.   - We asked whether he is the type of person who wants to know details about prognosis and he reports that he would like to know. He appreciates honest and direct communication. We said that we would discuss with oncology so that we can provide accurate communication.   - Finally, we asked who his preferred HCDM is and he says that it is his domestic partner, Patrick Montgomery. We discussed that she would not be default HCPOA by state law and recommended completing formal HCPOA paperwork to allow her to be his surrogate if needed in the future.     #Advance Care Planning:  HCDM:   HCDM (patient stated preference): Patrick Montgomery - Domestic Partner [phone]  Natural surrogate decision maker: majority of adult children and living parents  Advance Directive: none on file - wants to do HCPOA      #Controlled substances risk management.   - Patient has a signed pain medication agreement with Outpatient Palliative Care, completed on 01/28/23, as per standard of care.   - [place] database was reviewed today and it was appropriate.   - Urine drug screen was performed at this visit. Findings: periodic screening has been appropriate.   - Patient has received information about safe storage and administration of medications.   - Patient has received a prescription for narcan  and patient is not applicable.     F/u: 6-8 weeks    ----------------------------------------    Referring Provider: Alyce Belvie Montgomery  Oncology Team: Patrick Montgomery  PCP: Patrick Montgomery HERO, FNP    Interval Hx, 08/22/24:      No major events since her last visit.  PSA continues to be undetectable.  Patient shares today that unfortunately the pain near his perineum continues and is worse. HE hasn't been able to tolerate weaning oxycodone , noting that he really needs 3 per day, but is only eligible for 1 per day. He also said he stopped using the bup patch but can't reminder why. Open to restarting at lowest dose.     He said he is now willing ot consider interventional anesthesia again. We discussed that long-term opioid use for chronic pain is not the best evidence-based strategy and that I would need us  to explore all potential alternatives before committing to long-term opioid therapy and that long-term opioid therapy would need to include buprenorphine .     On ROS, also notes weight loss despite eating well.     No other acute concerns. No n/v or constipation. No fatigue or dyspnea.\    HPI: Patrick Montgomery is a 63 year old male with history of prostate cancer establishing with palliative care today primarily for pain control.    In terms of pain, patient locates the pain just to the right of his rectum, sometimes with radiation down his leg and up his back. He describes the pain as a sharp and excruciating pain. He notes that it is constant. It is worse after moving around all day and worse when sitting down.  It feels better if he massages the area.  He used to work as a Education administrator for 40 years, but has not been able to due  to this pain (as well as right rotator cuff issues).    Palliative Performance Scale: 60% - Ambulation: Reduced / unable to do hobby or some housework, significant disease / Self-Care:Occasional assist as necessary / Intake:Normal or reduced / Level of Conscious: Full or confusion    Social History:   Lives in [place], [place] with his domestic partner, Patrick (not officially married). They have an 18yo daughter. Also has a 26yo son who runs a comic book store. Mother is living and also a source of support.    Watches church every Sunday - [employer]. Also belongs to a [employer] church.     Occupation: Retired house painter  Hobbies: Likes to race go-karts and cars. Likes to watch his nephew do this as well.     Objective     Hematology/Oncology History Overview Note   In 03/2022, seen in ER for gross hematuria, worsening LUTS. CT bladder showing bladder mass.  In 04/2022, PSA 18. Had cysto, prostate biopsy, TURP. Prostate biopsy showed Gleason 4+5=9 (GG 5), 10/12 cores positive for cancer. TURP specimen showed mixed acinar/ductal features, cribriborm morphology.  In 05/2022, CT and bone scan showed no pelvic or distant mets  In 06/2022, ADT started with Degarelix, continued with Eligard.  In 06/2022, PSMA PET showed avidity in R inferior pubic ramus, suspicious for single bone met.  In 07/2022, abiraterone/prednisone started.  In 08/2022, RT to prostate/SV/nodes and pubic ramus bone met site, completed in 12/2022.  In 05/2023, abiraterone held due to hypertension and hypokalemia. Resumed after correction     Prostate cancer    (CMS-HCC)   06/17/2022 Initial Diagnosis    Prostate cancer (CMS-HCC)     06/17/2022 -  Cancer Staged    Staging form: Prostate, AJCC 8th Edition  - Clinical: Stage IVB (cT4, cN0, cM1b, PSA: 18, Grade Group: 5) - Signed by Whang, Young E, MD on 02/16/2024       07/07/2022 - 07/07/2022 Endocrine/Hormone Therapy    OP DEGARELIX  Plan Provider: Young E Whang, MD     08/11/2022 Endocrine/Hormone Therapy    OP PROSTATE LEUPROLIDE (ELIGARD) 45 MG EVERY 6 MONTHS  Plan Provider: Young E Whang, MD     10 /10/2022 - 10/02/2023 Radiation    Radiation Therapy Treatment Details (09/10/2022 - 10/02/2023)  Site: Prostate  Technique: IMRT  Goal: No goal specified  Planned Treatment Start Date: No planned start date specified       REVIEW OF SYSTEMS:  A comprehensive review of 10 systems was negative except for pertinent positives noted in HPI.    PHYSICAL EXAM:   GEN: NAD  CV: no evidence of cyanosis  PULM: CTAB, No increased work of breathing  MSK: mild to moderate sarcopenia  EXT: No LE edema  NEURO: AO x 3, No gross focal deficits  PSYCH: mood and affect appropriate, no agitation       I personally spent 45 minutes face-to-face and non-face-to-face in the care of this patient, which includes all pre, intra, and post visit time on the date of service.  All documented time was specific to the E/M visit and does not include any procedures that may have been performed.      Patrick Gola, MD, MEd  [facility]iative [facility]

## 2024-08-24 NOTE — Unmapped (Signed)
 Patrick Montgomery has been contacted in regards to their refill of Abiraterone . At this time, they have declined refill due to MD has discontinued Abiraterone  250mg .

## 2024-09-01 NOTE — Unmapped (Signed)
 Specialty Medication(s): abiraterone     [name] has been dis-enrolled from the [facility] specialty pharmacy services as a result of no longer taking medication (no further information was provided).    Additional information provided to the patient: NA    Patrick Montgomery, [facility]  [facility] Specialty Pharmacist

## 2024-09-06 ENCOUNTER — Ambulatory Visit
Admit: 2024-09-06 | Discharge: 2024-09-07 | Payer: Medicaid (Managed Care) | Attending: Student in an Organized Health Care Education/Training Program | Primary: Student in an Organized Health Care Education/Training Program

## 2024-09-06 DIAGNOSIS — G893 Neoplasm related pain (acute) (chronic): Principal | ICD-10-CM

## 2024-09-06 DIAGNOSIS — G894 Chronic pain syndrome: Principal | ICD-10-CM

## 2024-09-06 DIAGNOSIS — G588 Other specified mononeuropathies: Principal | ICD-10-CM

## 2024-09-06 DIAGNOSIS — G9782 Other postprocedural complications and disorders of nervous system: Principal | ICD-10-CM

## 2024-09-06 DIAGNOSIS — Y842 Radiological procedure and radiotherapy as the cause of abnormal reaction of the patient, or of later complication, without mention of misadventure at the time of the procedure: Principal | ICD-10-CM

## 2024-09-20 DIAGNOSIS — C61 Malignant neoplasm of prostate: Principal | ICD-10-CM

## 2024-09-20 MED ORDER — OXYCODONE 5 MG TABLET
ORAL_TABLET | Freq: Two times a day (BID) | ORAL | 0 refills | 28.00000 days | Status: CP | PRN
Start: 2024-09-20 — End: ?

## 2024-10-09 ENCOUNTER — Ambulatory Visit
Admit: 2024-10-09 | Payer: Medicaid (Managed Care) | Attending: Student in an Organized Health Care Education/Training Program | Primary: Student in an Organized Health Care Education/Training Program

## 2024-10-11 ENCOUNTER — Ambulatory Visit
Admit: 2024-10-11 | Discharge: 2024-10-12 | Payer: Medicaid (Managed Care) | Attending: Student in an Organized Health Care Education/Training Program | Primary: Student in an Organized Health Care Education/Training Program

## 2024-10-11 DIAGNOSIS — G9782 Other postprocedural complications and disorders of nervous system: Principal | ICD-10-CM

## 2024-10-11 DIAGNOSIS — G588 Other specified mononeuropathies: Principal | ICD-10-CM

## 2024-10-11 DIAGNOSIS — G893 Neoplasm related pain (acute) (chronic): Principal | ICD-10-CM

## 2024-10-11 DIAGNOSIS — Y842 Radiological procedure and radiotherapy as the cause of abnormal reaction of the patient, or of later complication, without mention of misadventure at the time of the procedure: Principal | ICD-10-CM

## 2024-10-16 ENCOUNTER — Ambulatory Visit
Admit: 2024-10-16 | Discharge: 2024-10-17 | Payer: Medicaid (Managed Care) | Attending: Student in an Organized Health Care Education/Training Program | Primary: Student in an Organized Health Care Education/Training Program

## 2024-10-16 DIAGNOSIS — C61 Malignant neoplasm of prostate: Principal | ICD-10-CM

## 2024-10-16 DIAGNOSIS — G893 Neoplasm related pain (acute) (chronic): Principal | ICD-10-CM

## 2024-10-16 DIAGNOSIS — R530 Neoplastic (malignant) related fatigue: Principal | ICD-10-CM

## 2024-10-16 DIAGNOSIS — R634 Abnormal weight loss: Principal | ICD-10-CM

## 2024-10-16 DIAGNOSIS — K5903 Drug induced constipation: Principal | ICD-10-CM

## 2024-10-16 DIAGNOSIS — Z515 Encounter for palliative care: Principal | ICD-10-CM

## 2024-10-16 MED ORDER — BUPRENORPHINE 10 MCG/HOUR WEEKLY TRANSDERMAL PATCH
MEDICATED_PATCH | TRANSDERMAL | 1 refills | 28.00000 days | Status: CP
Start: 2024-10-16 — End: ?

## 2024-10-16 MED ORDER — NAPROXEN 500 MG TABLET
ORAL_TABLET | Freq: Two times a day (BID) | ORAL | 0 refills | 5.00000 days | Status: CP
Start: 2024-10-16 — End: 2024-10-21

## 2024-10-16 MED ORDER — OXYCODONE 5 MG TABLET
ORAL_TABLET | Freq: Three times a day (TID) | ORAL | 0 refills | 28.00000 days | Status: CP | PRN
Start: 2024-10-16 — End: ?

## 2024-10-16 MED ORDER — SENNOSIDES 8.6 MG TABLET
ORAL_TABLET | Freq: Every evening | ORAL | 2 refills | 60.00000 days | Status: CP | PRN
Start: 2024-10-16 — End: 2025-10-16

## 2024-11-14 DIAGNOSIS — C61 Malignant neoplasm of prostate: Principal | ICD-10-CM

## 2024-11-14 MED ORDER — OXYCODONE 5 MG TABLET
ORAL_TABLET | Freq: Three times a day (TID) | ORAL | 0 refills | 28.00000 days | PRN
Start: 2024-11-14 — End: ?

## 2024-11-14 MED ORDER — BUPRENORPHINE 10 MCG/HOUR WEEKLY TRANSDERMAL PATCH
MEDICATED_PATCH | TRANSDERMAL | 1 refills | 28.00000 days
Start: 2024-11-14 — End: ?

## 2024-11-15 ENCOUNTER — Ambulatory Visit
Admit: 2024-11-15 | Discharge: 2024-11-16 | Payer: Medicaid (Managed Care) | Attending: Student in an Organized Health Care Education/Training Program | Primary: Student in an Organized Health Care Education/Training Program

## 2024-11-15 DIAGNOSIS — Y842 Radiological procedure and radiotherapy as the cause of abnormal reaction of the patient, or of later complication, without mention of misadventure at the time of the procedure: Principal | ICD-10-CM

## 2024-11-15 DIAGNOSIS — G893 Neoplasm related pain (acute) (chronic): Principal | ICD-10-CM

## 2024-11-15 DIAGNOSIS — G9782 Other postprocedural complications and disorders of nervous system: Principal | ICD-10-CM

## 2024-11-15 DIAGNOSIS — G588 Other specified mononeuropathies: Principal | ICD-10-CM

## 2024-11-15 MED ORDER — OXYCODONE 5 MG TABLET
ORAL_TABLET | Freq: Three times a day (TID) | ORAL | 0 refills | 28.00000 days | Status: CP | PRN
Start: 2024-11-15 — End: ?

## 2024-11-15 MED ORDER — BUPRENORPHINE 10 MCG/HOUR WEEKLY TRANSDERMAL PATCH
MEDICATED_PATCH | TRANSDERMAL | 1 refills | 28.00000 days | Status: CP
Start: 2024-11-15 — End: ?

## 2024-11-21 ENCOUNTER — Ambulatory Visit: Admit: 2024-11-21 | Payer: Medicaid (Managed Care)

## 2024-12-14 DIAGNOSIS — C61 Malignant neoplasm of prostate: Principal | ICD-10-CM

## 2024-12-14 MED ORDER — OXYCODONE 5 MG TABLET
ORAL_TABLET | Freq: Three times a day (TID) | ORAL | 0 refills | 28.00000 days | Status: CP | PRN
Start: 2024-12-14 — End: ?

## 2024-12-20 ENCOUNTER — Ambulatory Visit
Admit: 2024-12-20 | Payer: Medicaid (Managed Care) | Attending: Student in an Organized Health Care Education/Training Program | Primary: Student in an Organized Health Care Education/Training Program

## 2024-12-22 ENCOUNTER — Ambulatory Visit
Admit: 2024-12-22 | Discharge: 2024-12-23 | Payer: Medicaid (Managed Care) | Attending: Student in an Organized Health Care Education/Training Program | Primary: Student in an Organized Health Care Education/Training Program

## 2024-12-22 MED ORDER — BUPRENORPHINE 10 MCG/HOUR WEEKLY TRANSDERMAL PATCH
MEDICATED_PATCH | TRANSDERMAL | 2 refills | 28.00000 days | Status: CP
Start: 2024-12-22 — End: ?
